# Patient Record
Sex: Female | Born: 1945 | Race: White | Hispanic: No | Marital: Married | Smoking: Never smoker
Health system: Southern US, Community
[De-identification: ages and names within clinical notes are randomized; demographics above are authoritative.]

## PROBLEM LIST (undated history)

## (undated) DIAGNOSIS — F32A Depression, unspecified: Secondary | ICD-10-CM

## (undated) DIAGNOSIS — Z87442 Personal history of urinary calculi: Secondary | ICD-10-CM

## (undated) DIAGNOSIS — I4891 Unspecified atrial fibrillation: Secondary | ICD-10-CM

## (undated) DIAGNOSIS — F329 Major depressive disorder, single episode, unspecified: Secondary | ICD-10-CM

## (undated) DIAGNOSIS — M8580 Other specified disorders of bone density and structure, unspecified site: Secondary | ICD-10-CM

## (undated) DIAGNOSIS — M169 Osteoarthritis of hip, unspecified: Secondary | ICD-10-CM

## (undated) DIAGNOSIS — N200 Calculus of kidney: Secondary | ICD-10-CM

## (undated) DIAGNOSIS — E559 Vitamin D deficiency, unspecified: Secondary | ICD-10-CM

## (undated) DIAGNOSIS — K219 Gastro-esophageal reflux disease without esophagitis: Secondary | ICD-10-CM

## (undated) DIAGNOSIS — I10 Essential (primary) hypertension: Secondary | ICD-10-CM

## (undated) DIAGNOSIS — B029 Zoster without complications: Secondary | ICD-10-CM

## (undated) DIAGNOSIS — D649 Anemia, unspecified: Secondary | ICD-10-CM

## (undated) DIAGNOSIS — K5790 Diverticulosis of intestine, part unspecified, without perforation or abscess without bleeding: Secondary | ICD-10-CM

## (undated) HISTORY — PX: KNEE SURGERY: SHX244

## (undated) HISTORY — DX: Other specified disorders of bone density and structure, unspecified site: M85.80

## (undated) HISTORY — PX: ABDOMINAL HYSTERECTOMY: SHX81

## (undated) HISTORY — DX: Zoster without complications: B02.9

## (undated) HISTORY — DX: Depression, unspecified: F32.A

## (undated) HISTORY — DX: Major depressive disorder, single episode, unspecified: F32.9

## (undated) HISTORY — DX: Vitamin D deficiency, unspecified: E55.9

## (undated) HISTORY — PX: KNEE ARTHROSCOPY WITH EXCISION BAKER'S CYST: SHX5646

## (undated) HISTORY — PX: OTHER SURGICAL HISTORY: SHX169

## (undated) HISTORY — DX: Calculus of kidney: N20.0

## (undated) HISTORY — DX: Diverticulosis of intestine, part unspecified, without perforation or abscess without bleeding: K57.90

## (undated) HISTORY — DX: Osteoarthritis of hip, unspecified: M16.9

---

## 1983-07-06 HISTORY — PX: BREAST REDUCTION SURGERY: SHX8

## 1999-12-01 ENCOUNTER — Encounter (INDEPENDENT_AMBULATORY_CARE_PROVIDER_SITE_OTHER): Payer: Self-pay | Admitting: Specialist

## 1999-12-01 ENCOUNTER — Other Ambulatory Visit: Admission: RE | Admit: 1999-12-01 | Discharge: 1999-12-01 | Payer: Self-pay | Admitting: *Deleted

## 1999-12-17 ENCOUNTER — Encounter (INDEPENDENT_AMBULATORY_CARE_PROVIDER_SITE_OTHER): Payer: Self-pay | Admitting: Specialist

## 1999-12-17 ENCOUNTER — Inpatient Hospital Stay (HOSPITAL_COMMUNITY): Admission: RE | Admit: 1999-12-17 | Discharge: 1999-12-19 | Payer: Self-pay | Admitting: *Deleted

## 2002-06-28 ENCOUNTER — Emergency Department (HOSPITAL_COMMUNITY): Admission: EM | Admit: 2002-06-28 | Discharge: 2002-06-29 | Payer: Self-pay | Admitting: Emergency Medicine

## 2003-07-06 HISTORY — PX: COLONOSCOPY: SHX174

## 2004-07-31 ENCOUNTER — Ambulatory Visit (HOSPITAL_COMMUNITY): Admission: RE | Admit: 2004-07-31 | Discharge: 2004-07-31 | Payer: Self-pay | Admitting: Gastroenterology

## 2005-10-04 ENCOUNTER — Emergency Department (HOSPITAL_COMMUNITY): Admission: EM | Admit: 2005-10-04 | Discharge: 2005-10-04 | Payer: Self-pay | Admitting: Emergency Medicine

## 2005-10-06 ENCOUNTER — Ambulatory Visit (HOSPITAL_BASED_OUTPATIENT_CLINIC_OR_DEPARTMENT_OTHER): Admission: RE | Admit: 2005-10-06 | Discharge: 2005-10-06 | Payer: Self-pay | Admitting: Urology

## 2005-10-07 ENCOUNTER — Ambulatory Visit (HOSPITAL_COMMUNITY): Admission: RE | Admit: 2005-10-07 | Discharge: 2005-10-07 | Payer: Self-pay | Admitting: Urology

## 2006-07-05 DIAGNOSIS — N200 Calculus of kidney: Secondary | ICD-10-CM

## 2006-07-05 HISTORY — PX: LITHOTRIPSY: SUR834

## 2006-07-05 HISTORY — DX: Calculus of kidney: N20.0

## 2008-10-24 ENCOUNTER — Encounter: Admission: RE | Admit: 2008-10-24 | Discharge: 2008-10-24 | Payer: Self-pay | Admitting: Family Medicine

## 2010-01-06 ENCOUNTER — Encounter: Admission: RE | Admit: 2010-01-06 | Discharge: 2010-01-06 | Payer: Self-pay | Admitting: Family Medicine

## 2010-11-20 NOTE — Op Note (Signed)
NAMEMATTI, KILLINGSWORTH            ACCOUNT NO.:  [id number]   MEDICAL RECORD NO.:  [id number]          PATIENT TYPE:  AMB   LOCATION:  DAY                          FACILITY:  [facility]   PHYSICIAN:  Bertram Millard. Dahlstedt, M.D.DATE OF BIRTH:  [birth year]   DATE OF PROCEDURE:  10/06/2005  DATE OF DISCHARGE:                                 OPERATIVE REPORT   PRE AND POSTOPERATIVE DIAGNOSES:  Left ureteral stone (upper ureter) with  significant pain, nausea, vomiting.   PRINCIPAL PROCEDURES:  Cystoscopy, left double-J stent.   SURGEON:  Bertram Millard. Dahlstedt, M.D.   ANESTHESIA:  General.   COMPLICATIONS:  None.   BRIEF HISTORY:  This 65 year old lady presented to me earlier this week with  about a month long history of intermittent left flank pain.  She has had  debilitating pain today.  She is scheduled for lithotripsy tomorrow but is  unable to keep food or fluids down.  It was recommended she undergo cysto  and stent placement to relieve her pain.  She presents emergently for this  procedure.   DESCRIPTION OF PROCEDURE:  The patient administered Demerol and Phenergan in  the office.  She was identified properly in the holding room, given  preoperative IV antibiotics and taken to the operating room where general  anesthetic was administered.  She was placed in the dorsal lithotomy  position.  Genitalia and perineum were prepped and draped.  A 22-French  panendoscope was placed into her bladder.  A sensor tip guidewire was then  advanced past the stone which was evident on fluoroscopy.  A double-J stent  (6 x 24 cm Contour stent) was then placed over the guidewire.  Good proximal  distal curls were seen after the guidewire was removed.  String was left  intact in the stent.  The bladder was drained, scope removed, the procedure  terminated.   The stent will be placed temporarily.  It will be removed and the near  future.  Was placed specifically to relieve the patient's pain, nausea  and  vomiting before her lithotripsy.      Bertram Millard. Dahlstedt, M.D.  Electronically Signed     SMD/MEDQ  D:  10/06/2005  T:  10/07/2005  Job:  [id number]

## 2010-11-20 NOTE — Discharge Summary (Signed)
[facility]  Patient:    Colleen Reynolds, Colleen Reynolds                   MRN: [id number] Adm. Date:  [id number] Disc. Date: [id number] Attending:  Donne Hazel                           Discharge Summary  HISTORY OF PRESENT ILLNESS:  For complete details of the history and physical, please see the clinic admission history and physical.  Briefly, the patient is  65 year old postmenopausal female admitted for total vaginal hysterectomy due to abnormal uterine bleeding causing severe anemia recently.  The patient was placed on Provera hormonal treatment for this with minimal success.  She wished to proceed with an operative cure.  Her admission is significant for anemia upon anemia. his has been explained to the patient.  Her admission hemoglobin is 7.9.  PAST MEDICAL AND SURGICAL HISTORY:  Please see the clinic admission history and  physical.  ADMITTING DIAGNOSES: 1. Abnormal uterine bleeding. 2. Postmenopausal. 3. History of deep venous thrombosis. 4. Anemia.  HOSPITAL COURSE:  The patient was admitted on the same day of surgery, December 17, 1999, where she underwent a total vaginal hysterectomy and bilateral salpingo-oophorectomy.  The surgery went well with 150 cc of blood loss.  The uterus was somewhat enlarged, consistent with uterine fibroids.  The ovaries were normal.  The surgery went well and no complications occurred.  Blood loss was 150 cc, as mentioned.  The patients postoperative course was uneventful.  Her hemoglobin did drop to 6.5, but she was relatively asymptomatic with this degree of anemia.  She was slowly  advanced to a regular diet and quickly resumed normal bowel and bladder function after her surgery.  The patient met all expected goals in the postoperative period and was discharged on postoperative day #2 in stable condition.  Her anemia and  postoperative recovery was discussed with her at length.  DISCHARGE  DIAGNOSED: 1. Abnormal uterine bleeding status post total vaginal hysterectomy and bilateral    salpingo-oophorectomy. 2. Anemia, stable but significant.  PLAN: 1. Nu-Iron 150, one p.o. b.i.d. 2. Prescription given for Tylox for pain. 3. Motrin p.r.n. 4. Routine postoperative instructions given. 5. The patient will follow up in one week in the office for a check up and we will    check her blood count at that time. DD:  01/05/00 TD:  01/05/00 Job: [id number]

## 2010-11-20 NOTE — Op Note (Signed)
Colleen Reynolds, HESLIN            ACCOUNT NO.:  [id number]   MEDICAL RECORD NO.:  [id number]          PATIENT TYPE:  AMB   LOCATION:  ENDO                         FACILITY:  [facility]   PHYSICIAN:  John C. Madilyn Fireman, M.D.    DATE OF BIRTH:  09/09/1945   DATE OF PROCEDURE:  07/31/2004  DATE OF DISCHARGE:                                 OPERATIVE REPORT   PROCEDURE:  Colonoscopy.   ENDOSCOPIST:  Everardo All. Madilyn Fireman, M.D.   INDICATIONS FOR PROCEDURE:  Average-risk colon cancer screening.   PROCEDURE:  The patient was placed in the left lateral decubitus position  and placed on the pulse monitor with continuous low-flow oxygen delivered by  nasal cannula. She was sedated 75 mcg IV fentanyl and 8 mg IV Versed. The  Olympus video colonoscope was inserted into the rectum and advanced to  cecum, confirmed by transillumination of McBurney's point and visualization  of ileocecal valve and appendiceal orifice. Prep was good. The cecum,  ascending, transverse, descending and sigmoid colon all appeared normal with  no masses, polyps, diverticula or other mucosal abnormalities. The rectum  likewise appeared normal. In retroflexed view, the anus revealed no obvious  internal hemorrhoids. The scope was then withdrawn and the patient returned  to the recovery room in stable condition. She tolerated the procedure well  and there were no immediate complications.   IMPRESSION:  Normal colonoscopy.   PLAN:  Next colon screening by sigmoidoscopy in 5 years      JCH/MEDQ  D:  07/31/2004  T:  07/31/2004  Job:  [id number]   cc:   Dellis Anes. Idell Pickles, M.D.  [address]  [place]  [place] [address]  Fax: [phone]

## 2010-11-20 NOTE — Op Note (Signed)
[facility]  Patient:    Colleen Reynolds, Colleen Reynolds                   MRN: [id number] Proc. Date: 12/17/99 Adm. Date:  [id number] Attending:  Donne Hazel                           Operative Report  PREOPERATIVE DIAGNOSIS: 1. Abnormal uterine bleeding. 2. Postmenopausal.  POSTOPERATIVE DIAGNOSIS: 1. Abnormal uterine bleeding. 2. Postmenopausal. 3. Uterine fibroids noted.  PROCEDURE:  Total vaginal hysterectomy with bilateral salpingo-oophorectomy.  SURGEON:  Willey Blade, M.D.  ASSISTANT:  Raynald Kemp, M.D.  ANESTHESIA:  General endotracheal.  ESTIMATED BLOOD LOSS:  150 cc.  COMPLICATIONS:  None.  FINDINGS:  At the time of hysterectomy, an enlarged uterus consistent with uterine fibroids were noted. This was about 6 weeks in size. The ovaries were visualized and noted to be normal postmenopausal, however, these were removed.  The surgery went without complications.  DESCRIPTION OF PROCEDURE:  The patient was taken to the operating room where an epidural anesthetic was administered. The patient was placed on the operating table in the dorsal lithotomy position, the perineum and vagina were prepped and draped in the usual sterile fashion with Betadine and sterile drapes. A weighted speculum was placed in the posterior fornix of the vagina and the anterior lip of the cervix was grasped with a Jacobs tenaculum. The posterior cul-de-sac was sharply entered. Next, the uterosacral ligaments were clamped bilaterally with curved Heaney clamps. The vascular pedicle was sharply created and suture ligated with a transfixing suture of #0 Vicryl. A circumferential incision was made around the anterior portion of the cervix and the bladder flap was bluntly and sharply created over the lower uterine segment. This was advanced cephalad. A curved Deaver retractor was placed behind the bladder to ensure its protecting during the procedure. Successive bites  were then carried up the body of the uterus using curved Heaney clamps. All vascular pedicles were carefully created close to the uterine body. All vascular pedicles were sharply created and suture ligated with #0 monocryl suture. This dissection was carried out including the uterine artery pedicles and dissection was carried out up the broad ligament. The uterine fundus was eventually encountered. The anterior cul-de-sac was entered sharply and once again a Deaver was placed and used to protect the bladder during the procedure.  The uterine fundus was then delivered posteriorly through the incision with a Lahey tenaculum. The uterine ovarian ligaments were clamped and the uterus dissected free. The ovaries were then removed in a systematic fashion. First the right ovary was removed, this was grasped with a Babcock clamp and with traction delivered into the incision. The infundibulopelvic ligament was then clamped with a curved Heaney clamp. The ovary dissected free. The infundibulopelvic ligament was doubly ligated first with a free tie of #0 monocryl. A transfixing suture of #0 monocryl was placed after this. The same procedure was repeated upon the right tube. All vascular pedicles were visualized and noted to be hemostatic. The posterior cuff of the vagina was run and closed with a running stitch of #0 monocryl.  Good hemostasis was once again noted. Attention was then turned to closure of the incision. The uterosacral ligaments were plicated in the midline with a transfixing suture of #0 Vicryl and this was done by grasping the left uterosacral and incorporating the posterior peritoneum overlying the rectal area and incorporating the right  uterosacral and then tying these together. This therefore obliterated the cul-de-sac. The two existing ligatures of #0 Vicryl on the uterosacral ligaments were then tied together to obliterate the posterior cul-de-sac and also for cuff support. The  vaginal cuff was then closed with multiple figure-of-eight sutures of #0 monocryl. Good hemostasis was noted. A Foley catheter was inserted with clear fluid and clear urine obtained. Blood loss was 150 cc. There were no perioperative complications. The patient did receive 1 gm of Cefotan prior to the procedure. She also received DVT prophylaxis in the form of PAS hose and heparin subcu. DD:  12/17/99 TD:  12/19/99 Job: [id number]

## 2014-12-17 ENCOUNTER — Other Ambulatory Visit: Payer: Self-pay | Admitting: Nurse Practitioner

## 2014-12-17 DIAGNOSIS — M810 Age-related osteoporosis without current pathological fracture: Secondary | ICD-10-CM

## 2014-12-17 DIAGNOSIS — M8580 Other specified disorders of bone density and structure, unspecified site: Secondary | ICD-10-CM

## 2014-12-24 ENCOUNTER — Ambulatory Visit
Admission: RE | Admit: 2014-12-24 | Discharge: 2014-12-24 | Disposition: A | Discharge status: Home / Self Care | Payer: Medicare Other | Source: Ambulatory Visit | Attending: Nurse Practitioner | Admitting: Nurse Practitioner

## 2014-12-24 DIAGNOSIS — M8580 Other specified disorders of bone density and structure, unspecified site: Secondary | ICD-10-CM

## 2016-09-15 DIAGNOSIS — R7303 Prediabetes: Secondary | ICD-10-CM | POA: Diagnosis not present

## 2016-09-15 DIAGNOSIS — F331 Major depressive disorder, recurrent, moderate: Secondary | ICD-10-CM | POA: Diagnosis not present

## 2016-09-15 DIAGNOSIS — E785 Hyperlipidemia, unspecified: Secondary | ICD-10-CM | POA: Diagnosis not present

## 2016-09-15 DIAGNOSIS — Z9189 Other specified personal risk factors, not elsewhere classified: Secondary | ICD-10-CM | POA: Diagnosis not present

## 2016-09-15 DIAGNOSIS — Z1212 Encounter for screening for malignant neoplasm of rectum: Secondary | ICD-10-CM | POA: Diagnosis not present

## 2016-09-15 DIAGNOSIS — E2839 Other primary ovarian failure: Secondary | ICD-10-CM | POA: Diagnosis not present

## 2016-09-15 DIAGNOSIS — Z8639 Personal history of other endocrine, nutritional and metabolic disease: Secondary | ICD-10-CM | POA: Diagnosis not present

## 2016-09-15 DIAGNOSIS — B0229 Other postherpetic nervous system involvement: Secondary | ICD-10-CM | POA: Diagnosis not present

## 2016-09-15 DIAGNOSIS — E559 Vitamin D deficiency, unspecified: Secondary | ICD-10-CM | POA: Diagnosis not present

## 2016-09-15 DIAGNOSIS — Z1231 Encounter for screening mammogram for malignant neoplasm of breast: Secondary | ICD-10-CM | POA: Diagnosis not present

## 2016-09-15 DIAGNOSIS — Z1211 Encounter for screening for malignant neoplasm of colon: Secondary | ICD-10-CM | POA: Diagnosis not present

## 2016-09-21 DIAGNOSIS — B0230 Zoster ocular disease, unspecified: Secondary | ICD-10-CM | POA: Diagnosis not present

## 2016-09-21 DIAGNOSIS — H2512 Age-related nuclear cataract, left eye: Secondary | ICD-10-CM | POA: Diagnosis not present

## 2016-09-21 DIAGNOSIS — H16302 Unspecified interstitial keratitis, left eye: Secondary | ICD-10-CM | POA: Diagnosis not present

## 2016-09-28 DIAGNOSIS — B0230 Zoster ocular disease, unspecified: Secondary | ICD-10-CM | POA: Diagnosis not present

## 2016-09-28 DIAGNOSIS — H16302 Unspecified interstitial keratitis, left eye: Secondary | ICD-10-CM | POA: Diagnosis not present

## 2016-10-04 DIAGNOSIS — Z1212 Encounter for screening for malignant neoplasm of rectum: Secondary | ICD-10-CM | POA: Diagnosis not present

## 2016-10-04 DIAGNOSIS — Z1211 Encounter for screening for malignant neoplasm of colon: Secondary | ICD-10-CM | POA: Diagnosis not present

## 2016-10-12 DIAGNOSIS — H16302 Unspecified interstitial keratitis, left eye: Secondary | ICD-10-CM | POA: Diagnosis not present

## 2016-10-15 ENCOUNTER — Encounter: Payer: Self-pay | Admitting: Gastroenterology

## 2016-10-20 DIAGNOSIS — Z9189 Other specified personal risk factors, not elsewhere classified: Secondary | ICD-10-CM | POA: Diagnosis not present

## 2016-10-20 DIAGNOSIS — E2839 Other primary ovarian failure: Secondary | ICD-10-CM | POA: Diagnosis not present

## 2016-10-20 DIAGNOSIS — M85852 Other specified disorders of bone density and structure, left thigh: Secondary | ICD-10-CM | POA: Diagnosis not present

## 2016-10-20 DIAGNOSIS — Z1231 Encounter for screening mammogram for malignant neoplasm of breast: Secondary | ICD-10-CM | POA: Diagnosis not present

## 2016-11-03 DIAGNOSIS — H16302 Unspecified interstitial keratitis, left eye: Secondary | ICD-10-CM | POA: Diagnosis not present

## 2016-11-05 ENCOUNTER — Ambulatory Visit (AMBULATORY_SURGERY_CENTER): Payer: Self-pay | Admitting: *Deleted

## 2016-11-05 VITALS — Ht 66.0 in | Wt 178.6 lb

## 2016-11-05 DIAGNOSIS — R195 Other fecal abnormalities: Secondary | ICD-10-CM

## 2016-11-05 MED ORDER — NA SULFATE-K SULFATE-MG SULF 17.5-3.13-1.6 GM/177ML PO SOLN: 1.0000 | Freq: Once | ORAL | 0 refills | Status: AC

## 2016-11-05 MED ORDER — NA SULFATE-K SULFATE-MG SULF 17.5-3.13-1.6 GM/177ML PO SOLN: 1.0000 | Freq: Once | ORAL | 0 refills | Status: DC

## 2016-11-05 NOTE — Progress Notes (Signed)
Denies allergies to eggs or soy products. Denies complications with sedation or anesthesia. Denies O2 use. Denies use of diet or weight loss medications.  Emmi instructions given for colonoscopy.  

## 2016-11-17 ENCOUNTER — Ambulatory Visit (AMBULATORY_SURGERY_CENTER): Payer: Medicare Other | Admitting: Gastroenterology

## 2016-11-17 ENCOUNTER — Encounter: Payer: Self-pay | Admitting: Gastroenterology

## 2016-11-17 VITALS — BP 112/72 | HR 94 | Temp 98.0°F | Resp 18 | Ht 66.0 in | Wt 178.0 lb

## 2016-11-17 DIAGNOSIS — D127 Benign neoplasm of rectosigmoid junction: Secondary | ICD-10-CM

## 2016-11-17 DIAGNOSIS — D122 Benign neoplasm of ascending colon: Secondary | ICD-10-CM

## 2016-11-17 DIAGNOSIS — Z1211 Encounter for screening for malignant neoplasm of colon: Secondary | ICD-10-CM | POA: Diagnosis not present

## 2016-11-17 DIAGNOSIS — R195 Other fecal abnormalities: Secondary | ICD-10-CM

## 2016-11-17 MED ORDER — SODIUM CHLORIDE 0.9 % IV SOLN: 500.0000 mL | INTRAVENOUS | Status: DC

## 2016-11-17 NOTE — Op Note (Signed)
[facility] Patient Name: Colleen Reynolds Procedure Date: 11/17/2016 9:12 AM MRN: [id number] Endoscopist: Mauri Pole , MD Age: 71 Referring MD:  Date of Birth: 09-24-1945 Gender: Female Account #: [id number] Procedure:                Colonoscopy Indications:              Positive Cologuard test Medicines:                Monitored Anesthesia Care Procedure:                Pre-Anesthesia Assessment:                           - Prior to the procedure, a History and Physical                            was performed, and patient medications and                            allergies were reviewed. The patient's tolerance of                            previous anesthesia was also reviewed. The risks                            and benefits of the procedure and the sedation                            options and risks were discussed with the patient.                            All questions were answered, and informed consent                            was obtained. Prior Anticoagulants: The patient has                            taken no previous anticoagulant or antiplatelet                            agents. ASA Grade Assessment: II - A patient with                            mild systemic disease. After reviewing the risks                            and benefits, the patient was deemed in                            satisfactory condition to undergo the procedure.                           After obtaining informed consent, the colonoscope  was passed under direct vision. Throughout the                            procedure, the patient's blood pressure, pulse, and                            oxygen saturations were monitored continuously. The                            Colonoscope was introduced through the anus and                            advanced to the the terminal ileum, with                            identification of the appendiceal  orifice and IC                            valve. The colonoscopy was performed without                            difficulty. The patient tolerated the procedure                            well. The quality of the bowel preparation was                            excellent. The ileocecal valve, appendiceal                            orifice, and rectum were photographed. Scope In: 9:20:29 AM Scope Out: 9:43:49 AM Scope Withdrawal Time: 0 hours 20 minutes 41 seconds  Total Procedure Duration: 0 hours 23 minutes 20 seconds  Findings:                 The perianal and digital rectal examinations were                            normal.                           Four sessile polyps were found in the ascending                            colon. The polyps were 1 to 3 mm in size. These                            polyps were removed with a cold biopsy forceps.                            Resection and retrieval were complete.                           A 18 mm polyp was found in the ascending colon. The  polyp was granular lateral spreading. The polyp was                            removed with a piecemeal technique using a cold                            snare. Resection and retrieval were complete.                           A 4 mm polyp was found in the recto-sigmoid colon.                            The polyp was sessile. The polyp was removed with a                            cold snare. Resection and retrieval were complete.                           Multiple small and large-mouthed diverticula were                            found in the sigmoid colon, descending colon,                            transverse colon and ascending colon.                           Non-bleeding internal hemorrhoids were found during                            retroflexion. The hemorrhoids were small. Complications:            No immediate complications. Estimated Blood Loss:     Estimated  blood loss was minimal. Impression:               - Four 1 to 3 mm polyps in the ascending colon,                            removed with a cold biopsy forceps. Resected and                            retrieved.                           - One 18 mm polyp in the ascending colon, removed                            piecemeal using a cold snare. Resected and                            retrieved.                           - One 4 mm polyp at the recto-sigmoid colon,  removed with a cold snare. Resected and retrieved.                           - Diverticulosis in the sigmoid colon, in the                            descending colon, in the transverse colon and in                            the ascending colon.                           - Non-bleeding internal hemorrhoids. Recommendation:           - Patient has a contact number available for                            emergencies. The signs and symptoms of potential                            delayed complications were discussed with the                            patient. Return to normal activities tomorrow.                            Written discharge instructions were provided to the                            patient.                           - Resume previous diet.                           - Continue present medications.                           - Await pathology results.                           - Repeat colonoscopy in 1 year for surveillance                            after piecemeal polypectomy. Mauri Pole, MD 11/17/2016 9:48:35 AM This report has been signed electronically.

## 2016-11-17 NOTE — Patient Instructions (Signed)
YOU HAD AN ENDOSCOPIC PROCEDURE TODAY AT [facility]:   Refer to the procedure report that was given to you for any specific questions about what was found during the examination.  If the procedure report does not answer your questions, please call your gastroenterologist to clarify.  If you requested that your care partner not be given the details of your procedure findings, then the procedure report has been included in a sealed envelope for you to review at your convenience later.  YOU SHOULD EXPECT: Some feelings of bloating in the abdomen. Passage of more gas than usual.  Walking can help get rid of the air that was put into your GI tract during the procedure and reduce the bloating. If you had a lower endoscopy (such as a colonoscopy or flexible sigmoidoscopy) you may notice spotting of blood in your stool or on the toilet paper. If you underwent a bowel prep for your procedure, you may not have a normal bowel movement for a few days.  Please Note:  You might notice some irritation and congestion in your nose or some drainage.  This is from the oxygen used during your procedure.  There is no need for concern and it should clear up in a day or so.  SYMPTOMS TO REPORT IMMEDIATELY:   Following lower endoscopy (colonoscopy or flexible sigmoidoscopy):  Excessive amounts of blood in the stool  Significant tenderness or worsening of abdominal pains  Swelling of the abdomen that is new, acute  Fever of 100F or higher   For urgent or emergent issues, a gastroenterologist can be reached at any hour by calling [phone].   DIET:  We do recommend a small meal at first, but then you may proceed to your regular diet.  Drink plenty of fluids but you should avoid alcoholic beverages for 24 hours.  ACTIVITY:  You should plan to take it easy for the rest of today and you should NOT DRIVE or use heavy machinery until tomorrow (because of the sedation medicines used during the test).     FOLLOW UP: Our staff will call the number listed on your records the next business day following your procedure to check on you and address any questions or concerns that you may have regarding the information given to you following your procedure. If we do not reach you, we will leave a message.  However, if you are feeling well and you are not experiencing any problems, there is no need to return our call.  We will assume that you have returned to your regular daily activities without incident.  If any biopsies were taken you will be contacted by phone or by letter within the next 1-3 weeks.  Please call us at [phone] if you have not heard about the biopsies in 3 weeks.    SIGNATURES/CONFIDENTIALITY: You and/or your care partner have signed paperwork which will be entered into your electronic medical record.  These signatures attest to the fact that that the information above on your After Visit Summary has been reviewed and is understood.  Full responsibility of the confidentiality of this discharge information lies with you and/or your care-partner.  Polyp, diverticulosis,  Hemorrhoid information given.  Recall colonoscopy 1 year-2019

## 2016-11-17 NOTE — Progress Notes (Signed)
Called to room to assist during endoscopic procedure.  Patient ID and intended procedure confirmed with present staff. Received instructions for my participation in the procedure from the performing physician.  

## 2016-11-17 NOTE — Progress Notes (Signed)
A and O x3. Report to RN. Tolerated MAC anesthesia well.

## 2016-11-18 ENCOUNTER — Telehealth: Payer: Self-pay | Admitting: *Deleted

## 2016-11-18 ENCOUNTER — Telehealth: Payer: Self-pay

## 2016-11-18 NOTE — Telephone Encounter (Signed)
  Follow up Call-  Call back number 11/17/2016  Post procedure Call Back phone  # [phone]  Permission to leave phone message Yes  Some recent data might be hidden     Patient questions:  Do you have a fever, pain , or abdominal swelling? No. Pain Score  0 *  Have you tolerated food without any problems? Yes.    Have you been able to return to your normal activities? Yes.    Do you have any questions about your discharge instructions: Diet   No. Medications  No. Follow up visit  No.  Do you have questions or concerns about your Care? No.  Actions: * If pain score is 4 or above: No action needed, pain <4.

## 2016-11-18 NOTE — Telephone Encounter (Signed)
  Follow up Call-  Call back number 11/17/2016  Post procedure Call Back phone  # [phone]  Permission to leave phone message Yes  Some recent data might be hidden     Left message

## 2016-11-23 ENCOUNTER — Encounter: Payer: Self-pay | Admitting: Gastroenterology

## 2016-12-22 DIAGNOSIS — B0230 Zoster ocular disease, unspecified: Secondary | ICD-10-CM | POA: Diagnosis not present

## 2016-12-22 DIAGNOSIS — H16302 Unspecified interstitial keratitis, left eye: Secondary | ICD-10-CM | POA: Diagnosis not present

## 2017-03-18 ENCOUNTER — Ambulatory Visit: Payer: Medicare Other | Admitting: Family Medicine

## 2017-03-25 ENCOUNTER — Ambulatory Visit (INDEPENDENT_AMBULATORY_CARE_PROVIDER_SITE_OTHER): Payer: Medicare Other | Admitting: Family Medicine

## 2017-03-25 ENCOUNTER — Encounter: Payer: Self-pay | Admitting: Family Medicine

## 2017-03-25 VITALS — BP 102/72 | HR 83 | Temp 98.8°F | Ht 69.8 in | Wt 176.4 lb

## 2017-03-25 DIAGNOSIS — Z23 Encounter for immunization: Secondary | ICD-10-CM | POA: Diagnosis not present

## 2017-03-25 DIAGNOSIS — H16302 Unspecified interstitial keratitis, left eye: Secondary | ICD-10-CM | POA: Diagnosis not present

## 2017-03-25 DIAGNOSIS — B0230 Zoster ocular disease, unspecified: Secondary | ICD-10-CM | POA: Diagnosis not present

## 2017-03-25 DIAGNOSIS — B0229 Other postherpetic nervous system involvement: Secondary | ICD-10-CM | POA: Diagnosis not present

## 2017-03-25 DIAGNOSIS — M85852 Other specified disorders of bone density and structure, left thigh: Secondary | ICD-10-CM

## 2017-03-25 DIAGNOSIS — M85851 Other specified disorders of bone density and structure, right thigh: Secondary | ICD-10-CM

## 2017-03-25 DIAGNOSIS — Z7689 Persons encountering health services in other specified circumstances: Secondary | ICD-10-CM

## 2017-03-25 DIAGNOSIS — F339 Major depressive disorder, recurrent, unspecified: Secondary | ICD-10-CM | POA: Diagnosis not present

## 2017-03-25 MED ORDER — FLUOXETINE HCL 40 MG PO CAPS: 40.0000 mg | ORAL_CAPSULE | Freq: Every day | ORAL | 3 refills | Status: DC

## 2017-03-25 NOTE — Patient Instructions (Addendum)
Dehydration, Adult Dehydration is a condition in which there is not enough fluid or water in the body. This happens when you lose more fluids than you take in. Important organs, such as the kidneys, brain, and heart, cannot function without a proper amount of fluids. Any loss of fluids from the body can lead to dehydration. Dehydration can range from mild to severe. This condition should be treated right away to prevent it from becoming severe. What are the causes? This condition may be caused by:  Vomiting.  Diarrhea.  Excessive sweating, such as from heat exposure or exercise.  Not drinking enough fluid, especially: ? When ill. ? While doing activity that requires a lot of energy.  Excessive urination.  Fever.  Infection.  Certain medicines, such as medicines that cause the body to lose excess fluid (diuretics).  Inability to access safe drinking water.  Reduced physical ability to get adequate water and food.  What increases the risk? This condition is more likely to develop in people:  Who have a poorly controlled long-term (chronic) illness, such as diabetes, heart disease, or kidney disease.  Who are age 65 or older.  Who are disabled.  Who live in a place with high altitude.  Who play endurance sports.  What are the signs or symptoms? Symptoms of mild dehydration may include:  Thirst.  Dry lips.  Slightly dry mouth.  Dry, warm skin.  Dizziness. Symptoms of moderate dehydration may include:  Very dry mouth.  Muscle cramps.  Dark urine. Urine may be the color of tea.  Decreased urine production.  Decreased tear production.  Heartbeat that is irregular or faster than normal (palpitations).  Headache.  Light-headedness, especially when you stand up from a sitting position.  Fainting (syncope). Symptoms of severe dehydration may include:  Changes in skin, such as: ? Cold and clammy skin. ? Blotchy (mottled) or pale skin. ? Skin that does  not quickly return to normal after being lightly pinched and released (poor skin turgor).  Changes in body fluids, such as: ? Extreme thirst. ? No tear production. ? Inability to sweat when body temperature is high, such as in hot weather. ? Very little urine production.  Changes in vital signs, such as: ? Weak pulse. ? Pulse that is more than 100 beats a minute when sitting still. ? Rapid breathing. ? Low blood pressure.  Other changes, such as: ? Sunken eyes. ? Cold hands and feet. ? Confusion. ? Lack of energy (lethargy). ? Difficulty waking up from sleep. ? Short-term weight loss. ? Unconsciousness. How is this diagnosed? This condition is diagnosed based on your symptoms and a physical exam. Blood and urine tests may be done to help confirm the diagnosis. How is this treated? Treatment for this condition depends on the severity. Mild or moderate dehydration can often be treated at home. Treatment should be started right away. Do not wait until dehydration becomes severe. Severe dehydration is an emergency and it needs to be treated in a hospital. Treatment for mild dehydration may include:  Drinking more fluids.  Replacing salts and minerals in your blood (electrolytes) that you may have lost. Treatment for moderate dehydration may include:  Drinking an oral rehydration solution (ORS). This is a drink that helps you replace fluids and electrolytes (rehydrate). It can be found at pharmacies and retail stores. Treatment for severe dehydration may include:  Receiving fluids through an IV tube.  Receiving an electrolyte solution through a feeding tube that is passed through your nose   and into your stomach (nasogastric tube, or NG tube).  Correcting any abnormalities in electrolytes.  Treating the underlying cause of dehydration. Follow these instructions at home:  If directed by your health care provider, drink an ORS: ? Make an ORS by following instructions on the  package. ? Start by drinking small amounts, about  cup (120 mL) every 5-10 minutes. ? Slowly increase how much you drink until you have taken the amount recommended by your health care provider.  Drink enough clear fluid to keep your urine clear or pale yellow. If you were told to drink an ORS, finish the ORS first, then start slowly drinking other clear fluids. Drink fluids such as: ? Water. Do not drink only water. Doing that can lead to having too little salt (sodium) in the body (hyponatremia). ? Ice chips. ? Fruit juice that you have added water to (diluted fruit juice). ? Low-calorie sports drinks.  Avoid: ? Alcohol. ? Drinks that contain a lot of sugar. These include high-calorie sports drinks, fruit juice that is not diluted, and soda. ? Caffeine. ? Foods that are greasy or contain a lot of fat or sugar.  Take over-the-counter and prescription medicines only as told by your health care provider.  Do not take sodium tablets. This can lead to having too much sodium in the body (hypernatremia).  Eat foods that contain a healthy balance of electrolytes, such as bananas, oranges, potatoes, tomatoes, and spinach.  Keep all follow-up visits as told by your health care provider. This is important. Contact a health care provider if:  You have abdominal pain that: ? Gets worse. ? Stays in one area (localizes).  You have a rash.  You have a stiff neck.  You are more irritable than usual.  You are sleepier or more difficult to wake up than usual.  You feel weak or dizzy.  You feel very thirsty.  You have urinated only a small amount of very dark urine over 6-8 hours. Get help right away if:  You have symptoms of severe dehydration.  You cannot drink fluids without vomiting.  Your symptoms get worse with treatment.  You have a fever.  You have a severe headache.  You have vomiting or diarrhea that: ? Gets worse. ? Does not go away.  You have blood or green matter  (bile) in your vomit.  You have blood in your stool. This may cause stool to look black and tarry.  You have not urinated in 6-8 hours.  You faint.  Your heart rate while sitting still is over 100 beats a minute.  You have trouble breathing. This information is not intended to replace advice given to you by your health care provider. Make sure you discuss any questions you have with your health care provider. Document Released: 06/21/2005 Document Revised: 01/16/2016 Document Reviewed: 08/15/2015 Elsevier Interactive Patient Education  2018 [employer].  Preventing Osteoporosis, Adult Osteoporosis is a condition that causes the bones to get weaker. With osteoporosis, the bones become thinner, and the normal spaces in bone tissue become larger. This can make the bones weak and cause them to break more easily. People who have osteoporosis are more likely to break their wrist, spine, or hip. Even a minor accident or injury can be enough to break weak bones. Osteoporosis can occur with aging. Your body constantly replaces old bone tissue with new tissue. As you get older, you may lose bone tissue more quickly, or it may be replaced more slowly. Osteoporosis is  more likely to develop if you have poor nutrition or do not get enough calcium or vitamin D. Other lifestyle factors can also play a role. By making some diet and lifestyle changes, you can help to keep your bones healthy and help to prevent osteoporosis. What nutrition changes can be made? Nutrition plays an important role in maintaining healthy, strong bones.  Make sure you get enough calcium every day from food or from calcium supplements. ? If you are age 12 or younger, aim to get 1,000 mg of calcium every day. ? If you are older than age 34, aim to get 1,200 mg of calcium every day.  Try to get enough vitamin D every day. ? If you are age 4 or younger, aim to get 600 international units (IU) every day. ? If you are older than age  92, aim to get 800 international units every day.  Follow a healthy diet. Eat plenty of foods that contain calcium and vitamin D. ? Calcium is in milk, cheese, yogurt, and other dairy products. Some fish and vegetables are also good sources of calcium. Many foods such as cereals and breads have had calcium added to them (are fortified). Check nutrition labels to see how much calcium is in a food or drink. ? Foods that contain vitamin D include milk, cereals, salmon, and tuna. Your body also makes vitamin D when you are out in the sun. Bare skin exposure to the sun on your face, arms, legs, or back for no more than 30 minutes a day, 2 times per week is more than enough. Beyond that, it is important to use sunblock to protect your skin from sunburn, which increases your risk for skin cancer.  What lifestyle changes can be made? Making changes in your everyday life can also play an important role in preventing osteoporosis.  Stay active and get exercise every day. Ask your health care provider what types of exercise are best for you.  Do not use any products that contain nicotine or tobacco, such as cigarettes and e-cigarettes. If you need help quitting, ask your health care provider.  Limit alcohol intake to no more than 1 drink a day for nonpregnant women and 2 drinks a day for men. One drink equals 12 oz of beer, 5 oz of wine, or 1 oz of hard liquor.  Why are these changes important? Making these nutrition and lifestyle changes can:  Help you develop and maintain healthy, strong bones.  Prevent loss of bone mass and the problems that are caused by that loss, such as broken bones and delayed healing.  Make you feel better mentally and physically.  What can happen if changes are not made? Problems that can result from osteoporosis can be very serious. These may include:  A higher risk of broken bones that are painful and do not heal well.  Physical malformations, such as a collapsed spine  or a hunched back.  Problems with movement.  Where to find support: If you need help making changes to prevent osteoporosis, talk with your health care provider. You can ask for a referral to a diet and nutrition specialist (dietitian) and a physical therapist. Where to find more information: Learn more about osteoporosis from:  NIH Osteoporosis and Related [place]: www.niams.GolfingGoddess.com.br  U.S. Office on Women's Health: SouvenirBaseball.es.html  National Osteoporosis Foundation: ProfilePeek.ch  Summary  Osteoporosis is a condition that causes weak bones that are more likely to break.  Eating a healthy diet  and making sure you get enough calcium and vitamin D can help prevent osteoporosis.  Other ways to reduce your risk of osteoporosis include getting regular exercise and avoiding alcohol and products that contain nicotine or tobacco. This information is not intended to replace advice given to you by your health care provider. Make sure you discuss any questions you have with your health care provider. Document Released: 07/06/2015 Document Revised: 03/01/2016 Document Reviewed: 03/01/2016 Elsevier Interactive Patient Education  [employer].

## 2017-03-25 NOTE — Progress Notes (Signed)
Patient presents to clinic today to establish care and f/u on chronic issues.  SUBJECTIVE: PMH: Pt is a 71 yo CF with pmh sig for osteopenia, post herpetic neuralgia, b/l hip pain, depression.  Pt was formerly seen by [facility] O[facility] on [place] by a provider who recently left.   Osteopenia: -recent bone density -on fosamax 35 mg weekly -also taking calcium, glucosamine plus vit D -typically exercises, but hasn't recently and notices a difference.  Depression: -On Prozac since 1998.  Now taking 60mg  daily  -Pt endorses mood being fine, but decreased energy. -states had a "rough couple of years", but is doing better. -Interested in decreasing dose of Prozac to 40mg .  Post herpetic neuralgia: -Dx'd w/ shingles in July 2017. -Was on L forehead and in L eye -Pt followed by Dr. Hillery Hunter, Optho.  Last appt was today.  Pt d/c'd. -Pt had a scar on L cornea causing some decreased vision and light sensitivity.  Allergies: Statin (lovastatin)-tolerate it at first. Then had decreased interest, fatigue Penicillin-rash Clindamycin-vomiting  Surgical history: 1967 right knee patellar tendon tack Tonsillectomy Tubal ligation 2001 complete hysterectomy 2008 lithotripsy Breast reduction  Social history: Patient has been married for 40 years. She has 2 kids, one grandkid. One child lives here in the area. Patient denies tobacco, alcohol, drug use.  Health Maintenance: Dental --2 years ago, [facility] --today John Scott/Christopher Manuella Ghazi Immunizations --Prevnar December 2017, influenza 2018 Colonoscopy --done in April/May 2018, 6 benign polyps removed-repeat in one year Mammogram --last spring PAP -- complete hysterectomy in 2001. Bone Density -- recent-history of osteopenia  Family medical history: Mom-deceased, arthritis Dad-deceased CVA, osteoporosis, psoriasis Sis Charlotte-AAW, 75 mononucleosis, osteoporosis Sis Anne- desc. Obese, HPV in throat Sis Romie Minus- AAW,  osteoporosis Sister Mable-AAW Daughter Meredith-migraines, anxiety Daughter Melanie-AAW MGM-dementia, arthritis MGF-MI PGM- CVA, dementia PGF-liver issues possible cancer  Past Medical History:  Diagnosis Date  . Depression   . Diverticulosis   . Kidney stones 2008  . OA (osteoarthritis) of hip   . Osteopenia   . Shingles   . Vitamin D deficiency     Past Surgical History:  Procedure Laterality Date  . ABDOMINAL HYSTERECTOMY    . BREAST REDUCTION SURGERY  1985  . COLONOSCOPY  2005  . LITHOTRIPSY  2008    Current Outpatient Prescriptions on File Prior to Visit  Medication Sig Dispense Refill  . acyclovir (ZOVIRAX) 800 MG tablet Take 1,000 mg by mouth daily.    Marland Kitchen alendronate (FOSAMAX) 35 MG tablet 1 TAB EVERY 7 DAYS. IN A.M. W/FULL GLASS OF WATER,EMPTY STOMACH, DO NOT EAT/DRINK/LAY DOWN FOR 30MIN    . CALCIUM PO Take 400 mg by mouth 2 (two) times daily.    Marland Kitchen FAMOTIDINE PO Take by mouth.    . fexofenadine (ALLEGRA) 180 MG tablet Take by mouth.    Marland Kitchen FLUoxetine (PROZAC) 20 MG capsule Take by mouth.    . prednisoLONE acetate (PREDNISOLONE ACETATE P-F) 1 % ophthalmic suspension Place 1 drop into the left eye daily.    . Red Yeast Rice 600 MG CAPS Take 1,200 mg by mouth.    . cyclobenzaprine (FLEXERIL) 5 MG tablet Take 5 mg by mouth 3 (three) times daily as needed for muscle spasms.     Current Facility-Administered Medications on File Prior to Visit  Medication Dose Route Frequency Provider Last Rate Last Dose  . 0.9 %  sodium chloride infusion  500 mL Intravenous Continuous Nandigam, Venia Minks, MD  Allergies  Allergen Reactions  . Clindamycin/Lincomycin Nausea And Vomiting  . Lovastatin Other (See Comments)    Fatigue, muscle aches  . Penicillins     Family History  Problem Relation Age of Onset  . Colon cancer Neg Hx   . Esophageal cancer Neg Hx   . Rectal cancer Neg Hx   . Stomach cancer Neg Hx     Social History   Social History  . Marital status:  Married    Spouse name: N/A  . Number of children: N/A  . Years of education: N/A   Occupational History  . Not on file.   Social History Main Topics  . Smoking status: Never Smoker  . Smokeless tobacco: Never Used  . Alcohol use Yes     Comment: socially  . Drug use: No  . Sexual activity: Not on file   Other Topics Concern  . Not on file   Social History Narrative  . No narrative on file    ROS  General: Denies fever, chills, night sweats, changes in weight, changes in appetite HEENT: Denies headaches, ear pain, changes in vision, rhinorrhea, sore throat CV: Denies CP, palpitations, SOB, orthopnea Pulm: Denies SOB, cough, wheezing GI: Denies abdominal pain, nausea, vomiting, diarrhea, constipation GU: Denies dysuria, hematuria, frequency, vaginal discharge  +h/o kidney stones. Msk: Denies muscle cramps, joint pains Neuro: Denies weakness, numbness, tingling  +pain in face s/p shingles, b/l hip pain. Skin: Denies rashes, bruising Psych: Denies depression, anxiety, hallucinations   BP 102/72 (BP Location: Right Arm, Patient Position: Sitting, Cuff Size: Normal)   Pulse 83   Temp 98.8 F (37.1 C) (Oral)   Ht 5' 9.8" (1.773 m)   Wt 176 lb 6.4 oz (80 kg)   BMI 25.46 kg/m   Physical Exam  Gen. Pleasant, well developed, well-nourished, in NAD HEENT - [place]/AT, PERRL, EOMI, no scleral icterus, no nasal drainage, pharynx without erythema or exudate. Neck: No JVD, no thyromegaly, no carotid bruits Lungs: no accessory muscle use, CTAB, no wheezes, rales or rhonchi Cardiovascular: RRR, No r/g/m, no peripheral edema Abdomen: BS present, soft, nontender,nondistended, no hepatosplenomegaly Musculoskeletal: No deformities, moves all four extremities, no cyanosis or clubbing, normal tone Neuro:  A&Ox3, CN II-XII intact, normal gait Skin:  Warm, dry, intact, no lesions Psych: normal affect, mood appropriate  No results found for this or any previous visit (from the past 2160  hour(s)).  Assessment/Plan: Depression, recurrent ([place]) -currently stable -Given PHQ9 - Plan: FLUoxetine (PROZAC) 40 MG capsule  Post herpetic neuralgia -Continue acyclovir indefinitely per ophthalmology -Followed by Dr. Jenny Reichmann Scott/Christopher Manuella Ghazi no follow-up needed per appointment today -Continue wearing sunglasses when outdoors.  Osteopenia of both hips -Continue daily intake of dairy -Continue Fosamax 35 mg weekly -Continue glucosamine-vitamin D and calcium  Need for influenza vaccination  - Plan: Flu vaccine HIGH DOSE PF  Encounter to establish care -Records release -Patient arrives copy of recent lab was scanned into chart. CBC and CMP normal.  Hemoglobin A1c 5.4% and vitamin D 32.2 results from 09/15/2016.   Follow-up prn.  F/u in 3 months for depression.

## 2017-06-02 DIAGNOSIS — H2511 Age-related nuclear cataract, right eye: Secondary | ICD-10-CM | POA: Diagnosis not present

## 2017-06-03 ENCOUNTER — Encounter: Payer: Self-pay | Admitting: Family Medicine

## 2017-06-06 ENCOUNTER — Encounter: Payer: Self-pay | Admitting: Family Medicine

## 2017-06-08 DIAGNOSIS — H43813 Vitreous degeneration, bilateral: Secondary | ICD-10-CM | POA: Diagnosis not present

## 2017-09-19 DIAGNOSIS — Z0100 Encounter for examination of eyes and vision without abnormal findings: Secondary | ICD-10-CM | POA: Diagnosis not present

## 2017-09-19 DIAGNOSIS — Z0101 Encounter for examination of eyes and vision with abnormal findings: Secondary | ICD-10-CM | POA: Diagnosis not present

## 2017-10-06 ENCOUNTER — Encounter: Payer: Self-pay | Admitting: Family Medicine

## 2017-10-06 ENCOUNTER — Ambulatory Visit (INDEPENDENT_AMBULATORY_CARE_PROVIDER_SITE_OTHER): Payer: Medicare HMO | Admitting: Family Medicine

## 2017-10-06 VITALS — BP 124/76 | HR 136 | Temp 98.4°F | Wt 182.2 lb

## 2017-10-06 DIAGNOSIS — R000 Tachycardia, unspecified: Secondary | ICD-10-CM

## 2017-10-06 DIAGNOSIS — J45909 Unspecified asthma, uncomplicated: Secondary | ICD-10-CM | POA: Diagnosis not present

## 2017-10-06 MED ORDER — FLUTICASONE PROPIONATE 50 MCG/ACT NA SUSP: 1.0000 | Freq: Every day | NASAL | 1 refills | Status: DC

## 2017-10-06 MED ORDER — PREDNISONE 20 MG PO TABS: 40.0000 mg | ORAL_TABLET | Freq: Every day | ORAL | 0 refills | Status: AC

## 2017-10-06 MED ORDER — ALBUTEROL SULFATE HFA 108 (90 BASE) MCG/ACT IN AERS: 2.0000 | INHALATION_SPRAY | Freq: Four times a day (QID) | RESPIRATORY_TRACT | 0 refills | Status: DC | PRN

## 2017-10-06 NOTE — Progress Notes (Signed)
Subjective:    Patient ID: Colleen Reynolds, female    DOB: [birth year], 72 y.o.   MRN: [id number]  Chief Complaint  Patient presents with  . Cough    HPI Patient was seen today for acute concern.  Pt states while doing yard work she was blowing debris underneath the deck when dust and dirt blew up in her face.  Since then pt endorses 6 days of cough, headache, subjective fever (now resolved). Pt now feels like she is having more congestion and difficulty sleeping 2/2 cough.   Pt has not taken anything for her symptoms.    Past Medical History:  Diagnosis Date  . Depression   . Diverticulosis   . Kidney stones 2008  . OA (osteoarthritis) of hip   . Osteopenia   . Shingles   . Vitamin D deficiency     Allergies  Allergen Reactions  . Clindamycin/Lincomycin Nausea And Vomiting  . Lovastatin Other (See Comments)    Fatigue, muscle aches  . Penicillins     ROS General: Denies fever, chills, night sweats, changes in weight, changes in appetite HEENT: Denies headaches, ear pain, changes in vision, rhinorrhea, sore throat  +nasal congestion CV: Denies CP, palpitations, SOB, orthopnea Pulm: Denies SOB, wheezing  +cough, congestion GI: Denies abdominal pain, nausea, vomiting, diarrhea, constipation GU: Denies dysuria, hematuria, frequency, vaginal discharge Msk: Denies muscle cramps, joint pains Neuro: Denies weakness, numbness, tingling Skin: Denies rashes, bruising Psych: Denies depression, anxiety, hallucinations     Objective:    Blood pressure 124/76, pulse (!) 136, temperature 98.4 F (36.9 C), temperature source Oral, weight 182 lb 3.2 oz (82.6 kg), SpO2 96 %.   Gen. Pleasant, well-nourished, in no distress, normal affect   HEENT: [place]/AT, face symmetric, no scleral icterus, PERRLA, nares patent without drainage, pharynx without erythema or exudate.  TMs full bilaterally.  No cervical lymphadenopathy. Lungs: Productive sounding cough, no accessory muscle use, scattered  wheezes, no rales Cardiovascular: tachycardia, no m/r/g, no peripheral edema Abdomen: BS present, soft, NT/ND Neuro:  A&Ox3, CN II-XII intact, normal gait    Wt Readings from Last 3 Encounters:  10/06/17 182 lb 3.2 oz (82.6 kg)  03/25/17 176 lb 6.4 oz (80 kg)  11/17/16 178 lb (80.7 kg)    No results found for: WBC, HGB, HCT, PLT, GLUCOSE, CHOL, TRIG, HDL, LDLDIRECT, LDLCALC, ALT, AST, NA, K, CL, CREATININE, BUN, CO2, TSH, PSA, INR, GLUF, HGBA1C, MICROALBUR  Assessment/Plan:  Bronchitis, allergic, unspecified asthma severity, uncomplicated -Discussed dust/debris likely cause irritation to patient's lungs. -Discussed continuing fexofenadine daily -We will also start prednisone burst, Flonase, albuterol inhaler as needed -Patient given RTC or ED precautions for worsening symptoms -Discussed likely disease course -Given handout.  Patient advised to wear a mask while doing yard work  - Plan: fluticasone (FLONASE) 50 MCG/ACT nasal spray, predniSONE (DELTASONE) 20 MG tablet, albuterol (PROVENTIL HFA;VENTOLIN HFA) 108 (90 Base) MCG/ACT inhaler  Tachycardia -likely 2/2 active coughing.  Follow-up PRN  Grier Mitts, MD

## 2017-10-06 NOTE — Patient Instructions (Addendum)
Continue taking your daily allergy medication, fexofenadine.  A prescription has been sent to your pharmacy for prednisone, albuterol inhaler, and Flonase nasal spray.  When using your albuterol inhaler remember to wait a full 2 minutes between puffs.  You can use your inhaler every 6 hours as needed.  Be aware that for some people albuterol makes him feel jittery. Acute Bronchitis, Adult Acute bronchitis is sudden (acute) swelling of the air tubes (bronchi) in the lungs. Acute bronchitis causes these tubes to fill with mucus, which can make it hard to breathe. It can also cause coughing or wheezing. In adults, acute bronchitis usually goes away within 2 weeks. A cough caused by bronchitis may last up to 3 weeks. Smoking, allergies, and asthma can make the condition worse. Repeated episodes of bronchitis may cause further lung problems, such as chronic obstructive pulmonary disease (COPD). What are the causes? This condition can be caused by germs and by substances that irritate the lungs, including:  Cold and flu viruses. This condition is most often caused by the same virus that causes a cold.  Bacteria.  Exposure to tobacco smoke, dust, fumes, and air pollution.  What increases the risk? This condition is more likely to develop in people who:  Have close contact with someone with acute bronchitis.  Are exposed to lung irritants, such as tobacco smoke, dust, fumes, and vapors.  Have a weak immune system.  Have a respiratory condition such as asthma.  What are the signs or symptoms? Symptoms of this condition include:  A cough.  Coughing up clear, yellow, or green mucus.  Wheezing.  Chest congestion.  Shortness of breath.  A fever.  Body aches.  Chills.  A sore throat.  How is this diagnosed? This condition is usually diagnosed with a physical exam. During the exam, your health care provider may order tests, such as chest X-rays, to rule out other conditions. He or she  may also:  Test a sample of your mucus for bacterial infection.  Check the level of oxygen in your blood. This is done to check for pneumonia.  Do a chest X-ray or lung function testing to rule out pneumonia and other conditions.  Perform blood tests.  Your health care provider will also ask about your symptoms and medical history. How is this treated? Most cases of acute bronchitis clear up over time without treatment. Your health care provider may recommend:  Drinking more fluids. Drinking more makes your mucus thinner, which may make it easier to breathe.  Taking a medicine for a fever or cough.  Taking an antibiotic medicine.  Using an inhaler to help improve shortness of breath and to control a cough.  Using a cool mist vaporizer or humidifier to make it easier to breathe.  Follow these instructions at home: Medicines  Take over-the-counter and prescription medicines only as told by your health care provider.  If you were prescribed an antibiotic, take it as told by your health care provider. Do not stop taking the antibiotic even if you start to feel better. General instructions  Get plenty of rest.  Drink enough fluids to keep your urine clear or pale yellow.  Avoid smoking and secondhand smoke. Exposure to cigarette smoke or irritating chemicals will make bronchitis worse. If you smoke and you need help quitting, ask your health care provider. Quitting smoking will help your lungs heal faster.  Use an inhaler, cool mist vaporizer, or humidifier as told by your health care provider.  Keep all  follow-up visits as told by your health care provider. This is important. How is this prevented? To lower your risk of getting this condition again:  Wash your hands often with soap and water. If soap and water are not available, use hand sanitizer.  Avoid contact with people who have cold symptoms.  Try not to touch your hands to your mouth, nose, or eyes.  Make sure to  get the flu shot every year.  Contact a health care provider if:  Your symptoms do not improve in 2 weeks of treatment. Get help right away if:  You cough up blood.  You have chest pain.  You have severe shortness of breath.  You become dehydrated.  You faint or keep feeling like you are going to faint.  You keep vomiting.  You have a severe headache.  Your fever or chills gets worse. This information is not intended to replace advice given to you by your health care provider. Make sure you discuss any questions you have with your health care provider. Document Released: 07/29/2004 Document Revised: 01/14/2016 Document Reviewed: 12/10/2015 Elsevier Interactive Patient Education  [employer].

## 2017-11-06 ENCOUNTER — Encounter: Payer: Self-pay | Admitting: Gastroenterology

## 2018-02-28 ENCOUNTER — Other Ambulatory Visit: Payer: Self-pay | Admitting: Family Medicine

## 2018-03-10 DIAGNOSIS — H16302 Unspecified interstitial keratitis, left eye: Secondary | ICD-10-CM | POA: Diagnosis not present

## 2018-03-11 DIAGNOSIS — Z7983 Long term (current) use of bisphosphonates: Secondary | ICD-10-CM | POA: Diagnosis not present

## 2018-03-11 DIAGNOSIS — B029 Zoster without complications: Secondary | ICD-10-CM | POA: Diagnosis not present

## 2018-03-11 DIAGNOSIS — M810 Age-related osteoporosis without current pathological fracture: Secondary | ICD-10-CM | POA: Diagnosis not present

## 2018-03-11 DIAGNOSIS — R69 Illness, unspecified: Secondary | ICD-10-CM | POA: Diagnosis not present

## 2018-03-11 DIAGNOSIS — Z880 Allergy status to penicillin: Secondary | ICD-10-CM | POA: Diagnosis not present

## 2018-03-11 DIAGNOSIS — B009 Herpesviral infection, unspecified: Secondary | ICD-10-CM | POA: Diagnosis not present

## 2018-03-16 DIAGNOSIS — B0230 Zoster ocular disease, unspecified: Secondary | ICD-10-CM | POA: Diagnosis not present

## 2018-03-22 ENCOUNTER — Encounter: Payer: Self-pay | Admitting: Family Medicine

## 2018-03-22 ENCOUNTER — Ambulatory Visit (INDEPENDENT_AMBULATORY_CARE_PROVIDER_SITE_OTHER): Payer: Medicare HMO | Admitting: Family Medicine

## 2018-03-22 VITALS — BP 110/78 | HR 96 | Temp 97.5°F | Ht 65.0 in | Wt 181.0 lb

## 2018-03-22 DIAGNOSIS — Z0000 Encounter for general adult medical examination without abnormal findings: Secondary | ICD-10-CM | POA: Diagnosis not present

## 2018-03-22 DIAGNOSIS — R69 Illness, unspecified: Secondary | ICD-10-CM | POA: Diagnosis not present

## 2018-03-22 DIAGNOSIS — F339 Major depressive disorder, recurrent, unspecified: Secondary | ICD-10-CM | POA: Diagnosis not present

## 2018-03-22 DIAGNOSIS — Z13220 Encounter for screening for lipoid disorders: Secondary | ICD-10-CM | POA: Diagnosis not present

## 2018-03-22 DIAGNOSIS — Z23 Encounter for immunization: Secondary | ICD-10-CM

## 2018-03-22 MED ORDER — FLUOXETINE HCL 40 MG PO CAPS: 40.0000 mg | ORAL_CAPSULE | Freq: Every day | ORAL | 3 refills | Status: DC

## 2018-03-22 NOTE — Patient Instructions (Signed)
Preventive Care 70 Years and Older, Female Preventive care refers to lifestyle choices and visits with your health care provider that can promote health and wellness. What does preventive care include?  A yearly physical exam. This is also called an annual well check.  Dental exams once or twice a year.  Routine eye exams. Ask your health care provider how often you should have your eyes checked.  Personal lifestyle choices, including: ? Daily care of your teeth and gums. ? Regular physical activity. ? Eating a healthy diet. ? Avoiding tobacco and drug use. ? Limiting alcohol use. ? Practicing safe sex. ? Taking low-dose aspirin every day. ? Taking vitamin and mineral supplements as recommended by your health care provider. What happens during an annual well check? The services and screenings done by your health care provider during your annual well check will depend on your age, overall health, lifestyle risk factors, and family history of disease. Counseling Your health care provider may ask you questions about your:  Alcohol use.  Tobacco use.  Drug use.  Emotional well-being.  Home and relationship well-being.  Sexual activity.  Eating habits.  History of falls.  Memory and ability to understand (cognition).  Work and work environment.  Reproductive health.  Screening You may have the following tests or measurements:  Height, weight, and BMI.  Blood pressure.  Lipid and cholesterol levels. These may be checked every 5 years, or more frequently if you are over 50 years old.  Skin check.  Lung cancer screening. You may have this screening every year starting at age 55 if you have a 30-pack-year history of smoking and currently smoke or have quit within the past 15 years.  Fecal occult blood test (FOBT) of the stool. You may have this test every year starting at age 50.  Flexible sigmoidoscopy or colonoscopy. You may have a sigmoidoscopy every 5 years or  a colonoscopy every 10 years starting at age 50.  Hepatitis C blood test.  Hepatitis B blood test.  Sexually transmitted disease (STD) testing.  Diabetes screening. This is done by checking your blood sugar (glucose) after you have not eaten for a while (fasting). You may have this done every 1-3 years.  Bone density scan. This is done to screen for osteoporosis. You may have this done starting at age 70.  Mammogram. This may be done every 1-2 years. Talk to your health care provider about how often you should have regular mammograms.  Talk with your health care provider about your test results, treatment options, and if necessary, the need for more tests. Vaccines Your health care provider may recommend certain vaccines, such as:  Influenza vaccine. This is recommended every year.  Tetanus, diphtheria, and acellular pertussis (Tdap, Td) vaccine. You may need a Td booster every 10 years.  Varicella vaccine. You may need this if you have not been vaccinated.  Zoster vaccine. You may need this after age 60.  Measles, mumps, and rubella (MMR) vaccine. You may need at least one dose of MMR if you were born in 1957 or later. You may also need a second dose.  Pneumococcal 13-valent conjugate (PCV13) vaccine. One dose is recommended after age 70.  Pneumococcal polysaccharide (PPSV23) vaccine. One dose is recommended after age 70.  Meningococcal vaccine. You may need this if you have certain conditions.  Hepatitis A vaccine. You may need this if you have certain conditions or if you travel or work in places where you may be exposed to hepatitis   A.  Hepatitis B vaccine. You may need this if you have certain conditions or if you travel or work in places where you may be exposed to hepatitis B.  Haemophilus influenzae type b (Hib) vaccine. You may need this if you have certain conditions.  Talk to your health care provider about which screenings and vaccines you need and how often you  need them. This information is not intended to replace advice given to you by your health care provider. Make sure you discuss any questions you have with your health care provider. Document Released: 07/18/2015 Document Revised: 03/10/2016 Document Reviewed: 04/22/2015 Elsevier Interactive Patient Education  2018 Elsevier Inc.  

## 2018-03-22 NOTE — Progress Notes (Signed)
Subjective:    Patient ID: Colleen Reynolds, female    DOB: 08-12-1945, 72 y.o.   MRN: [id number]  No chief complaint on file.   HPI Patient was seen today for CPE.  States doing well.  Stable on prozac 40 mg.  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  Is followed by Ophtho-Dr. Brigitte Pulse, after h/o shingles affecting L eye.  Restarted Valtrex 1000 mg BID.  Pt has been on valtrex x 2 yrs.  Concern for med causing feeling of "off balance".    Past Medical History:  Diagnosis Date  . Depression   . Diverticulosis   . Kidney stones 2008  . OA (osteoarthritis) of hip   . Osteopenia   . Shingles   . Vitamin D deficiency     Allergies  Allergen Reactions  . Clindamycin/Lincomycin Nausea And Vomiting  . Lovastatin Other (See Comments)    Fatigue, muscle aches  . Penicillins     ROS General: Denies fever, chills, night sweats, changes in weight, changes in appetite HEENT: Denies headaches, ear pain, changes in vision, rhinorrhea, sore throat  +decreased vision in L eye s/p shingles, recent uveitis L eye CV: Denies CP, palpitations, SOB, orthopnea Pulm: Denies SOB, cough, wheezing GI: Denies abdominal pain, nausea, vomiting, diarrhea, constipation GU: Denies dysuria, hematuria, frequency, vaginal discharge Msk: Denies muscle cramps, joint pains Neuro: Denies weakness, numbness, tingling Skin: Denies rashes, bruising Psych: Denies anxiety, hallucinations  +h/o depression     Objective:    Blood pressure 110/78, pulse 96, temperature (!) 97.5 F (36.4 C), temperature source Oral, height 5\' 5"  (1.651 m), weight 181 lb (82.1 kg), SpO2 98 %.   Gen. Pleasant, well-nourished, in no distress, normal affect   HEENT: [place]/AT, face symmetric, conjunctiva clear, no scleral icterus, PERRLA, EOMI, nares patent without drainage, pharynx without erythema or exudate. TMs normal b/l.  No cervical lymphadenopathy. Neck: No JVD, no thyromegaly, no carotid bruits Lungs: no  accessory muscle use, CTAB, no wheezes or rales Cardiovascular: RRR, no m/r/g, no peripheral edema Abdomen: BS present, soft, NT/ND, no hepatosplenomegaly. Musculoskeletal: No deformities, no cyanosis or clubbing, normal tone Neuro:  A&Ox3, CN II-XII intact, normal gait Skin:  Warm, no lesions/ rash   Wt Readings from Last 3 Encounters:  03/22/18 181 lb (82.1 kg)  10/06/17 182 lb 3.2 oz (82.6 kg)  03/25/17 176 lb 6.4 oz (80 kg)    No results found for: WBC, HGB, HCT, PLT, GLUCOSE, CHOL, TRIG, HDL, LDLDIRECT, LDLCALC, ALT, AST, NA, K, CL, CREATININE, BUN, CO2, TSH, PSA, INR, GLUF, HGBA1C, MICROALBUR  Assessment/Plan:  Well adult exam -Anticipatory guidance given including wearing seatbelts, smoke detectors in the home, increasing physical activity, increasing p.o. intake of water and vegetables. -mammogram up to date -will order labs  -given handout  - Plan: CBC (no diff), Basic metabolic panel, Basic metabolic panel, CBC (no diff) -next CPE in 1 yr  Depression, recurrent ([place])  -stable - Plan: FLUoxetine (PROZAC) 40 MG capsule  Screening for cholesterol level  - Plan: Lipid panel  Need for 23-polyvalent pneumococcal polysaccharide vaccine  - Plan: Pneumococcal polysaccharide vaccine 23-valent greater than or equal to 2yo subcutaneous/IM  Need for immunization against influenza  - Plan: Flu vaccine HIGH DOSE PF  Discussed keeping f/u with Ophtho in Oct.  Consider decreasing Valtrex dose to see if balance issue improves.  Pt to discuss with Ophtho.  F/u prn  Grier Mitts, MD

## 2018-03-23 DIAGNOSIS — Z0000 Encounter for general adult medical examination without abnormal findings: Secondary | ICD-10-CM | POA: Diagnosis not present

## 2018-03-23 DIAGNOSIS — Z13220 Encounter for screening for lipoid disorders: Secondary | ICD-10-CM | POA: Diagnosis not present

## 2018-03-23 LAB — CBC
HCT: 43.3 % (ref 36.0–46.0)
Hemoglobin: 14.8 g/dL (ref 12.0–15.0)
MCHC: 34.1 g/dL (ref 30.0–36.0)
MCV: 94.4 fl (ref 78.0–100.0)
Platelets: 242 10*3/uL (ref 150.0–400.0)
RBC: 4.59 Mil/uL (ref 3.87–5.11)
RDW: 13.9 % (ref 11.5–15.5)
WBC: 7.2 10*3/uL (ref 4.0–10.5)

## 2018-03-23 LAB — BASIC METABOLIC PANEL
BUN: 19 mg/dL (ref 6–23)
CO2: 25 mEq/L (ref 19–32)
CREATININE: 0.92 mg/dL (ref 0.40–1.20)
Calcium: 9.7 mg/dL (ref 8.4–10.5)
Chloride: 105 mEq/L (ref 96–112)
GFR: 64.07 mL/min (ref >60.00)
Glucose, Bld: 91 mg/dL (ref 70–99)
POTASSIUM: 4.1 meq/L (ref 3.5–5.1)
Sodium: 138 mEq/L (ref 135–145)

## 2018-03-23 LAB — LIPID PANEL
CHOL/HDL RATIO: 4
Cholesterol: 242 mg/dL — ABNORMAL HIGH (ref 0–200)
HDL: 63.8 mg/dL (ref >39.00)
LDL Cholesterol: 159 mg/dL — ABNORMAL HIGH (ref 0–99)
NonHDL: 178.53
Triglycerides: 96 mg/dL (ref 0.0–149.0)
VLDL: 19.2 mg/dL (ref 0.0–40.0)

## 2018-03-25 ENCOUNTER — Encounter: Payer: Self-pay | Admitting: Family Medicine

## 2018-04-20 DIAGNOSIS — B0230 Zoster ocular disease, unspecified: Secondary | ICD-10-CM | POA: Diagnosis not present

## 2018-05-02 ENCOUNTER — Ambulatory Visit (INDEPENDENT_AMBULATORY_CARE_PROVIDER_SITE_OTHER): Payer: Medicare HMO | Admitting: Family Medicine

## 2018-05-02 ENCOUNTER — Encounter: Payer: Self-pay | Admitting: Family Medicine

## 2018-05-02 VITALS — BP 138/80 | HR 90 | Temp 98.7°F | Resp 12 | Ht 69.8 in | Wt 185.0 lb

## 2018-05-02 DIAGNOSIS — R030 Elevated blood-pressure reading, without diagnosis of hypertension: Secondary | ICD-10-CM | POA: Diagnosis not present

## 2018-05-02 DIAGNOSIS — R1030 Lower abdominal pain, unspecified: Secondary | ICD-10-CM | POA: Diagnosis not present

## 2018-05-02 DIAGNOSIS — R350 Frequency of micturition: Secondary | ICD-10-CM | POA: Diagnosis not present

## 2018-05-02 LAB — POCT URINALYSIS DIPSTICK
BILIRUBIN UA: NEGATIVE
Blood, UA: NEGATIVE
GLUCOSE UA: NEGATIVE
KETONES UA: NEGATIVE
Leukocytes, UA: NEGATIVE
Nitrite, UA: NEGATIVE
PROTEIN UA: NEGATIVE
Spec Grav, UA: 1.02 (ref 1.010–1.025)
Urobilinogen, UA: 0.2 E.U./dL
pH, UA: 6 (ref 5.0–8.0)

## 2018-05-02 NOTE — Progress Notes (Signed)
HPI:  Chief Complaint  Patient presents with  . Urinary Tract Infection    frequency, cramping, states odor has strong smell,     [name] is a 72 y.o. female, who is here today complaining of 1-2 days of urinary symptoms.  Dysuria: "little burning" "not very much." Urinary frequency: "Slightly"more than usual.  She thinks it is because she started drinking more fluids.She was not doing so last week. Urinary urgency: Denies Incontinence: Negative Gross hematuria: Denies.  Abdominal pain: "Occasional lower abdominal cramping." Pain is mild, she is not sure about exacerbating or alleviating factors.  She has no pain at this time.  She denies changes in bowel habits, nausea, vomiting.   Abnormal vaginal bleeding or discharge: Denies  LMP: Postmenopausal. Sexual activity: Yes Hx of UTI: Used to have frequent UTIs but has not had one in "long time."  She denies fever, fatigue, or body aches. She has some mild chills this morning.   OTC medications for this problem: None.  BP elevated today. She is reporting BP home readings 120s/?. No history of hypertension. She has not noted headache, chest pain, palpitation, or edema.   Review of Systems  Constitutional: Positive for chills. Negative for activity change, appetite change, fatigue and fever.  Respiratory: Negative for shortness of breath and wheezing.   Cardiovascular: Negative for chest pain, palpitations and leg swelling.  Gastrointestinal: Negative for abdominal pain, nausea and vomiting.       No changes in bowel habits.  Genitourinary: Positive for dysuria, frequency and urgency. Negative for decreased urine volume, hematuria, vaginal bleeding and vaginal discharge.  Musculoskeletal: Negative for back pain and myalgias.  Neurological: Negative for weakness and headaches.      Current Outpatient Medications on File Prior to Visit  Medication Sig Dispense Refill  . alendronate (FOSAMAX) 35  MG tablet 1 TAB EVERY 7 DAYS. IN A.M. W/FULL GLASS OF WATER,EMPTY STOMACH, DO NOT EAT/DRINK/LAY DOWN FOR 30MIN 12 tablet 2  . CALCIUM PO Take 400 mg by mouth 2 (two) times daily.    . Cholecalciferol (VITAMIN D PO) Take by mouth.    . FAMOTIDINE PO Take by mouth.    . fexofenadine (ALLEGRA) 180 MG tablet Take by mouth.    Marland Kitchen FLUoxetine (PROZAC) 40 MG capsule Take 1 capsule (40 mg total) by mouth daily. 90 capsule 3  . prednisoLONE acetate (PREDNISOLONE ACETATE P-F) 1 % ophthalmic suspension Place 1 drop into the left eye daily.    . Red Yeast Rice 600 MG CAPS Take 1,200 mg by mouth.    . valACYclovir (VALTREX) 1000 MG tablet Take 1,000 mg by mouth 2 (two) times daily.     Current Facility-Administered Medications on File Prior to Visit  Medication Dose Route Frequency Provider Last Rate Last Dose  . 0.9 %  sodium chloride infusion  500 mL Intravenous Continuous Nandigam, Venia Minks, MD         Past Medical History:  Diagnosis Date  . Depression   . Diverticulosis   . Kidney stones 2008  . OA (osteoarthritis) of hip   . Osteopenia   . Shingles   . Vitamin D deficiency    Allergies  Allergen Reactions  . Clindamycin/Lincomycin Nausea And Vomiting  . Lovastatin Other (See Comments)    Fatigue, muscle aches  . Penicillins     Social History   Socioeconomic History  . Marital status: Married    Spouse name: Not on file  . Number of  children: Not on file  . Years of education: Not on file  . Highest education level: Not on file  Occupational History  . Not on file  Social Needs  . Financial resource strain: Not on file  . Food insecurity:    Worry: Not on file    Inability: Not on file  . Transportation needs:    Medical: Not on file    Non-medical: Not on file  Tobacco Use  . Smoking status: Never Smoker  . Smokeless tobacco: Never Used  Substance and Sexual Activity  . Alcohol use: Yes    Comment: socially  . Drug use: No  . Sexual activity: Not on file    Lifestyle  . Physical activity:    Days per week: Not on file    Minutes per session: Not on file  . Stress: Not on file  Relationships  . Social connections:    Talks on phone: Not on file    Gets together: Not on file    Attends religious service: Not on file    Active member of club or organization: Not on file    Attends meetings of clubs or organizations: Not on file    Relationship status: Not on file  Other Topics Concern  . Not on file  Social History Narrative  . Not on file    Vitals:   05/02/18 1211  BP: (!) 150/90  Pulse: 90  Resp: 12  Temp: 98.7 F (37.1 C)  SpO2: 99%   Body mass index is 30.79 kg/m.    Physical Exam  Nursing note and vitals reviewed. Constitutional: She is oriented to person, place, and time. She appears well-developed. No distress.  HENT:  Head: Normocephalic and atraumatic.  Eyes: Conjunctivae are normal.  Cardiovascular: Normal rate and regular rhythm.  No murmur heard. Respiratory: Effort normal and breath sounds normal. No respiratory distress.  GI: Soft. She exhibits no mass. There is no hepatomegaly. There is no tenderness. There is no CVA tenderness.  Musculoskeletal: She exhibits no edema or tenderness.  Lymphadenopathy:    She has no cervical adenopathy.  Neurological: She is alert and oriented to person, place, and time. She has normal strength. No cranial nerve deficit. Gait normal.  Skin: Skin is warm. No erythema.  Psychiatric: Her mood appears anxious.  Well-groomed, good eye contact.    ASSESSMENT AND PLAN:   Ms. Alianis was seen today for urinary tract infection.  Diagnoses and all orders for this visit:  Urinary frequency  We discussed possible etiologies. Urine dipstick done today here in the office otherwise negative. We discussed options: Empiric antibiotic treatment vs follow urine culture and treat accordingly, she preferred the latter one. Continue adequate hydration. Vitamin C or cranberry  pills may help to acidify urine.  Instructed about warning signs.  -     POCT urinalysis dipstick -     Urine culture  Elevated blood pressure reading No history of hypertension. Rechecked: 138/80. Recommend continuing monitoring BP at home.  Abdominal pain, lower Abdominal examination negative. We discussed possible etiologies, she states that may be she is "imagine it." Colonoscopy in 11/2016. Clearly instructed about warning signs.     Sterling Ucci G. Martinique, MD  [facility] [place] office.

## 2018-05-02 NOTE — Patient Instructions (Addendum)
  [name] I have seen you today for an acute visit.  A few things to remember from today's visit:   Urinary frequency - Plan: POCT urinalysis dipstick, Urine culture   If medications prescribed today, they will not be refill upon request, a follow up appointment with PCP will be necessary to discuss continuation of of treatment if appropriate.   Continue monitoring for new or worsening symptoms. Increase fluid intake. Cranberry pills or vitamin C tablets may help to acidify urine. We will follow urine culture.  In general please monitor for signs of worsening symptoms and seek immediate medical attention if any concerning.  I hope you get better soon!

## 2018-05-03 ENCOUNTER — Other Ambulatory Visit: Payer: Self-pay | Admitting: Family Medicine

## 2018-05-03 ENCOUNTER — Telehealth: Payer: Self-pay

## 2018-05-03 MED ORDER — SULFAMETHOXAZOLE-TRIMETHOPRIM 800-160 MG PO TABS: 1.0000 | ORAL_TABLET | Freq: Two times a day (BID) | ORAL | 0 refills | Status: AC

## 2018-05-03 NOTE — Telephone Encounter (Signed)
Patient is aware 

## 2018-05-03 NOTE — Telephone Encounter (Signed)
Prescription for Bactrim DS was sent to her pharmacy to take twice daily for 3 days. Treatment will be tailored according to urine culture results. She needs to follow with PCP if she is still having symptoms after completing treatment.  Thanks, BJ

## 2018-05-03 NOTE — Telephone Encounter (Signed)
Copied from Eugenio Saenz [id number]. Topic: Quick Communication - Rx Refill/Question >> May 03, 2018  9:39 AM Scherrie Gerlach wrote: Medication: UTI med Pt saw Dr Martinique yesterday for possible UTI.  Pt states yesterday she was asymptomatic, just a little burning and mild symptoms. But today she is symptomatic, burning, urgency, discomfort, and tenderness.  Pt states she DOES want the Rx called into her pharmacy after all. [facility] [id number] [place] [address], [address] [phone] (Phone) [phone] (Fax)

## 2018-05-04 LAB — URINE CULTURE
MICRO NUMBER:: 91300230
SPECIMEN QUALITY:: ADEQUATE

## 2018-05-08 ENCOUNTER — Encounter: Payer: Self-pay | Admitting: Family Medicine

## 2018-05-18 ENCOUNTER — Ambulatory Visit: Payer: Self-pay

## 2018-05-18 NOTE — Telephone Encounter (Signed)
Dr. Volanda Napoleon - FYI. Thanks!

## 2018-05-18 NOTE — Telephone Encounter (Signed)
  Returned call to patient who has been treated for a UTI  with onset of symptoms Oct. 29.  She states that the urine culture showed E coli. She has finished her antibiotic and continues to have urgency.  She also states that she has a odd feeling in the outer tissue." It almost feels swollen". She denies flank pain fever blood in her urine.  She states she does have a cramping pain that will shoot through her bladder at times.  She rates her pain as mild at 3. Appointment scheduled per protocol. Care advice read to patient. Pt verbalized understanding of all instructions. Reason for Disposition . Urinating more frequently than usual (i.e., frequency)  Answer Assessment - Initial Assessment Questions 1. SYMPTOM: "What's the main symptom you're concerned about?" (e.g., frequency, incontinence)     Urgency odd sensation pressure or swelling in external tissues cramping ocassional 2. ONSET: "When did the UTI  start?"     Oct 29th 3. PAIN: "Is there any pain?" If so, ask: "How bad is it?" (Scale: 1-10; mild, moderate, severe)     3 4. CAUSE: "What do you think is causing the symptoms?"     E coli in urine 5. OTHER SYMPTOMS: "Do you have any other symptoms?" (e.g., fever, flank pain, blood in urine, pain with urination)     Funny feeling and feeling urgency 6. PREGNANCY: "Is there any chance you are pregnant?" "When was your last menstrual period?"     N/A  Protocols used: URINARY [facility]

## 2018-05-19 ENCOUNTER — Ambulatory Visit (INDEPENDENT_AMBULATORY_CARE_PROVIDER_SITE_OTHER): Payer: Medicare HMO | Admitting: Family Medicine

## 2018-05-19 ENCOUNTER — Encounter: Payer: Self-pay | Admitting: Family Medicine

## 2018-05-19 VITALS — BP 102/76 | Temp 97.9°F | Wt 184.0 lb

## 2018-05-19 DIAGNOSIS — F419 Anxiety disorder, unspecified: Secondary | ICD-10-CM

## 2018-05-19 DIAGNOSIS — R3989 Other symptoms and signs involving the genitourinary system: Secondary | ICD-10-CM

## 2018-05-19 DIAGNOSIS — R69 Illness, unspecified: Secondary | ICD-10-CM | POA: Diagnosis not present

## 2018-05-19 LAB — POC URINALSYSI DIPSTICK (AUTOMATED)
Bilirubin, UA: NEGATIVE
Blood, UA: NEGATIVE
GLUCOSE UA: NEGATIVE
KETONES UA: NEGATIVE
Leukocytes, UA: NEGATIVE
Nitrite, UA: NEGATIVE
Protein, UA: NEGATIVE
SPEC GRAV UA: 1.02 (ref 1.010–1.025)
Urobilinogen, UA: 0.2 E.U./dL
pH, UA: 6 (ref 5.0–8.0)

## 2018-05-19 NOTE — Patient Instructions (Addendum)
Living With Anxiety After being diagnosed with an anxiety disorder, you may be relieved to know why you have felt or behaved a certain way. It is natural to also feel overwhelmed about the treatment ahead and what it will mean for your life. With care and support, you can manage this condition and recover from it. How to cope with anxiety Dealing with stress Stress is your body's reaction to life changes and events, both good and bad. Stress can last just a few hours or it can be ongoing. Stress can play a major role in anxiety, so it is important to learn both how to cope with stress and how to think about it differently. Talk with your health care provider or a counselor to learn more about stress reduction. He or she may suggest some stress reduction techniques, such as:  Music therapy. This can include creating or listening to music that you enjoy and that inspires you.  Mindfulness-based meditation. This involves being aware of your normal breaths, rather than trying to control your breathing. It can be done while sitting or walking.  Centering prayer. This is a kind of meditation that involves focusing on a word, phrase, or sacred image that is meaningful to you and that brings you peace.  Deep breathing. To do this, expand your stomach and inhale slowly through your nose. Hold your breath for 3-5 seconds. Then exhale slowly, allowing your stomach muscles to relax.  Self-talk. This is a skill where you identify thought patterns that lead to anxiety reactions and correct those thoughts.  Muscle relaxation. This involves tensing muscles then relaxing them.  Choose a stress reduction technique that fits your lifestyle and personality. Stress reduction techniques take time and practice. Set aside 5-15 minutes a day to do them. Therapists can offer training in these techniques. The training may be covered by some insurance plans. Other things you can do to manage stress include:  Keeping a  stress diary. This can help you learn what triggers your stress and ways to control your response.  Thinking about how you respond to certain situations. You may not be able to control everything, but you can control your reaction.  Making time for activities that help you relax, and not feeling guilty about spending your time in this way.  Therapy combined with coping and stress-reduction skills provides the best chance for successful treatment. Medicines Medicines can help ease symptoms. Medicines for anxiety include:  Anti-anxiety drugs.  Antidepressants.  Beta-blockers.  Medicines may be used as the main treatment for anxiety disorder, along with therapy, or if other treatments are not working. Medicines should be prescribed by a health care provider. Relationships Relationships can play a big part in helping you recover. Try to spend more time connecting with trusted friends and family members. Consider going to couples counseling, taking family education classes, or going to family therapy. Therapy can help you and others better understand the condition. How to recognize changes in your condition Everyone has a different response to treatment for anxiety. Recovery from anxiety happens when symptoms decrease and stop interfering with your daily activities at home or work. This may mean that you will start to:  Have better concentration and focus.  Sleep better.  Be less irritable.  Have more energy.  Have improved memory.  It is important to recognize when your condition is getting worse. Contact your health care provider if your symptoms interfere with home or work and you do not feel like your condition  is improving. Where to find help and support: You can get help and support from these sources:  Self-help groups.  Online and [employer].  A trusted spiritual leader.  Couples counseling.  Family education classes.  Family therapy.  Follow these  instructions at home:  Eat a healthy diet that includes plenty of vegetables, fruits, whole grains, low-fat dairy products, and lean protein. Do not eat a lot of foods that are high in solid fats, added sugars, or salt.  Exercise. Most adults should do the following: ? Exercise for at least 150 minutes each week. The exercise should increase your heart rate and make you sweat (moderate-intensity exercise). ? Strengthening exercises at least twice a week.  Cut down on caffeine, tobacco, alcohol, and other potentially harmful substances.  Get the right amount and quality of sleep. Most adults need 7-9 hours of sleep each night.  Make choices that simplify your life.  Take over-the-counter and prescription medicines only as told by your health care provider.  Avoid caffeine, alcohol, and certain over-the-counter cold medicines. These may make you feel worse. Ask your pharmacist which medicines to avoid.  Keep all follow-up visits as told by your health care provider. This is important. Questions to ask your health care provider  Would I benefit from therapy?  How often should I follow up with a health care provider?  How long do I need to take medicine?  Are there any long-term side effects of my medicine?  Are there any alternatives to taking medicine? Contact a health care provider if:  You have a hard time staying focused or finishing daily tasks.  You spend many hours a day feeling worried about everyday life.  You become exhausted by worry.  You start to have headaches, feel tense, or have nausea.  You urinate more than normal.  You have diarrhea. Get help right away if:  You have a racing heart and shortness of breath.  You have thoughts of hurting yourself or others. If you ever feel like you may hurt yourself or others, or have thoughts about taking your own life, get help right away. You can go to your nearest emergency department or call:  Your local emergency  services (911 in the [place]  A suicide crisis helpline, such as the [facility] at (601)357-2262. This is open 24-hours a day.  Summary  Taking steps to deal with stress can help calm you.  Medicines cannot cure anxiety disorders, but they can help ease symptoms.  Family, friends, and partners can play a big part in helping you recover from an anxiety disorder. This information is not intended to replace advice given to you by your health care provider. Make sure you discuss any questions you have with your health care provider. Document Released: 06/15/2016 Document Revised: 06/15/2016 Document Reviewed: 06/15/2016 Elsevier Interactive Patient Education  2018 [place] and Stress Management Stress is a normal reaction to life events. It is what you feel when life demands more than you are used to or more than you can handle. Some stress can be useful. For example, the stress reaction can help you catch the last bus of the day, study for a test, or meet a deadline at work. But stress that occurs too often or for too long can cause problems. It can affect your emotional health and interfere with relationships and normal daily activities. Too much stress can weaken your immune system and increase your risk for physical illness. If  you already have a medical problem, stress can make it worse. What are the causes? All sorts of life events may cause stress. An event that causes stress for one person may not be stressful for another person. Major life events commonly cause stress. These may be positive or negative. Examples include losing your job, moving into a new home, getting married, having a baby, or losing a loved one. Less obvious life events may also cause stress, especially if they occur day after day or in combination. Examples include working long hours, driving in traffic, caring for children, being in debt, or being in a difficult relationship. What  are the signs or symptoms? Stress may cause emotional symptoms including, the following:  Anxiety. This is feeling worried, afraid, on edge, overwhelmed, or out of control.  Anger. This is feeling irritated or impatient.  Depression. This is feeling sad, down, helpless, or guilty.  Difficulty focusing, remembering, or making decisions.  Stress may cause physical symptoms, including the following:  Aches and pains. These may affect your head, neck, back, stomach, or other areas of your body.  Tight muscles or clenched jaw.  Low energy or trouble sleeping.  Stress may cause unhealthy behaviors, including the following:  Eating to feel better (overeating) or skipping meals.  Sleeping too little, too much, or both.  Working too much or putting off tasks (procrastination).  Smoking, drinking alcohol, or using drugs to feel better.  How is this diagnosed? Stress is diagnosed through an assessment by your health care provider. Your health care provider will ask questions about your symptoms and any stressful life events.Your health care provider will also ask about your medical history and may order blood tests or other tests. Certain medical conditions and medicine can cause physical symptoms similar to stress. Mental illness can cause emotional symptoms and unhealthy behaviors similar to stress. Your health care provider may refer you to a mental health professional for further evaluation. How is this treated? Stress management is the recommended treatment for stress.The goals of stress management are reducing stressful life events and coping with stress in healthy ways. Techniques for reducing stressful life events include the following:  Stress identification. Self-monitor for stress and identify what causes stress for you. These skills may help you to avoid some stressful events.  Time management. Set your priorities, keep a calendar of events, and learn to say "no." These tools  can help you avoid making too many commitments.  Techniques for coping with stress include the following:  Rethinking the problem. Try to think realistically about stressful events rather than ignoring them or overreacting. Try to find the positives in a stressful situation rather than focusing on the negatives.  Exercise. Physical exercise can release both physical and emotional tension. The key is to find a form of exercise you enjoy and do it regularly.  Relaxation techniques. These relax the body and mind. Examples include yoga, meditation, tai chi, biofeedback, deep breathing, progressive muscle relaxation, listening to music, being out in nature, journaling, and other hobbies. Again, the key is to find one or more that you enjoy and can do regularly.  Healthy lifestyle. Eat a balanced diet, get plenty of sleep, and do not smoke. Avoid using alcohol or drugs to relax.  Strong support network. Spend time with family, friends, or other people you enjoy being around.Express your feelings and talk things over with someone you trust.  Counseling or talktherapy with a mental health professional may be helpful if you are having  difficulty managing stress on your own. Medicine is typically not recommended for the treatment of stress.Talk to your health care provider if you think you need medicine for symptoms of stress. Follow these instructions at home:  Keep all follow-up visits as directed by your health care provider.  Take all medicines as directed by your health care provider. Contact a health care provider if:  Your symptoms get worse or you start having new symptoms.  You feel overwhelmed by your problems and can no longer manage them on your own. Get help right away if:  You feel like hurting yourself or someone else. This information is not intended to replace advice given to you by your health care provider. Make sure you discuss any questions you have with your health care  provider. Document Released: 12/15/2000 Document Revised: 11/27/2015 Document Reviewed: 02/13/2013 Elsevier Interactive Patient Education  2017 [employer].

## 2018-05-19 NOTE — Addendum Note (Signed)
Addended by: Wyvonne Lenz on: 05/19/2018 09:54 AM   Modules accepted: Orders

## 2018-05-19 NOTE — Progress Notes (Signed)
Acute Office Visit  Subjective:    Patient ID: Colleen Reynolds, female    DOB: [birth year], 72 y.o.   MRN: [id number]  No chief complaint on file.   HPI Patient is in today for acute concern.  Patient endorses feeling "more aware of bladder".  Feeling is not described as a pressure pain.  Patient was treated for UTI 2 weeks ago, but states maybe her anxiety is causing her to believe that the infection has not resolved.  Pt notes feeling in a "fog", having a dull HA x a month.  Pt is becoming more withdrawn, not having any energy or wanting to get out of bed.  Notes increased anxiety as her daughter will be giving birth soon.  Pt has a 3yo grandson, but prior to his delivery another grandchild was a still born.  Pt states she has never gotten over that.  Pt has not talked to anyone about this.  Past Medical History:  Diagnosis Date  . Depression   . Diverticulosis   . Kidney stones 2008  . OA (osteoarthritis) of hip   . Osteopenia   . Shingles   . Vitamin D deficiency     Past Surgical History:  Procedure Laterality Date  . ABDOMINAL HYSTERECTOMY    . BREAST REDUCTION SURGERY  1985  . COLONOSCOPY  2005  . LITHOTRIPSY  2008    Family History  Problem Relation Age of Onset  . Colon cancer Neg Hx   . Esophageal cancer Neg Hx   . Rectal cancer Neg Hx   . Stomach cancer Neg Hx     Social History   Socioeconomic History  . Marital status: Married    Spouse name: Not on file  . Number of children: Not on file  . Years of education: Not on file  . Highest education level: Not on file  Occupational History  . Not on file  Social Needs  . Financial resource strain: Not on file  . Food insecurity:    Worry: Not on file    Inability: Not on file  . Transportation needs:    Medical: Not on file    Non-medical: Not on file  Tobacco Use  . Smoking status: Never Smoker  . Smokeless tobacco: Never Used  Substance and Sexual Activity  . Alcohol use: Yes    Comment:  socially  . Drug use: No  . Sexual activity: Not on file  Lifestyle  . Physical activity:    Days per week: Not on file    Minutes per session: Not on file  . Stress: Not on file  Relationships  . Social connections:    Talks on phone: Not on file    Gets together: Not on file    Attends religious service: Not on file    Active member of club or organization: Not on file    Attends meetings of clubs or organizations: Not on file    Relationship status: Not on file  . Intimate partner violence:    Fear of current or ex partner: Not on file    Emotionally abused: Not on file    Physically abused: Not on file    Forced sexual activity: Not on file  Other Topics Concern  . Not on file  Social History Narrative  . Not on file    Outpatient Medications Prior to Visit  Medication Sig Dispense Refill  . alendronate (FOSAMAX) 35 MG tablet 1 TAB EVERY 7 DAYS. IN A.M.  W/FULL GLASS OF WATER,EMPTY STOMACH, DO NOT EAT/DRINK/LAY DOWN FOR 30MIN 12 tablet 2  . CALCIUM PO Take 400 mg by mouth 2 (two) times daily.    . Cholecalciferol (VITAMIN D PO) Take by mouth.    . FAMOTIDINE PO Take by mouth.    . fexofenadine (ALLEGRA) 180 MG tablet Take by mouth.    Marland Kitchen FLUoxetine (PROZAC) 40 MG capsule Take 1 capsule (40 mg total) by mouth daily. 90 capsule 3  . prednisoLONE acetate (PREDNISOLONE ACETATE P-F) 1 % ophthalmic suspension Place 1 drop into the left eye daily.    . Red Yeast Rice 600 MG CAPS Take 1,200 mg by mouth.    . valACYclovir (VALTREX) 1000 MG tablet Take 1,000 mg by mouth 2 (two) times daily.     Facility-Administered Medications Prior to Visit  Medication Dose Route Frequency Provider Last Rate Last Dose  . 0.9 %  sodium chloride infusion  500 mL Intravenous Continuous Nandigam, Venia Minks, MD        Allergies  Allergen Reactions  . Clindamycin/Lincomycin Nausea And Vomiting  . Lovastatin Other (See Comments)    Fatigue, muscle aches  . Penicillins     ROS General: Denies  fever, chills, night sweats, changes in weight, changes in appetite HEENT: Denies headaches, ear pain, changes in vision, rhinorrhea, sore throat CV: Denies CP, palpitations, SOB, orthopnea Pulm: Denies SOB, cough, wheezing GI: Denies abdominal pain, nausea, vomiting, diarrhea, constipation GU: Denies dysuria, hematuria, frequency, vaginal discharge  +bladder pressure? Msk: Denies muscle cramps, joint pains Neuro: Denies weakness, numbness, tingling Skin: Denies rashes, bruising Psych: Denies depression, hallucinations  +anxiety     Objective:    Physical Exam Gen. Pleasant, well developed, well-nourished, in NAD Lungs: no use of accessory muscles Cardiovascular: RRR, no peripheral edema Neuro:  A&Ox3, CN II-XII intact, normal gait Skin:  Warm, dry, intact, no lesions Psych: anxious/worried  BP 102/76   Temp 97.9 F (36.6 C) (Oral)   Wt 184 lb (83.5 kg)   BMI 26.55 kg/m  Wt Readings from Last 3 Encounters:  05/19/18 184 lb (83.5 kg)  05/02/18 185 lb (83.9 kg)  03/22/18 181 lb (82.1 kg)    Health Maintenance Due  Topic Date Due  . COLONOSCOPY  11/17/2017    There are no preventive care reminders to display for this patient.   No results found for: TSH Lab Results  Component Value Date   WBC 7.2 03/23/2018   HGB 14.8 03/23/2018   HCT 43.3 03/23/2018   MCV 94.4 03/23/2018   PLT 242.0 03/23/2018   Lab Results  Component Value Date   NA 138 03/23/2018   K 4.1 03/23/2018   CO2 25 03/23/2018   GLUCOSE 91 03/23/2018   BUN 19 03/23/2018   CREATININE 0.92 03/23/2018   CALCIUM 9.7 03/23/2018   GFR 64.07 03/23/2018   Lab Results  Component Value Date   CHOL 242 (H) 03/23/2018   Lab Results  Component Value Date   HDL 63.80 03/23/2018   Lab Results  Component Value Date   LDLCALC 159 (H) 03/23/2018   Lab Results  Component Value Date   TRIG 96.0 03/23/2018   Lab Results  Component Value Date   CHOLHDL 4 03/23/2018   No results found for: HGBA1C      Assessment & Plan:  Anxiety -Gad 7 score 7 -PHQ 9 score 10 -Continue Prozac 40 mg daily -Discussed counseling.  Pt given handout on area providers and advised to schedule her own appointment.  Sensation of pressure in bladder area -UA normal gravity 1.020 pH 6.0 -Patient encouraged to increase p.o. intake of fluids -Patient reassured -Patient given RTC precautions  Follow-up PRN Billie Ruddy, MD

## 2018-05-25 ENCOUNTER — Encounter: Payer: Self-pay | Admitting: Family Medicine

## 2018-07-05 HISTORY — PX: FRACTURE SURGERY: SHX138

## 2018-09-06 ENCOUNTER — Encounter: Payer: Self-pay | Admitting: Family Medicine

## 2018-09-06 ENCOUNTER — Ambulatory Visit (INDEPENDENT_AMBULATORY_CARE_PROVIDER_SITE_OTHER): Payer: Medicare HMO | Admitting: Family Medicine

## 2018-09-06 VITALS — BP 108/78 | HR 80 | Temp 98.0°F | Wt 181.0 lb

## 2018-09-06 DIAGNOSIS — R3915 Urgency of urination: Secondary | ICD-10-CM

## 2018-09-06 DIAGNOSIS — N3000 Acute cystitis without hematuria: Secondary | ICD-10-CM | POA: Diagnosis not present

## 2018-09-06 LAB — POC URINALSYSI DIPSTICK (AUTOMATED)
GLUCOSE UA: NEGATIVE
Ketones, UA: NEGATIVE
Nitrite, UA: POSITIVE
Protein, UA: NEGATIVE
RBC UA: NEGATIVE
SPEC GRAV UA: 1.015 (ref 1.010–1.025)
Urobilinogen, UA: 2 E.U./dL — AB
pH, UA: 6.5 (ref 5.0–8.0)

## 2018-09-06 MED ORDER — SULFAMETHOXAZOLE-TRIMETHOPRIM 800-160 MG PO TABS: 1.0000 | ORAL_TABLET | Freq: Two times a day (BID) | ORAL | 0 refills | Status: AC

## 2018-09-06 NOTE — Patient Instructions (Signed)

## 2018-09-06 NOTE — Progress Notes (Signed)
Subjective:    Patient ID: Colleen Reynolds, female    DOB: 1946-04-03, 73 y.o.   MRN: [id number]  No chief complaint on file.   HPI Patient was seen today for acute concern.  Pt endorses concern for UTI.  Pt with increased bladder pressure and soreness x4 days.  Patient started taking Azo for her symptoms.  Patient notes being dehydrated recently as she was working [employer] and has forgot to drink water.  Patient denies back pain, fever, chills, nausea, vomiting.  Patient does endorse muscle soreness 2/2 coughing.  Patient recovering from recent viral illness.  Past Medical History:  Diagnosis Date  . Depression   . Diverticulosis   . Kidney stones 2008  . OA (osteoarthritis) of hip   . Osteopenia   . Shingles   . Vitamin D deficiency     Allergies  Allergen Reactions  . Clindamycin/Lincomycin Nausea And Vomiting  . Lovastatin Other (See Comments)    Fatigue, muscle aches  . Penicillins     ROS General: Denies fever, chills, night sweats, changes in weight, changes in appetite HEENT: Denies headaches, ear pain, changes in vision, rhinorrhea, sore throat CV: Denies CP, palpitations, SOB, orthopnea Pulm: Denies SOB, cough, wheezing GI: Denies abdominal pain, nausea, vomiting, diarrhea, constipation GU: Denies dysuria, hematuria, frequency, vaginal discharge  + urinary pressure Msk: Denies muscle cramps, joint pains Neuro: Denies weakness, numbness, tingling Skin: Denies rashes, bruising Psych: Denies depression, anxiety, hallucinations    Objective:    Blood pressure 108/78, pulse 80, temperature 98 F (36.7 C), temperature source Oral, weight 181 lb (82.1 kg), SpO2 96 %.  Gen. Pleasant, well-nourished, in no distress, normal affect  HEENT: [place]/AT, face symmetric, conjunctiva clear, no scleral icterus, PERRLA, EOMI, nares patent without drainage Lungs: no accessory muscle use, CTAB, no wheezes or rales Cardiovascular: RRR, no m/r/g, no peripheral edema Abdomen: BS  present, soft, NT/ND, no suprapubic TTP.  No CVA tenderness Neuro:  A&Ox3, CN II-XII intact, normal gait  Wt Readings from Last 3 Encounters:  05/19/18 184 lb (83.5 kg)  05/02/18 185 lb (83.9 kg)  03/22/18 181 lb (82.1 kg)    Lab Results  Component Value Date   WBC 7.2 03/23/2018   HGB 14.8 03/23/2018   HCT 43.3 03/23/2018   PLT 242.0 03/23/2018   GLUCOSE 91 03/23/2018   CHOL 242 (H) 03/23/2018   TRIG 96.0 03/23/2018   HDL 63.80 03/23/2018   LDLCALC 159 (H) 03/23/2018   NA 138 03/23/2018   K 4.1 03/23/2018   CL 105 03/23/2018   CREATININE 0.92 03/23/2018   BUN 19 03/23/2018   CO2 25 03/23/2018    Assessment/Plan:  Acute cystitis without hematuria  -UA with 2+ bilirubin, SG 1.015, 2+ urobilinogen, positive nitrites, 3+ leuks -We will send for UCx -Given handout - Plan: sulfamethoxazole-trimethoprim (BACTRIM DS,SEPTRA DS) 800-160 MG tablet, Urine Culture  Urinary urgency  - Plan: POCT Urinalysis Dipstick (Automated)    F/u prn  Grier Mitts, MD

## 2018-09-08 LAB — URINE CULTURE
MICRO NUMBER: 276181
SPECIMEN QUALITY:: ADEQUATE

## 2018-10-20 ENCOUNTER — Ambulatory Visit: Payer: Self-pay | Admitting: Family Medicine

## 2018-10-20 DIAGNOSIS — N644 Mastodynia: Secondary | ICD-10-CM | POA: Diagnosis not present

## 2018-10-20 DIAGNOSIS — S21052A Open bite of left breast, initial encounter: Secondary | ICD-10-CM | POA: Diagnosis not present

## 2018-10-20 DIAGNOSIS — W57XXXA Bitten or stung by nonvenomous insect and other nonvenomous arthropods, initial encounter: Secondary | ICD-10-CM | POA: Diagnosis not present

## 2018-10-20 DIAGNOSIS — S20162A Insect bite (nonvenomous) of breast, left breast, initial encounter: Secondary | ICD-10-CM | POA: Diagnosis not present

## 2018-10-20 DIAGNOSIS — W540XXA Bitten by dog, initial encounter: Secondary | ICD-10-CM | POA: Diagnosis not present

## 2018-10-20 DIAGNOSIS — Z23 Encounter for immunization: Secondary | ICD-10-CM | POA: Diagnosis not present

## 2018-10-20 NOTE — Telephone Encounter (Signed)
Noted. FYI 

## 2018-10-20 NOTE — Telephone Encounter (Signed)
Pt. Was bit by a neighbors dog 15 minutes ago on her left breast just below the nipple, partially on the areola. 2 puncture wounds. "Looks deep" according to husband. Bruising noted. Will clean with soap and water. Spoke with Dominica and pt. Will go to UC for evaluation. Dog is up to date on shots per pt.  Reason for Disposition . Looks infected (red area, red streak, OR pus)  Answer Assessment - Initial Assessment Questions 1. ANIMAL: "What type of animal caused the bite?" "Is the injury from a bite or a claw?" If the animal is a dog or a cat, ask: "Was it a pet or a stray?" "Was it acting ill or behaving strangely?"     Dog bite 2. LOCATION: "Where is the bite located?"      Left breast at areola 3. SIZE: "How big is the bite?" "What does it look like?"      1 inch 4. ONSET: "When did the bite happen?" (Minutes or hours ago)      5 minutes ago 5. CIRCUMSTANCES: "Tell me how this happened."      Leaning on a neighbors fence 6. TETANUS: "When was the last tetanus booster?"     Unsure 7. PREGNANCY: "Is there any chance you are pregnant?" "When was your last menstrual period?"     No  Protocols used: ANIMAL BITE-A-AH

## 2018-10-27 ENCOUNTER — Telehealth: Payer: Self-pay | Admitting: *Deleted

## 2018-10-27 NOTE — Telephone Encounter (Signed)
-----   Message from Billie Ruddy, MD sent at 10/26/2018  5:07 PM EDT ----- Will you call and check on Mrs. Burgio?  She had a dog bite last wk.  Thanks,   Lockie Pares

## 2018-10-27 NOTE — Telephone Encounter (Signed)
Clinic RN attempted to contact patient. No answer. LVM for patient to return call. CRM created.

## 2018-11-03 DIAGNOSIS — S21052D Open bite of left breast, subsequent encounter: Secondary | ICD-10-CM | POA: Diagnosis not present

## 2018-11-03 DIAGNOSIS — W540XXD Bitten by dog, subsequent encounter: Secondary | ICD-10-CM | POA: Diagnosis not present

## 2018-11-03 DIAGNOSIS — N644 Mastodynia: Secondary | ICD-10-CM | POA: Diagnosis not present

## 2018-11-04 ENCOUNTER — Encounter: Payer: Self-pay | Admitting: Family Medicine

## 2018-12-06 ENCOUNTER — Other Ambulatory Visit: Payer: Self-pay | Admitting: Family Medicine

## 2019-01-03 DIAGNOSIS — H5213 Myopia, bilateral: Secondary | ICD-10-CM | POA: Diagnosis not present

## 2019-01-03 DIAGNOSIS — H18712 Corneal ectasia, left eye: Secondary | ICD-10-CM | POA: Diagnosis not present

## 2019-01-04 DIAGNOSIS — Z0100 Encounter for examination of eyes and vision without abnormal findings: Secondary | ICD-10-CM | POA: Diagnosis not present

## 2019-02-19 DIAGNOSIS — R69 Illness, unspecified: Secondary | ICD-10-CM | POA: Diagnosis not present

## 2019-03-14 DIAGNOSIS — H43813 Vitreous degeneration, bilateral: Secondary | ICD-10-CM | POA: Diagnosis not present

## 2019-03-15 DIAGNOSIS — R69 Illness, unspecified: Secondary | ICD-10-CM | POA: Diagnosis not present

## 2019-03-20 DIAGNOSIS — R69 Illness, unspecified: Secondary | ICD-10-CM | POA: Diagnosis not present

## 2019-03-21 DIAGNOSIS — R69 Illness, unspecified: Secondary | ICD-10-CM | POA: Diagnosis not present

## 2019-03-23 ENCOUNTER — Encounter: Payer: Self-pay | Admitting: Family Medicine

## 2019-03-25 ENCOUNTER — Other Ambulatory Visit: Payer: Self-pay | Admitting: Family Medicine

## 2019-03-25 DIAGNOSIS — F339 Major depressive disorder, recurrent, unspecified: Secondary | ICD-10-CM

## 2019-03-26 NOTE — Telephone Encounter (Signed)
Pt LOV was 09/06/2018 and last refill was on 03/22/2018 for 90 with 3 refills, please advise

## 2019-03-28 ENCOUNTER — Encounter: Payer: Self-pay | Admitting: Family Medicine

## 2019-06-09 ENCOUNTER — Emergency Department (HOSPITAL_COMMUNITY): Payer: Medicare HMO

## 2019-06-09 ENCOUNTER — Other Ambulatory Visit: Payer: Self-pay

## 2019-06-09 ENCOUNTER — Emergency Department (HOSPITAL_COMMUNITY)
Admission: EM | Admit: 2019-06-09 | Discharge: 2019-06-09 | Disposition: A | Discharge status: Home / Self Care | Payer: Medicare HMO | Attending: Emergency Medicine | Admitting: Emergency Medicine

## 2019-06-09 DIAGNOSIS — Y9389 Activity, other specified: Secondary | ICD-10-CM | POA: Insufficient documentation

## 2019-06-09 DIAGNOSIS — Y92007 Garden or yard of unspecified non-institutional (private) residence as the place of occurrence of the external cause: Secondary | ICD-10-CM | POA: Diagnosis not present

## 2019-06-09 DIAGNOSIS — Y999 Unspecified external cause status: Secondary | ICD-10-CM | POA: Diagnosis not present

## 2019-06-09 DIAGNOSIS — W19XXXA Unspecified fall, initial encounter: Secondary | ICD-10-CM

## 2019-06-09 DIAGNOSIS — S53105A Unspecified dislocation of left ulnohumeral joint, initial encounter: Secondary | ICD-10-CM | POA: Diagnosis not present

## 2019-06-09 DIAGNOSIS — I959 Hypotension, unspecified: Secondary | ICD-10-CM | POA: Diagnosis not present

## 2019-06-09 DIAGNOSIS — W010XXA Fall on same level from slipping, tripping and stumbling without subsequent striking against object, initial encounter: Secondary | ICD-10-CM | POA: Insufficient documentation

## 2019-06-09 DIAGNOSIS — S53125A Posterior dislocation of left ulnohumeral joint, initial encounter: Secondary | ICD-10-CM | POA: Insufficient documentation

## 2019-06-09 DIAGNOSIS — S59902A Unspecified injury of left elbow, initial encounter: Secondary | ICD-10-CM | POA: Diagnosis present

## 2019-06-09 DIAGNOSIS — R001 Bradycardia, unspecified: Secondary | ICD-10-CM | POA: Diagnosis not present

## 2019-06-09 DIAGNOSIS — Z79899 Other long term (current) drug therapy: Secondary | ICD-10-CM | POA: Insufficient documentation

## 2019-06-09 DIAGNOSIS — R000 Tachycardia, unspecified: Secondary | ICD-10-CM | POA: Diagnosis not present

## 2019-06-09 MED ORDER — HYDROMORPHONE HCL 1 MG/ML IJ SOLN
1.0000 mg | Freq: Once | INTRAMUSCULAR | Status: AC
  Administered 2019-06-09: 1 mg via INTRAVENOUS
  Filled 2019-06-09: qty 1

## 2019-06-09 MED ORDER — PROPOFOL 10 MG/ML IV BOLUS: 0.5000 mg/kg | Freq: Once | INTRAVENOUS | Status: DC

## 2019-06-09 MED ORDER — FENTANYL CITRATE (PF) 100 MCG/2ML IJ SOLN
INTRAMUSCULAR | Status: AC | PRN
  Administered 2019-06-09: 100 ug via INTRAVENOUS

## 2019-06-09 MED ORDER — MORPHINE SULFATE (PF) 4 MG/ML IV SOLN
4.0000 mg | Freq: Once | INTRAVENOUS | Status: AC
  Administered 2019-06-09: 4 mg via INTRAVENOUS
  Filled 2019-06-09: qty 1

## 2019-06-09 MED ORDER — ONDANSETRON 4 MG PO TBDP
4.0000 mg | ORAL_TABLET | Freq: Once | ORAL | Status: AC
  Administered 2019-06-09: 4 mg via ORAL
  Filled 2019-06-09: qty 1

## 2019-06-09 MED ORDER — HYDROCODONE-ACETAMINOPHEN 5-325 MG PO TABS: 1.0000 | ORAL_TABLET | ORAL | 0 refills | Status: DC | PRN

## 2019-06-09 MED ORDER — FENTANYL CITRATE (PF) 100 MCG/2ML IJ SOLN
INTRAMUSCULAR | Status: AC
  Filled 2019-06-09: qty 2

## 2019-06-09 MED ORDER — PROPOFOL 10 MG/ML IV BOLUS
INTRAVENOUS | Status: AC | PRN
  Administered 2019-06-09: 40 ug via INTRAVENOUS

## 2019-06-09 MED ORDER — PROPOFOL 10 MG/ML IV BOLUS
1.0000 mg/kg | Freq: Once | INTRAVENOUS | Status: AC
  Administered 2019-06-09: 40 mg via INTRAVENOUS
  Filled 2019-06-09: qty 20

## 2019-06-09 NOTE — ED Provider Notes (Signed)
[facility] DEPT Provider Note   CSN: [id number] Arrival date & time: 06/09/19  1103     History   Chief Complaint Chief Complaint  Patient presents with  . Fall  . Elbow Pain    left    HPI Colleen Reynolds is a 73 y.o. female.     HPI   73yo female presents with concern for mechanical fall with left elbow pain.  Reports she was returning from feeding her neighbor's cats when she slipped on leaves in the yard falling on her outstretched left arm. Had immediate pain and deformity to left elbow. Received fentanyl en route with some improvement. Pain severe, worse with movements. No wrist pain. Denies head trauma, headache, neck pain, numbness, weakness.  No recent illness.  Past Medical History:  Diagnosis Date  . Depression   . Diverticulosis   . Kidney stones 2008  . OA (osteoarthritis) of hip   . Osteopenia   . Shingles   . Vitamin D deficiency     There are no active problems to display for this patient.   Past Surgical History:  Procedure Laterality Date  . ABDOMINAL HYSTERECTOMY    . BREAST REDUCTION SURGERY  1985  . COLONOSCOPY  2005  . LITHOTRIPSY  2008     OB History   No obstetric history on file.      Home Medications    Prior to Admission medications   Medication Sig Start Date End Date Taking? Authorizing Provider  alendronate (FOSAMAX) 35 MG tablet 1 TAB EVERY 7 DAYS. IN A.M. W/FULL GLASS OF WATER,EMPTY STOMACH, DO NOT EAT/DRINK/LAY DOWN FOR 30MIN Patient taking differently: Take 35 mg by mouth every 7 (seven) days.  12/06/18  Yes Billie Ruddy, MD  CALCIUM PO Take 400 mg by mouth 2 (two) times daily.   Yes [provider]  Cholecalciferol (VITAMIN D PO) Take 1 capsule by mouth daily.    Yes [provider]  famotidine (PEPCID) 20 MG tablet Take 20 mg by mouth daily.    Yes [provider]  fexofenadine (ALLEGRA) 180 MG tablet Take 180 mg by mouth as needed for allergies or  rhinitis.    Yes [provider]  FLUoxetine (PROZAC) 40 MG capsule TAKE 1 CAPSULE BY MOUTH EVERY DAY Patient taking differently: Take 40 mg by mouth daily.  03/28/19  Yes Billie Ruddy, MD  prednisoLONE acetate (PREDNISOLONE ACETATE P-F) 1 % ophthalmic suspension Place 1 drop into the left eye daily.   Yes [provider]  Red Yeast Rice 600 MG CAPS Take 1,200 mg by mouth daily.    Yes [provider]  valACYclovir (VALTREX) 1000 MG tablet Take 1,000 mg by mouth daily.    Yes [provider]  HYDROcodone-acetaminophen (NORCO/VICODIN) 5-325 MG tablet Take 1 tablet by mouth every 4 (four) hours as needed. 06/09/19   Gareth Morgan, MD    Family History Family History  Problem Relation Age of Onset  . Colon cancer Neg Hx   . Esophageal cancer Neg Hx   . Rectal cancer Neg Hx   . Stomach cancer Neg Hx     Social History Social History   Tobacco Use  . Smoking status: Never Smoker  . Smokeless tobacco: Never Used  Substance Use Topics  . Alcohol use: Yes    Comment: socially  . Drug use: No     Allergies   Clindamycin/lincomycin, Lovastatin, and Penicillins   Review of Systems Review of Systems  Constitutional: Negative for fever.  Eyes: Negative for visual disturbance.  Respiratory: Negative for cough and shortness of breath.   Cardiovascular: Negative for chest pain.  Gastrointestinal: Negative for abdominal pain, nausea and vomiting.  Genitourinary: Negative for difficulty urinating.  Musculoskeletal: Positive for arthralgias. Negative for back pain and neck pain.  Skin: Negative for rash.  Neurological: Negative for syncope and headaches.     Physical Exam Updated Vital Signs BP 112/69   Pulse 78   Temp 98.6 F (37 C)   Resp 12   Ht 5\' 6"  (1.676 m)   Wt 81.6 kg   SpO2 100%   BMI 29.05 kg/m   Physical Exam Vitals signs and nursing note reviewed.  Constitutional:      General: She is not in acute distress.     Appearance: She is well-developed. She is not diaphoretic.  HENT:     Head: Normocephalic and atraumatic.  Eyes:     Conjunctiva/sclera: Conjunctivae normal.  Neck:     Musculoskeletal: Normal range of motion.  Cardiovascular:     Rate and Rhythm: Normal rate and regular rhythm.     Heart sounds: Normal heart sounds. No murmur. No friction rub. No gallop.   Pulmonary:     Effort: Pulmonary effort is normal. No respiratory distress.     Breath sounds: Normal breath sounds. No wheezing or rales.  Abdominal:     General: There is no distension.     Palpations: Abdomen is soft.     Tenderness: There is no abdominal tenderness. There is no guarding.  Musculoskeletal:     Left shoulder: She exhibits normal range of motion and no tenderness.     Left elbow: She exhibits decreased range of motion, swelling and deformity. Tenderness found.     Left wrist: She exhibits normal range of motion, no tenderness and no bony tenderness.     Cervical back: She exhibits no bony tenderness.  Skin:    General: Skin is warm and dry.     Findings: No erythema or rash.  Neurological:     Mental Status: She is alert and oriented to person, place, and time.      ED Treatments / Results  Labs (all labs ordered are listed, but only abnormal results are displayed) Labs Reviewed - No data to display  EKG None  Radiology Dg Elbow 2 Views Left  Result Date: 06/09/2019 CLINICAL DATA:  Post elbow reduction EXAM: LEFT ELBOW - 2 VIEW COMPARISON:  Earlier same day FINDINGS: Interval reduction of previously noted radial-humeral and ulnar-humeral dislocations. No definite fracture or elbow joint effusion given overlying casting material. Regional soft tissues appear normal. IMPRESSION: Interval reduction of previously noted complete elbow dislocation without definite fracture or elbow joint effusion. If concern persists for an occult fracture, further evaluation could be performed with elbow CT as indicated.  Electronically Signed   By: Sandi Mariscal M.D.   On: 06/09/2019 14:11   Dg Elbow 2 Views Left  Result Date: 06/09/2019 CLINICAL DATA:  Fall onto an outstretched hand EXAM: LEFT ELBOW - 2 VIEW COMPARISON:  None. FINDINGS: There is an elbow joint dislocation with a elbow joint effusion. A 7 mm ossific density overlying the lateral epicondyle and just proximal to the radial head may represent an osseous fragment, however no definite donor site is identified. There is surrounding soft tissue swelling. IMPRESSION: 1. Elbow joint dislocation with elbow joint effusion. 2. A 7 mm ossific density overlying the lateral epicondyle and proximal to  the radial head may represent an osseous fragment, however no definite donor site is identified. Electronically Signed   By: Zerita Boers M.D.   On: 06/09/2019 12:28    Procedures .Sedation  Date/Time: 06/10/2019 10:24 PM Performed by: Gareth Morgan, MD Authorized by: Gareth Morgan, MD   Consent:    Consent obtained:  Verbal   Consent given by:  Patient   Risks discussed:  Allergic reaction, dysrhythmia, inadequate sedation, nausea, prolonged hypoxia resulting in organ damage, prolonged sedation necessitating reversal, respiratory compromise necessitating ventilatory assistance and intubation and vomiting   Alternatives discussed:  Analgesia without sedation, anxiolysis and regional anesthesia Universal protocol:    Procedure explained and questions answered to patient or proxy's satisfaction: yes     Relevant documents present and verified: yes     Test results available and properly labeled: yes     Imaging studies available: yes     Required blood products, implants, devices, and special equipment available: yes     Site/side marked: yes     Immediately prior to procedure a time out was called: yes     Patient identity confirmation method:  Verbally with patient Indications:    Procedure necessitating sedation performed by:  Physician performing  sedation Pre-sedation assessment:    Time since last food or drink:  4   ASA classification: class 1 - normal, healthy patient     Neck mobility: normal     Mouth opening:  3 or more finger widths   Thyromental distance:  4 finger widths   Mallampati score:  I - soft palate, uvula, fauces, pillars visible   Pre-sedation assessments completed and reviewed: airway patency, cardiovascular function, hydration status, mental status, nausea/vomiting, pain level, respiratory function and temperature   Immediate pre-procedure details:    Reassessment: Patient reassessed immediately prior to procedure     Reviewed: vital signs, relevant labs/tests and NPO status     Verified: bag valve mask available, emergency equipment available, intubation equipment available, IV patency confirmed, oxygen available and suction available   Procedure details (see MAR for exact dosages):    Preoxygenation:  Nasal cannula   Sedation:  Propofol   Intended level of sedation: deep and moderate (conscious sedation)   Intra-procedure monitoring:  Blood pressure monitoring, cardiac monitor, continuous pulse oximetry, frequent LOC assessments, frequent vital sign checks and continuous capnometry   Intra-procedure events: none     Total Provider sedation time (minutes):  15 Post-procedure details:    Attendance: Constant attendance by certified staff until patient recovered     Recovery: Patient returned to pre-procedure baseline     Post-sedation assessments completed and reviewed: airway patency, cardiovascular function, hydration status, mental status, nausea/vomiting, pain level, respiratory function and temperature     Patient is stable for discharge or admission: yes     Patient tolerance:  Tolerated well, no immediate complications Comments:     Given 40mg  propofol Reduction of dislocation  Date/Time: 06/10/2019 10:26 PM Performed by: Gareth Morgan, MD Authorized by: Gareth Morgan, MD  Consent: Verbal  consent obtained. Written consent obtained. Risks and benefits: risks, benefits and alternatives were discussed Consent given by: patient Patient understanding: patient states understanding of the procedure being performed Patient consent: the patient's understanding of the procedure matches consent given Procedure consent: procedure consent matches procedure scheduled Relevant documents: relevant documents present and verified Test results: test results available and properly labeled Site marked: the operative site was marked Imaging studies: imaging studies available Required items: required blood products,  implants, devices, and special equipment available Patient identity confirmed: verbally with patient and arm band Time out: Immediately prior to procedure a "time out" was called to verify the correct patient, procedure, equipment, support staff and site/side marked as required. Preparation: Patient was prepped and draped in the usual sterile fashion.  Sedation: Patient sedated: yes Sedatives: propofol  Patient tolerance: patient tolerated the procedure well with no immediate complications    (including critical care time)  Medications Ordered in ED Medications  morphine 4 MG/ML injection 4 mg (4 mg Intravenous Given 06/09/19 1156)  HYDROmorphone (DILAUDID) injection 1 mg (1 mg Intravenous Given 06/09/19 1215)  propofol (DIPRIVAN) 10 mg/mL bolus/IV push 81.6 mg (40 mg Intravenous Given 06/09/19 1343)  propofol (DIPRIVAN) 10 mg/mL bolus/IV push (40 mcg Intravenous Given 06/09/19 1343)  fentaNYL (SUBLIMAZE) injection ( Intravenous Canceled Entry 06/09/19 1410)  ondansetron (ZOFRAN-ODT) disintegrating tablet 4 mg (4 mg Oral Given 06/09/19 1455)     Initial Impression / Assessment and Plan / ED Course  I have reviewed the triage vital signs and the nursing notes.  Pertinent labs & imaging results that were available during my care of the patient were reviewed by me and considered in  my medical decision making (see chart for details).        73yo female presents with concern for mechanical fall with left elbow pain.  XR shows left elbow dislocation, possible chip fracture.  NV intact. No sign of other injuries by history or exam.   Elbow reduced under propofol sedation and placed in posterior splint. NV intact following procedure. Pt returned to baseline and stable for discharge with follow up with Dr. Lorin [facility] of [facility]oped[facility] in one week.  Given rx for pain medications after discussion of risk and review in [place] drug database.   Final Clinical Impressions(s) / ED Diagnoses   Final diagnoses:  Closed posterior dislocation of left elbow, initial encounter    ED Discharge Orders         Ordered    HYDROcodone-acetaminophen (NORCO/VICODIN) 5-325 MG tablet  Every 4 hours PRN     06/09/19 1434           Gareth Morgan, MD 06/10/19 2227

## 2019-06-09 NOTE — ED Notes (Signed)
Gave pt ice chips to ensure nausea/vomiting does not continue.

## 2019-06-09 NOTE — ED Notes (Signed)
Family at bedside. 

## 2019-06-09 NOTE — ED Notes (Signed)
Pt with one occurrence of emesis while trying to transfer to wheelchair.  MD Schlossman notified.

## 2019-06-09 NOTE — ED Notes (Signed)
Colleen Reynolds (daughter) [phone] outside waiting to come back or get update about mother.

## 2019-06-09 NOTE — ED Notes (Signed)
Portable X-Ray at bedside.

## 2019-06-09 NOTE — ED Notes (Signed)
Patient transported to X-ray 

## 2019-06-09 NOTE — ED Notes (Signed)
Patient had no vomiting after ice chips

## 2019-06-09 NOTE — ED Triage Notes (Signed)
PT BIBA from home.   Per EMS- Pt had mechanical fall outside on wet leaves.  Denies LOC, denies taking blood thinners. Pt denies head trauma. Pt c/o left elbow pain. Pt AOx4.    18g R AC- EMS gave 250 mcg fentanyl en route.

## 2019-06-15 ENCOUNTER — Other Ambulatory Visit: Payer: Self-pay

## 2019-06-15 ENCOUNTER — Encounter: Payer: Self-pay | Admitting: Orthopaedic Surgery

## 2019-06-15 ENCOUNTER — Ambulatory Visit (INDEPENDENT_AMBULATORY_CARE_PROVIDER_SITE_OTHER): Payer: Medicare HMO | Admitting: Orthopaedic Surgery

## 2019-06-15 ENCOUNTER — Ambulatory Visit: Payer: Self-pay

## 2019-06-15 VITALS — Ht 66.0 in | Wt 180.0 lb

## 2019-06-15 DIAGNOSIS — S53125D Posterior dislocation of left ulnohumeral joint, subsequent encounter: Secondary | ICD-10-CM | POA: Diagnosis not present

## 2019-06-15 DIAGNOSIS — M25522 Pain in left elbow: Secondary | ICD-10-CM

## 2019-06-15 NOTE — Progress Notes (Signed)
Office Visit Note   Patient: Colleen Reynolds           Date of Birth: 1946/05/22           MRN: [id number] Visit Date: 06/15/2019              Requested by: Billie Ruddy, MD [place],  [place] [address] PCP: Billie Ruddy, MD   Assessment & Plan: Visit Diagnoses:  1. Pain in left elbow   2. Posterior dislocation of elbow, closed, left, subsequent encounter     Plan: Starting Monday she can work on active assistive elbow flexion extension we went over exercises.  She has full supination pronation.  X-ray and prereduction images were reviewed with her today.  She demonstrated understanding and exercise to work on flexion-extension.  She can apply some ice after exercises.  I will check her just for Christmas to make sure she is making progress with her motion and does not require formal physical therapy.   Up Instructions: Return in about 10 days (around 06/25/2019).   Orders:  Orders Placed This Encounter  Procedures  . XR Elbow 2 Views Left   No orders of the defined types were placed in this encounter.     Procedures: No procedures performed   Clinical Data: No additional findings.   Subjective: Chief Complaint  Patient presents with  . Left Elbow - Dislocation, Follow-up    Fall 06/09/2019    HPI 73 year old female slipped on some leaves outside when she is going over to feed her neighbors cat and suffered a acute left elbow dislocation.  She was reduced in the emergency room by the ER MD.  She is in a posterior splint she is using Aleve for pain no past history of injury to the elbow.  Postreduction x-ray showed good reduction of the elbow.  X-ray obtained today shows the elbow remains reduced.  Review of Systems review of systems noncontributory as pertains HPI.   Objective: Vital Signs: Ht 5\' 6"  (1.676 m)   Wt 180 lb (81.6 kg)   BMI 29.05 kg/m   Physical Exam Constitutional:      Appearance: She is well-developed.  HENT:       Head: Normocephalic.     Right Ear: External ear normal.     Left Ear: External ear normal.  Eyes:     Pupils: Pupils are equal, round, and reactive to light.  Neck:     Thyroid: No thyromegaly.     Trachea: No tracheal deviation.  Cardiovascular:     Rate and Rhythm: Normal rate.  Pulmonary:     Effort: Pulmonary effort is normal.  Abdominal:     Palpations: Abdomen is soft.  Skin:    General: Skin is warm and dry.  Neurological:     Mental Status: She is alert and oriented to person, place, and time.  Psychiatric:        Behavior: Behavior normal.     Ortho Exam finger flexion extension is good there is some ecchymosis adjacent to the elbow.  Ulnar median sensation hand is intact.  Specialty Comments:  No specialty comments available.  Imaging: XR Elbow 2 Views Left  Result Date: 06/15/2019 Single lateral x-ray left elbow shows concentric glenohumeral reduction. Impression: Reduced glenohumeral joint.    PMFS History: Patient Active Problem List   Diagnosis Date Noted  . Posterior dislocation of elbow, closed, left, subsequent encounter 06/15/2019   Past Medical History:  Diagnosis  Date  . Depression   . Diverticulosis   . Kidney stones 2008  . OA (osteoarthritis) of hip   . Osteopenia   . Shingles   . Vitamin D deficiency     Family History  Problem Relation Age of Onset  . Colon cancer Neg Hx   . Esophageal cancer Neg Hx   . Rectal cancer Neg Hx   . Stomach cancer Neg Hx     Past Surgical History:  Procedure Laterality Date  . ABDOMINAL HYSTERECTOMY    . BREAST REDUCTION SURGERY  1985  . COLONOSCOPY  2005  . LITHOTRIPSY  2008   Social History   Occupational History  . Not on file  Tobacco Use  . Smoking status: Never Smoker  . Smokeless tobacco: Never Used  Substance and Sexual Activity  . Alcohol use: Yes    Comment: socially  . Drug use: No  . Sexual activity: Not on file

## 2019-06-26 ENCOUNTER — Ambulatory Visit: Payer: Medicare HMO | Admitting: Orthopaedic Surgery

## 2019-09-03 ENCOUNTER — Other Ambulatory Visit: Payer: Self-pay | Admitting: Family Medicine

## 2019-10-19 DIAGNOSIS — Z7983 Long term (current) use of bisphosphonates: Secondary | ICD-10-CM | POA: Diagnosis not present

## 2019-10-19 DIAGNOSIS — J309 Allergic rhinitis, unspecified: Secondary | ICD-10-CM | POA: Diagnosis not present

## 2019-10-19 DIAGNOSIS — Z880 Allergy status to penicillin: Secondary | ICD-10-CM | POA: Diagnosis not present

## 2019-10-19 DIAGNOSIS — R69 Illness, unspecified: Secondary | ICD-10-CM | POA: Diagnosis not present

## 2019-10-19 DIAGNOSIS — M8580 Other specified disorders of bone density and structure, unspecified site: Secondary | ICD-10-CM | POA: Diagnosis not present

## 2019-10-19 DIAGNOSIS — Z7952 Long term (current) use of systemic steroids: Secondary | ICD-10-CM | POA: Diagnosis not present

## 2019-10-19 DIAGNOSIS — B0232 Zoster iridocyclitis: Secondary | ICD-10-CM | POA: Diagnosis not present

## 2019-10-19 DIAGNOSIS — K219 Gastro-esophageal reflux disease without esophagitis: Secondary | ICD-10-CM | POA: Diagnosis not present

## 2019-10-19 DIAGNOSIS — E663 Overweight: Secondary | ICD-10-CM | POA: Diagnosis not present

## 2019-11-14 DIAGNOSIS — M1711 Unilateral primary osteoarthritis, right knee: Secondary | ICD-10-CM | POA: Diagnosis not present

## 2019-11-14 DIAGNOSIS — M1611 Unilateral primary osteoarthritis, right hip: Secondary | ICD-10-CM | POA: Diagnosis not present

## 2019-11-16 ENCOUNTER — Encounter: Payer: Self-pay | Admitting: Family Medicine

## 2019-11-19 DIAGNOSIS — R69 Illness, unspecified: Secondary | ICD-10-CM | POA: Diagnosis not present

## 2019-11-21 ENCOUNTER — Encounter: Payer: Self-pay | Admitting: Family Medicine

## 2019-11-29 ENCOUNTER — Encounter: Payer: Self-pay | Admitting: Family Medicine

## 2019-11-29 NOTE — Telephone Encounter (Signed)
Pt has been scheduled for visit.  

## 2019-11-30 ENCOUNTER — Other Ambulatory Visit: Payer: Self-pay

## 2019-11-30 ENCOUNTER — Ambulatory Visit (INDEPENDENT_AMBULATORY_CARE_PROVIDER_SITE_OTHER): Payer: Medicare HMO | Admitting: Family Medicine

## 2019-11-30 ENCOUNTER — Encounter: Payer: Self-pay | Admitting: Family Medicine

## 2019-11-30 VITALS — BP 110/80 | HR 88 | Temp 97.8°F | Wt 186.0 lb

## 2019-11-30 DIAGNOSIS — R2689 Other abnormalities of gait and mobility: Secondary | ICD-10-CM | POA: Diagnosis not present

## 2019-11-30 DIAGNOSIS — R3915 Urgency of urination: Secondary | ICD-10-CM

## 2019-11-30 DIAGNOSIS — M810 Age-related osteoporosis without current pathological fracture: Secondary | ICD-10-CM | POA: Insufficient documentation

## 2019-11-30 LAB — POC URINALSYSI DIPSTICK (AUTOMATED)
Bilirubin, UA: NEGATIVE
Blood, UA: NEGATIVE
Glucose, UA: NEGATIVE
Ketones, UA: NEGATIVE
Leukocytes, UA: NEGATIVE
Nitrite, UA: NEGATIVE
Protein, UA: NEGATIVE
Spec Grav, UA: 1.01 (ref 1.010–1.025)
Urobilinogen, UA: 0.2 E.U./dL
pH, UA: 6.5 (ref 5.0–8.0)

## 2019-11-30 NOTE — Progress Notes (Signed)
Subjective:    Patient ID: Colleen Reynolds, female    DOB: 1946-02-22, 74 y.o.   MRN: [id number]  No chief complaint on file.   HPI Patient was seen today for acute concern.  Pt endorses intermittent urinary urgency, pressure, and a "pins and needles sensation" x4 days. Has increased po intake of water and fluids.  May drink a 1/2 can of soda.  Pt had a dog bite on left breast and dislocated her left last office visit.  Pt notes a small keloid developed on part of the area after a suture came out.  Pt also had a ruptured Baker's cyst in left popliteal area.  Pt states Ortho recommended she take a break from fosamax.  Pt states she has been on the med for several yrs.  Otherwise doing well.  Pt mentionsissues with balance at the end of visit.  States worse with increased weight.  Started using a cane when walking long distances.  Past Medical History:  Diagnosis Date  . Depression   . Diverticulosis   . Kidney stones 2008  . OA (osteoarthritis) of hip   . Osteopenia   . Shingles   . Vitamin D deficiency     Allergies  Allergen Reactions  . Clindamycin/Lincomycin Nausea And Vomiting  . Lovastatin Other (See Comments)    Fatigue, muscle aches  . Penicillins     ROS General: Denies fever, chills, night sweats, changes in weight, changes in appetite + HEENT: Denies headaches, ear pain, changes in vision, rhinorrhea, sore throat +balance issues CV: Denies CP, palpitations, SOB, orthopnea Pulm: Denies SOB, cough, wheezing GI: Denies abdominal pain, nausea, vomiting, diarrhea, constipation GU: Denies dysuria, hematuria, frequency, vaginal discharge  + urinary frequency, suprapubic pressure, pins-and-needles sensation in bladder. Msk: Denies muscle cramps, joint pains Neuro: Denies weakness, numbness, tingling Skin: Denies rashes, bruising Psych: Denies depression, anxiety, hallucinations    Objective:    Blood pressure 110/80, pulse 88, temperature 97.8 F (36.6 C), temperature  source Temporal, weight 186 lb (84.4 kg), SpO2 97 %.  Gen. Pleasant, well-nourished, in no distress, normal affect   HEENT: [place]/AT, face symmetric, no scleral icterus, PERRLA, EOMI, nares patent without drainage Lungs: no accessory muscle use, CTAB, no wheezes or rales Cardiovascular: RRR, no m/r/g, no peripheral edema Abdomen: BS present, soft, NT/ND, no hepatosplenomegaly. Musculoskeletal: No deformities, no cyanosis or clubbing, normal tone Neuro:  A&Ox3, CN II-XII intact, normal gait Skin:  Warm, no lesions/ rash.  Weal healed incision right knee.   Wt Readings from Last 3 Encounters:  06/15/19 180 lb (81.6 kg)  06/09/19 180 lb (81.6 kg)  09/06/18 181 lb (82.1 kg)    Lab Results  Component Value Date   WBC 7.2 03/23/2018   HGB 14.8 03/23/2018   HCT 43.3 03/23/2018   PLT 242.0 03/23/2018   GLUCOSE 91 03/23/2018   CHOL 242 (H) 03/23/2018   TRIG 96.0 03/23/2018   HDL 63.80 03/23/2018   LDLCALC 159 (H) 03/23/2018   NA 138 03/23/2018   K 4.1 03/23/2018   CL 105 03/23/2018   CREATININE 0.92 03/23/2018   BUN 19 03/23/2018   CO2 25 03/23/2018    Assessment/Plan:  Urinary urgency  -UA negative -Continue increasing p.o. intake of fluids. -given precautions - Plan: POCT Urinalysis Dipstick (Automated)  Balance problem -offered PT. Pt declines at this time -will continue to monitor  Osteoporosis without current pathological fracture, unspecified osteoporosis type -will d/c fosamax -Patient encouraged to take over-the-counter vitamin D and calcium daily -Weightbearing  exercises encouraged -Given handouts -Bone density scan due later this year.  F/u prn  Grier Mitts, MD

## 2019-11-30 NOTE — Patient Instructions (Signed)
Urinary Frequency, Adult Urinary frequency means urinating more often than usual. You may urinate every 1-2 hours even though you drink a normal amount of fluid and do not have a bladder infection or condition. Although you urinate more often than normal, the total amount of urine produced in a day is normal. With urinary frequency, you may have an urgent need to urinate often. The stress and anxiety of needing to find a bathroom quickly can make this urge worse. This condition may go away on its own or you may need treatment at home. Home treatment may include bladder training, exercises, taking medicines, or making changes to your diet. Follow these instructions at home: Bladder health   Keep a bladder diary if told by your health care provider. Keep track of: ? What you eat and drink. ? How often you urinate. ? How much you urinate.  Follow a bladder training program if told by your health care provider. This may include: ? Learning to delay going to the bathroom. ? Double urinating (voiding). This helps if you are not completely emptying your bladder. ? Scheduled voiding.  Do Kegel exercises as told by your health care provider. Kegel exercises strengthen the muscles that help control urination, which may help the condition. Eating and drinking  If told by your health care provider, make diet changes, such as: ? Avoiding caffeine. ? Drinking fewer fluids, especially alcohol. ? Not drinking in the evening. ? Avoiding foods or drinks that may irritate the bladder. These include coffee, tea, soda, artificial sweeteners, citrus, tomato-based foods, and chocolate. ? Eating foods that help prevent or ease constipation. Constipation can make this condition worse. Your health care provider may recommend that you:  Drink enough fluid to keep your urine pale yellow.  Take over-the-counter or prescription medicines.  Eat foods that are high in fiber, such as beans, whole grains, and fresh  fruits and vegetables.  Limit foods that are high in fat and processed sugars, such as fried or sweet foods. General instructions  Take over-the-counter and prescription medicines only as told by your health care provider.  Keep all follow-up visits as told by your health care provider. This is important. Contact a health care provider if:  You start urinating more often.  You feel pain or irritation when you urinate.  You notice blood in your urine.  Your urine looks cloudy.  You develop a fever.  You begin vomiting. Get help right away if:  You are unable to urinate. Summary  Urinary frequency means urinating more often than usual. With urinary frequency, you may urinate every 1-2 hours even though you drink a normal amount of fluid and do not have a bladder infection or other bladder condition.  Your health care provider may recommend that you keep a bladder diary, follow a bladder training program, or make dietary changes.  If told by your health care provider, do Kegel exercises to strengthen the muscles that help control urination.  Take over-the-counter and prescription medicines only as told by your health care provider.  Contact a health care provider if your symptoms do not improve or get worse. This information is not intended to replace advice given to you by your health care provider. Make sure you discuss any questions you have with your health care provider. Document Revised: 12/29/2017 Document Reviewed: 12/29/2017 Elsevier Patient Education  [place].  Preventing Osteoporosis, Adult Osteoporosis is a condition that causes the bones to lose density. This means that the bones become  thinner, and the normal spaces in bone tissue become larger. Low bone density can make the bones weak and cause them to break more easily. Osteoporosis cannot always be prevented, but you can take steps to lower your risk of developing this condition. How can this  condition affect me? If you develop osteoporosis, you will be more likely to break bones in your wrist, spine, or hip. Even a minor accident or injury can be enough to break weak bones. The bones will also be slower to heal. Osteoporosis can cause other problems as well, such as a stooped posture or trouble with movement. Osteoporosis can occur with aging. As you get older, you may lose bone tissue more quickly, or it may be replaced more slowly. Osteoporosis is more likely to develop if you have poor nutrition or do not get enough calcium or vitamin D. Other lifestyle factors can also play a role. By eating a well-balanced diet and making lifestyle changes, you can help keep your bones strong and healthy, lowering your chances of developing osteoporosis. What can increase my risk? The following factors may make you more likely to develop osteoporosis:  Having a family history of the condition.  Having poor nutrition or not getting enough calcium or vitamin D.  Using certain medicines, such as steroid medicines or antiseizure medicines.  Being any of the following: ? 49 years of age or older. ? Female. ? A woman who has gone through menopause (is postmenopausal). ? White (Caucasian) or of Asian descent.  Smoking or having a history of smoking.  Not being physically active (being sedentary).  Having a small body frame. What actions can I take to prevent this?  Get enough calcium   Make sure you get enough calcium every day. Calcium is the most important mineral for bone health. Most people can get enough calcium from their diet, but supplements may be recommended for people who are at risk for osteoporosis. Follow these guidelines: ? If you are age 67 or younger, aim to get 1,000 mg of calcium every day. ? If you are older than age 79, aim to get 1,200 mg of calcium every day.  Good sources of calcium include: ? Dairy products, such as low-fat or nonfat milk, cheese, and  yogurt. ? Dark green leafy vegetables, such as bok choy and broccoli. ? Foods that have had calcium added to them (calcium-fortified foods), such as orange juice, cereal, bread, soy beverages, and tofu products. ? Nuts, such as almonds.  Check nutrition labels to see how much calcium is in a food or drink. Get enough vitamin D  Try to get enough vitamin D every day. Vitamin D is the most essential vitamin for bone health. It helps the body absorb calcium. Follow these guidelines for how much vitamin D to get from food: ? If you are age 68 or younger, aim to get at least 600 international units (IU) every day. Your health care provider may suggest more. ? If you are older than age 20, aim to get at least 800 international units every day. Your health care provider may suggest more.  Good sources of vitamin D in your diet include: ? Egg yolks. ? Oily fish, such as salmon, sardines, and tuna. ? Milk and cereal fortified with vitamin D.  Your body also makes vitamin D when you are out in the sun. Exposing the bare skin on your face, arms, legs, or back to the sun for no more than 30 minutes a day,  2 times a week is more than enough. Beyond that, make sure you use sunblock to protect your skin from sunburn, which increases your risk for skin cancer. Exercise  Stay active and get exercise every day.  Ask your health care provider what types of exercise are best for you. Weight-bearing and strength-building activities are important for building and maintaining healthy bones. Some examples of these types of activities include: ? Walking and hiking. ? Jogging and running. ? Dancing. ? Gym exercises. ? Lifting weights. ? Tennis and racquetball. ? Climbing stairs. ? Aerobics. Make other lifestyle changes  Do not use any products that contain nicotine or tobacco, such as cigarettes, e-cigarettes, and chewing tobacco. If you need help quitting, ask your health care provider.  Lose weight if you  are overweight.  If you drink alcohol: ? Limit how much you use to:  0-1 drink a day for nonpregnant women.  0-2 drinks a day for men. ? Be aware of how much alcohol is in your drink. In the [place] one drink equals one 12 oz bottle of beer (355 mL), one 5 oz glass of wine (148 mL), or one 1 oz glass of hard liquor (44 mL). Where to find support If you need help making changes to prevent osteoporosis, talk with your health care provider. You can ask for a referral to a diet and nutrition specialist (dietitian) and a physical therapist. Where to find more information Learn more about osteoporosis from:  NIH Osteoporosis and Related [place]: www.bones.[employer]Ex[employer].es  [place] Office on [employer]: [place].[place]  [place]: [employer].ee Summary  Osteoporosis is a condition that causes weak bones that are more likely to break.  Eat a healthy diet, making sure you get enough calcium and vitamin D, and stay active by getting regular exercise to help prevent osteoporosis.  Other ways to reduce your risk of osteoporosis include maintaining a healthy weight and avoiding alcohol and products that contain nicotine or tobacco. This information is not intended to replace advice given to you by your health care provider. Make sure you discuss any questions you have with your health care provider. Document Revised: 01/19/2019 Document Reviewed: 01/19/2019 Elsevier Patient Education  [place].  Osteoporosis  Osteoporosis is thinning and loss of density in your bones. Osteoporosis makes bones more brittle and fragile and more likely to break (fracture). Over time, osteoporosis can cause your bones to become so weak that they fracture after a minor fall. Bones in the hip, wrist, and spine are most likely to fracture due to osteoporosis. What are the causes? The exact cause of this condition is not known. What increases the risk? You may  be at greater risk for osteoporosis if you:  Have a family history of the condition.  Have poor nutrition.  Use steroid medicines, such as prednisone.  Are female.  Are age 43 or older.  Smoke or have a history of smoking.  Are not physically active (are sedentary).  Are white (Caucasian) or of Asian descent.  Have a small body frame.  Take certain medicines, such as antiseizure medicines. What are the signs or symptoms? A fracture might be the first sign of osteoporosis, especially if the fracture results from a fall or injury that usually would not cause a bone to break. Other signs and symptoms include:  Pain in the neck or low back.  Stooped posture.  Loss of height. How is this diagnosed? This condition may be diagnosed based on:  Your medical history.  A physical exam.  A bone mineral density test, also called a DXA or DEXA test (dual-energy X-ray absorptiometry test). This test uses X-rays to measure the amount of minerals in your bones. How is this treated? The goal of treatment is to strengthen your bones and lower your risk for a fracture. Treatment may involve:  Making lifestyle changes, such as: ? Including foods with more calcium and vitamin D in your diet. ? Doing weight-bearing and muscle-strengthening exercises. ? Stopping tobacco use. ? Limiting alcohol intake.  Taking medicine to slow the process of bone loss or to increase bone density.  Taking daily supplements of calcium and vitamin D.  Taking hormone replacement medicines, such as estrogen for women and testosterone for men.  Monitoring your levels of calcium and vitamin D. Follow these instructions at home:  Activity  Exercise as told by your health care provider. Ask your health care provider what exercises and activities are safe for you. You should do: ? Exercises that make you work against gravity (weight-bearing exercises), such as tai chi, yoga, or walking. ? Exercises to  strengthen muscles, such as lifting weights. Lifestyle  Limit alcohol intake to no more than 1 drink a day for nonpregnant women and 2 drinks a day for men. One drink equals 12 oz of beer, 5 oz of wine, or 1 oz of hard liquor.  Do not use any products that contain nicotine or tobacco, such as cigarettes and e-cigarettes. If you need help quitting, ask your health care provider. Preventing falls  Use devices to help you move around (mobility aids) as needed, such as canes, walkers, scooters, or crutches.  Keep rooms well-lit and clutter-free.  Remove tripping hazards from walkways, including cords and throw rugs.  Install grab bars in bathrooms and safety rails on stairs.  Use rubber mats in the bathroom and other areas that are often wet or slippery.  Wear closed-toe shoes that fit well and support your feet. Wear shoes that have rubber soles or low heels.  Review your medicines with your health care provider. Some medicines can cause dizziness or changes in blood pressure, which can increase your risk of falling. General instructions  Include calcium and vitamin D in your diet. Calcium is important for bone health, and vitamin D helps your body to absorb calcium. Good sources of calcium and vitamin D include: ? Certain fatty fish, such as salmon and tuna. ? Products that have calcium and vitamin D added to them (fortified products), such as fortified cereals. ? Egg yolks. ? Cheese. ? Liver.  Take over-the-counter and prescription medicines only as told by your health care provider.  Keep all follow-up visits as told by your health care provider. This is important. Contact a health care provider if:  You have never been screened for osteoporosis and you are: ? A woman who is age 64 or older. ? A man who is age 3 or older. Get help right away if:  You fall or injure yourself. Summary  Osteoporosis is thinning and loss of density in your bones. This makes bones more brittle  and fragile and more likely to break (fracture),even with minor falls.  The goal of treatment is to strengthen your bones and reduce your risk for a fracture.  Include calcium and vitamin D in your diet. Calcium is important for bone health, and vitamin D helps your body to absorb calcium.  Talk with your health care provider about screening for osteoporosis if you  are a woman who is age 48 or older, or a man who is age 6 or older. This information is not intended to replace advice given to you by your health care provider. Make sure you discuss any questions you have with your health care provider. Document Revised: 06/03/2017 Document Reviewed: 04/15/2017 Elsevier Patient Education  2020 [employer].

## 2019-12-12 DIAGNOSIS — R69 Illness, unspecified: Secondary | ICD-10-CM | POA: Diagnosis not present

## 2019-12-24 ENCOUNTER — Encounter: Payer: Self-pay | Admitting: Family Medicine

## 2020-01-08 ENCOUNTER — Encounter: Payer: Self-pay | Admitting: Family Medicine

## 2020-01-11 NOTE — Telephone Encounter (Signed)
Patient called because she still hasn't received a response.  Please advise

## 2020-01-18 ENCOUNTER — Other Ambulatory Visit: Payer: Self-pay | Admitting: Family Medicine

## 2020-01-18 DIAGNOSIS — B009 Herpesviral infection, unspecified: Secondary | ICD-10-CM

## 2020-01-18 MED ORDER — VALACYCLOVIR HCL 1 G PO TABS: 1000.0000 mg | ORAL_TABLET | Freq: Every day | ORAL | 3 refills | Status: DC

## 2020-01-22 DIAGNOSIS — H524 Presbyopia: Secondary | ICD-10-CM | POA: Diagnosis not present

## 2020-01-22 DIAGNOSIS — Z460 Encounter for fitting and adjustment of spectacles and contact lenses: Secondary | ICD-10-CM | POA: Diagnosis not present

## 2020-01-24 NOTE — Telephone Encounter (Signed)
Attempted to call pt several times but pt voicemail is not set up to leave a message will try later

## 2020-02-21 DIAGNOSIS — B0230 Zoster ocular disease, unspecified: Secondary | ICD-10-CM | POA: Diagnosis not present

## 2020-03-10 ENCOUNTER — Other Ambulatory Visit: Payer: Self-pay | Admitting: Family Medicine

## 2020-03-10 DIAGNOSIS — F339 Major depressive disorder, recurrent, unspecified: Secondary | ICD-10-CM

## 2020-03-11 NOTE — Telephone Encounter (Signed)
Okay for refill?  

## 2020-04-21 ENCOUNTER — Telehealth: Payer: Self-pay | Admitting: Family Medicine

## 2020-04-21 ENCOUNTER — Encounter: Payer: Self-pay | Admitting: Family Medicine

## 2020-04-21 DIAGNOSIS — R69 Illness, unspecified: Secondary | ICD-10-CM | POA: Diagnosis not present

## 2020-04-21 NOTE — Telephone Encounter (Signed)
Attempted to schedule AWV. Unable to LVM.  Will try at later time.  

## 2020-08-13 ENCOUNTER — Ambulatory Visit (INDEPENDENT_AMBULATORY_CARE_PROVIDER_SITE_OTHER): Payer: Medicare HMO

## 2020-08-13 ENCOUNTER — Other Ambulatory Visit: Payer: Self-pay

## 2020-08-13 VITALS — BP 111/77 | HR 90 | Temp 96.8°F

## 2020-08-13 DIAGNOSIS — Z0000 Encounter for general adult medical examination without abnormal findings: Secondary | ICD-10-CM | POA: Diagnosis not present

## 2020-08-13 NOTE — Progress Notes (Signed)
Virtual Visit via Telephone Note  I connected with  Colleen Reynolds on 08/13/20 at 10:15 AM EST by telephone and verified that I am speaking with the correct person using two identifiers.  Location: Patient: Home Provider: Office Persons participating in the virtual visit: patient/Nurse Health Advisor   I discussed the limitations, risks, security and privacy concerns of performing an evaluation and management service by telephone and the availability of in person appointments. The patient expressed understanding and agreed to proceed.  Interactive audio and video telecommunications were attempted between this nurse and patient, however failed, due to patient having technical difficulties OR patient did not have access to video capability.  We continued and completed visit with audio only.  Some vital signs may be absent or patient reported.   Willette Brace, LPN    Subjective:   Colleen Reynolds is a 75 y.o. female who presents for Medicare Annual (Subsequent) preventive examination.  Review of Systems     Cardiac Risk Factors include: advanced age (>42men, >44 women);obesity (BMI >30kg/m2)     Objective:    Today's Vitals   08/13/20 1044  BP: 111/77  Pulse: 90  Temp: (!) 96.8 F (36 C)  SpO2: 98%   There is no height or weight on file to calculate BMI.  Advanced Directives 08/13/2020 06/09/2019  Does Patient Have a Medical Advance Directive? Yes Yes  Type of Advance Directive Healthcare Power of [place] in Chart? No - copy requested -    Current Medications (verified) Outpatient Encounter Medications as of 08/13/2020  Medication Sig  . CALCIUM PO Take 400 mg by mouth 2 (two) times daily.  . Cholecalciferol (VITAMIN D PO) Take 1 capsule by mouth daily.   . famotidine (PEPCID) 20 MG tablet Take 20 mg by mouth daily.   . fexofenadine (ALLEGRA) 180 MG tablet Take 180 mg by mouth as needed for  allergies or rhinitis.   Marland Kitchen FLUoxetine (PROZAC) 40 MG capsule TAKE 1 CAPSULE BY MOUTH EVERY DAY  . prednisoLONE acetate (PRED FORTE) 1 % ophthalmic suspension Place 1 drop into the left eye daily.  . Red Yeast Rice 600 MG CAPS Take 1,200 mg by mouth daily.   . valACYclovir (VALTREX) 1000 MG tablet Take 1 tablet (1,000 mg total) by mouth daily.  . [DISCONTINUED] alendronate (FOSAMAX) 35 MG tablet TAKE 1 TAB BY MOUTH EVERY 7 DAYS. IN A.M. W/FULL GLASS OF WATER,EMPTY STOMACH, DO NOT EAT/DRINK/LAY DOWN FOR 30 MIN (Patient not taking: No sig reported)   Facility-Administered Encounter Medications as of 08/13/2020  Medication  . 0.9 %  sodium chloride infusion    Allergies (verified) Clindamycin/lincomycin, Lovastatin, and Penicillins   History: Past Medical History:  Diagnosis Date  . Depression   . Diverticulosis   . Kidney stones 2008  . OA (osteoarthritis) of hip   . Osteopenia   . Shingles   . Vitamin D deficiency    Past Surgical History:  Procedure Laterality Date  . ABDOMINAL HYSTERECTOMY    . BREAST REDUCTION SURGERY  1985  . COLONOSCOPY  2005  . FRACTURE SURGERY Left 2020   dislocated left elbow   . LITHOTRIPSY  2008   Family History  Problem Relation Age of Onset  . Colon cancer Neg Hx   . Esophageal cancer Neg Hx   . Rectal cancer Neg Hx   . Stomach cancer Neg Hx    Social History   Socioeconomic History  .  Marital status: Married    Spouse name: Not on file  . Number of children: Not on file  . Years of education: Not on file  . Highest education level: Not on file  Occupational History  . Not on file  Tobacco Use  . Smoking status: Never Smoker  . Smokeless tobacco: Never Used  Vaping Use  . Vaping Use: Never used  Substance and Sexual Activity  . Alcohol use: Yes    Comment: socially  . Drug use: No  . Sexual activity: Not on file  Other Topics Concern  . Not on file  Social History Narrative  . Not on file   Social Determinants of Health    Financial Resource Strain: Low Risk   . Difficulty of Paying Living Expenses: Not hard at all  Food Insecurity: No Food Insecurity  . Worried About Charity fundraiser in the Last Year: Never true  . Ran Out of Food in the Last Year: Never true  Transportation Needs: No Transportation Needs  . Lack of Transportation (Medical): No  . Lack of Transportation (Non-Medical): No  Physical Activity: Inactive  . Days of Exercise per Week: 0 days  . Minutes of Exercise per Session: 0 min  Stress: No Stress Concern Present  . Feeling of Stress : Not at all  Social Connections: Moderately Isolated  . Frequency of Communication with Friends and Family: More than three times a week  . Frequency of Social Gatherings with Friends and Family: Once a week  . Attends Religious Services: Never  . Active Member of Clubs or Organizations: No  . Attends Archivist Meetings: Never  . Marital Status: Married    Tobacco Counseling Counseling given: Not Answered   Clinical Intake:  Pre-visit preparation completed: Yes  Pain : No/denies pain     BMI - recorded: 30.02 Nutritional Status: BMI > 30  Obese Nutritional Risks: None Diabetes: No  How often do you need to have someone help you when you read instructions, pamphlets, or other written materials from your doctor or pharmacy?: 1 - Never  Diabetic?No  Interpreter Needed?: No  Information entered by :: Charlott Rakes, LPN   Activities of Daily Living In your present state of health, do you have any difficulty performing the following activities: 08/13/2020  Hearing? N  Vision? N  Difficulty concentrating or making decisions? N  Walking or climbing stairs? N  Dressing or bathing? N  Doing errands, shopping? N  Preparing Food and eating ? N  Using the Toilet? N  Managing your Medications? N  Managing your Finances? N  Housekeeping or managing your Housekeeping? N  Some recent data might be hidden    Patient Care  Team: Billie Ruddy, MD as PCP - General (Family Medicine)  Indicate any recent Medical Services you may have received from other than [facility] providers in the past year (date may be approximate).     Assessment:   This is a routine wellness examination for [place].  Hearing/Vision screen  Hearing Screening   125Hz  250Hz  500Hz  1000Hz  2000Hz  3000Hz  4000Hz  6000Hz  8000Hz   Right ear:           Left ear:           Comments: Pt denies any hearing   Vision Screening Comments: Pt follows up with Macarthur Critchley for annual eye exams  Dietary issues and exercise activities discussed: Current Exercise Habits: The patient does not participate in regular exercise at present  Goals    .  Patient Stated     Lose weight       Depression Screen PHQ 2/9 Scores 08/13/2020 05/19/2018 03/25/2017 03/25/2017  PHQ - 2 Score 0 2 1 0  PHQ- 9 Score - 10 3 -    Fall Risk Fall Risk  08/13/2020 03/25/2017  Falls in the past year? 1 No  Number falls in past yr: 1 -  Injury with Fall? 0 -  Risk for fall due to : History of fall(s);Impaired vision;Impaired balance/gait -  Follow up Falls prevention discussed -    FALL RISK PREVENTION PERTAINING TO THE HOME:  Any stairs in or around the home? Yes  If so, are there any without handrails? No  Home free of loose throw rugs in walkways, pet beds, electrical cords, etc? Yes  Adequate lighting in your home to reduce risk of falls? Yes   ASSISTIVE DEVICES UTILIZED TO PREVENT FALLS:  Life alert? No  Use of a cane, walker or w/c? Yes  Grab bars in the bathroom? Yes  Shower chair or bench in shower? Yes  Elevated toilet seat or a handicapped toilet? No   TIMED UP AND GO:  Was the test performed? No     Cognitive Function:     6CIT Screen 08/13/2020  What Year? 0 points  What month? 0 points  Count back from 20 0 points  Months in reverse 0 points  Repeat phrase 0 points    Immunizations Immunization History  Administered Date(s) Administered  .  Fluad Quad(high Dose 65+) 03/16/2019  . Influenza Split 06/12/2011  . Influenza, High Dose Seasonal PF 03/17/2016, 03/25/2017, 03/22/2018, 03/21/2019, 03/21/2019, 04/21/2020  . Influenza, Seasonal, Injecte, Preservative Fre 04/19/2015  . PFIZER(Purple Top)SARS-COV-2 Vaccination 09/15/2019, 10/09/2019, 04/21/2020  . Pneumococcal Conjugate-13 06/18/2016  . Pneumococcal Polysaccharide-23 11/09/2012, 03/22/2018  . Td 03/16/2001  . Tdap 08/26/2011, 10/20/2018    TDAP status: Up to date  Flu Vaccine status: Up to date  Done 04/21/20 Pneumococcal vaccine status: Up to date  Covid-19 vaccine status: Completed vaccines  Qualifies for Shingles Vaccine? Yes   Zostavax completed No   Shingrix Completed?: No.    Education has been provided regarding the importance of this vaccine. Patient has been advised to call insurance company to determine out of pocket expense if they have not yet received this vaccine. Advised may also receive vaccine at local pharmacy or Health Dept. Verbalized acceptance and understanding.  Screening Tests Health Maintenance  Topic Date Due  . TETANUS/TDAP  10/19/2028  . INFLUENZA VACCINE  Completed  . DEXA SCAN  Completed  . COVID-19 Vaccine  Completed  . Hepatitis C Screening  Completed  . PNA vac Low Risk Adult  Completed  . MAMMOGRAM  Discontinued  . COLONOSCOPY (Pts 45-69yrs Insurance coverage will need to be confirmed)  Discontinued    Health Maintenance  There are no preventive care reminders to display for this patient.  Colorectal cancer screening: Type of screening: Colonoscopy. Completed 11/17/16. Repeat every 1 years  Mammogram status: Completed 08/05/16. Repeat every year  Bone Density status: Completed 12/24/14. Results reflect: Bone density results: OSTEOPOROSIS. Repeat every 2 years.   Additional Screening:  Hepatitis C Screening:  Completed 07/06/15  Vision Screening: Recommended annual ophthalmology exams for early detection of glaucoma and  other disorders of the eye. Is the patient up to date with their annual eye exam?  Yes  Who is the provider or what is the name of the office in which the patient attends annual eye exams? Dr  Macarthur Critchley   Dental Screening: Recommended annual dental exams for proper oral hygiene  Community Resource Referral / Chronic Care Management: CRR required this visit?  No   CCM required this visit?  No      Plan:     I have personally reviewed and noted the following in the patient's chart:   . Medical and social history . Use of alcohol, tobacco or illicit drugs  . Current medications and supplements . Functional ability and status . Nutritional status . Physical activity . Advanced directives . List of other physicians . Hospitalizations, surgeries, and ER visits in previous 12 months . Vitals . Screenings to include cognitive, depression, and falls . Referrals and appointments  In addition, I have reviewed and discussed with patient certain preventive protocols, quality metrics, and best practice recommendations. A written personalized care plan for preventive services as well as general preventive health recommendations were provided to patient.     Willette Brace, LPN   12/09/7032   Nurse Notes: None

## 2020-08-13 NOTE — Patient Instructions (Addendum)
Colleen Reynolds , Thank you for taking time to come for your Medicare Wellness Visit. I appreciate your ongoing commitment to your health goals. Please review the following plan we discussed and let me know if I can assist you in the future.   Screening recommendations/referrals: Colonoscopy: No longer required  Mammogram: No longer required  Bone Density: Done 12/24/14 Recommended yearly ophthalmology/optometry visit for glaucoma screening and checkup Recommended yearly dental visit for hygiene and checkup  Vaccinations: Influenza vaccine: Done 04/21/20 Up to date Pneumococcal vaccine: Up to date Tdap vaccine: Up to date Shingles vaccine: Shingrix discussed. Please contact your pharmacy for coverage information.    Covid-19:Completed 3/13, 4/6, & 04/21/20  Advanced directives: Please bring a copy of your health care power of attorney and living will to the office at your convenience.  Conditions/risks identified: Lose weight   Next appointment: Follow up in one year for your annual wellness visit    Preventive Care 65 Years and Older, Female Preventive care refers to lifestyle choices and visits with your health care provider that can promote health and wellness. What does preventive care include?  A yearly physical exam. This is also called an annual well check.  Dental exams once or twice a year.  Routine eye exams. Ask your health care provider how often you should have your eyes checked.  Personal lifestyle choices, including:  Daily care of your teeth and gums.  Regular physical activity.  Eating a healthy diet.  Avoiding tobacco and drug use.  Limiting alcohol use.  Practicing safe sex.  Taking low-dose aspirin every day.  Taking vitamin and mineral supplements as recommended by your health care provider. What happens during an annual well check? The services and screenings done by your health care provider during your annual well check will depend on your age,  overall health, lifestyle risk factors, and family history of disease. Counseling  Your health care provider may ask you questions about your:  Alcohol use.  Tobacco use.  Drug use.  Emotional well-being.  Home and relationship well-being.  Sexual activity.  Eating habits.  History of falls.  Memory and ability to understand (cognition).  Work and work Statistician.  Reproductive health. Screening  You may have the following tests or measurements:  Height, weight, and BMI.  Blood pressure.  Lipid and cholesterol levels. These may be checked every 5 years, or more frequently if you are over 52 years old.  Skin check.  Lung cancer screening. You may have this screening every year starting at age 67 if you have a 30-pack-year history of smoking and currently smoke or have quit within the past 15 years.  Fecal occult blood test (FOBT) of the stool. You may have this test every year starting at age 40.  Flexible sigmoidoscopy or colonoscopy. You may have a sigmoidoscopy every 5 years or a colonoscopy every 10 years starting at age 46.  Hepatitis C blood test.  Hepatitis B blood test.  Sexually transmitted disease (STD) testing.  Diabetes screening. This is done by checking your blood sugar (glucose) after you have not eaten for a while (fasting). You may have this done every 1-3 years.  Bone density scan. This is done to screen for osteoporosis. You may have this done starting at age 47.  Mammogram. This may be done every 1-2 years (73) Talk to your health care provider about how often you should have regular mammograms. Talk with your health care provider about your test results, treatment options, and if necessary,  the need for more tests. Vaccines  Your health care provider may recommend certain vaccines, such as:  Influenza vaccine. This is recommended every year.  Tetanus, diphtheria, and acellular pertussis (Tdap, Td) vaccine. You may need a Td booster every 10  years.  Zoster vaccine. You may need this after age 66.  Pneumococcal 13-valent conjugate (PCV13) vaccine. One dose is recommended after age 8.  Pneumococcal polysaccharide (PPSV23) vaccine. One dose is recommended after age 75. Talk to your health care provider about which screenings and vaccines you need and how often you need them. This information is not intended to replace advice given to you by your health care provider. Make sure you discuss any questions you have with your health care provider. Document Released: 07/18/2015 Document Revised: 03/10/2016 Document Reviewed: 04/22/2015 Elsevier Interactive Patient Education  2017 [place] Prevention in the Home Falls can cause injuries. They can happen to people of all ages. There are many things you can do to make your home safe and to help prevent falls. What can I do on the outside of my home?  Regularly fix the edges of walkways and driveways and fix any cracks.  Remove anything that might make you trip as you walk through a door, such as a raised step or threshold.  Trim any bushes or trees on the path to your home.  Use bright outdoor lighting.  Clear any walking paths of anything that might make someone trip, such as rocks or tools.  Regularly check to see if handrails are loose or broken. Make sure that both sides of any steps have handrails.  Any raised decks and porches should have guardrails on the edges.  Have any leaves, snow, or ice cleared regularly.  Use sand or salt on walking paths during winter.  Clean up any spills in your garage right away. This includes oil or grease spills. What can I do in the bathroom?  Use night lights.  Install grab bars by the toilet and in the tub and shower. Do not use towel bars as grab bars.  Use non-skid mats or decals in the tub or shower.  If you need to sit down in the shower, use a plastic, non-slip stool.  Keep the floor dry. Clean up any water that  spills on the floor as soon as it happens.  Remove soap buildup in the tub or shower regularly.  Attach bath mats securely with double-sided non-slip rug tape.  Do not have throw rugs and other things on the floor that can make you trip. What can I do in the bedroom?  Use night lights.  Make sure that you have a light by your bed that is easy to reach.  Do not use any sheets or blankets that are too big for your bed. They should not hang down onto the floor.  Have a firm chair that has side arms. You can use this for support while you get dressed.  Do not have throw rugs and other things on the floor that can make you trip. What can I do in the kitchen?  Clean up any spills right away.  Avoid walking on wet floors.  Keep items that you use a lot in easy-to-reach places.  If you need to reach something above you, use a strong step stool that has a grab bar.  Keep electrical cords out of the way.  Do not use floor polish or wax that makes floors slippery. If you must use  wax, use non-skid floor wax.  Do not have throw rugs and other things on the floor that can make you trip. What can I do with my stairs?  Do not leave any items on the stairs.  Make sure that there are handrails on both sides of the stairs and use them. Fix handrails that are broken or loose. Make sure that handrails are as long as the stairways.  Check any carpeting to make sure that it is firmly attached to the stairs. Fix any carpet that is loose or worn.  Avoid having throw rugs at the top or bottom of the stairs. If you do have throw rugs, attach them to the floor with carpet tape.  Make sure that you have a light switch at the top of the stairs and the bottom of the stairs. If you do not have them, ask someone to add them for you. What else can I do to help prevent falls?  Wear shoes that:  Do not have high heels.  Have rubber bottoms.  Are comfortable and fit you well.  Are closed at the  toe. Do not wear sandals.  If you use a stepladder:  Make sure that it is fully opened. Do not climb a closed stepladder.  Make sure that both sides of the stepladder are locked into place.  Ask someone to hold it for you, if possible.  Clearly mark and make sure that you can see:  Any grab bars or handrails.  First and last steps.  Where the edge of each step is.  Use tools that help you move around (mobility aids) if they are needed. These include:  Canes.  Walkers.  Scooters.  Crutches.  Turn on the lights when you go into a dark area. Replace any light bulbs as soon as they burn out.  Set up your furniture so you have a clear path. Avoid moving your furniture around.  If any of your floors are uneven, fix them.  If there are any pets around you, be aware of where they are.  Review your medicines with your doctor. Some medicines can make you feel dizzy. This can increase your chance of falling. Ask your doctor what other things that you can do to help prevent falls. This information is not intended to replace advice given to you by your health care provider. Make sure you discuss any questions you have with your health care provider. Document Released: 04/17/2009 Document Revised: 11/27/2015 Document Reviewed: 07/26/2014 Elsevier Interactive Patient Education  2017 [employer].

## 2020-09-23 ENCOUNTER — Telehealth: Payer: Self-pay | Admitting: Family Medicine

## 2020-09-23 NOTE — Telephone Encounter (Signed)
Patient is calling and wanted to see if the provider would be ok reducing her Valtrex from 1000 mg to 500 mg and also patient wanted to switch the Valrex to [employer], please advise. Per patient it is ok to respond through mychart. CB is [phone]

## 2020-09-24 NOTE — Telephone Encounter (Signed)
Okay for the change? Or needs an appointment?

## 2020-09-26 NOTE — Telephone Encounter (Signed)
That is fine 

## 2020-09-28 ENCOUNTER — Encounter: Payer: Self-pay | Admitting: Family Medicine

## 2020-09-29 ENCOUNTER — Other Ambulatory Visit: Payer: Self-pay

## 2020-09-29 DIAGNOSIS — B009 Herpesviral infection, unspecified: Secondary | ICD-10-CM

## 2020-09-29 MED ORDER — VALACYCLOVIR HCL 1 G PO TABS: 500.0000 mg | ORAL_TABLET | Freq: Every day | ORAL | 3 refills | Status: DC

## 2020-09-29 NOTE — Telephone Encounter (Signed)
Rx changed per Dr. Volanda Napoleon.

## 2020-09-29 NOTE — Progress Notes (Signed)
Rx changed per Dr. Volanda Napoleon.

## 2020-11-30 DIAGNOSIS — A499 Bacterial infection, unspecified: Secondary | ICD-10-CM | POA: Diagnosis not present

## 2020-11-30 DIAGNOSIS — Z6828 Body mass index (BMI) 28.0-28.9, adult: Secondary | ICD-10-CM | POA: Diagnosis not present

## 2020-11-30 DIAGNOSIS — N390 Urinary tract infection, site not specified: Secondary | ICD-10-CM | POA: Diagnosis not present

## 2020-11-30 DIAGNOSIS — R300 Dysuria: Secondary | ICD-10-CM | POA: Diagnosis not present

## 2020-12-03 ENCOUNTER — Other Ambulatory Visit: Payer: Self-pay | Admitting: Family Medicine

## 2020-12-03 DIAGNOSIS — B009 Herpesviral infection, unspecified: Secondary | ICD-10-CM

## 2020-12-07 ENCOUNTER — Encounter: Payer: Self-pay | Admitting: Family Medicine

## 2020-12-07 DIAGNOSIS — B009 Herpesviral infection, unspecified: Secondary | ICD-10-CM

## 2020-12-10 MED ORDER — VALACYCLOVIR HCL 1 G PO TABS: 1000.0000 mg | ORAL_TABLET | Freq: Every day | ORAL | 3 refills | Status: DC

## 2021-02-19 ENCOUNTER — Encounter: Payer: Self-pay | Admitting: Family Medicine

## 2021-02-19 DIAGNOSIS — R239 Unspecified skin changes: Secondary | ICD-10-CM

## 2021-02-19 DIAGNOSIS — L299 Pruritus, unspecified: Secondary | ICD-10-CM

## 2021-02-24 DIAGNOSIS — Z0100 Encounter for examination of eyes and vision without abnormal findings: Secondary | ICD-10-CM | POA: Diagnosis not present

## 2021-02-24 DIAGNOSIS — H5203 Hypermetropia, bilateral: Secondary | ICD-10-CM | POA: Diagnosis not present

## 2021-04-02 ENCOUNTER — Other Ambulatory Visit: Payer: Self-pay | Admitting: Family Medicine

## 2021-04-02 DIAGNOSIS — F339 Major depressive disorder, recurrent, unspecified: Secondary | ICD-10-CM

## 2021-04-09 DIAGNOSIS — L298 Other pruritus: Secondary | ICD-10-CM | POA: Diagnosis not present

## 2021-04-20 ENCOUNTER — Encounter: Payer: Self-pay | Admitting: Family Medicine

## 2021-04-21 ENCOUNTER — Other Ambulatory Visit: Payer: Self-pay

## 2021-04-22 ENCOUNTER — Ambulatory Visit (INDEPENDENT_AMBULATORY_CARE_PROVIDER_SITE_OTHER): Payer: Medicare HMO | Admitting: Family Medicine

## 2021-04-22 ENCOUNTER — Encounter: Payer: Self-pay | Admitting: Family Medicine

## 2021-04-22 DIAGNOSIS — I4891 Unspecified atrial fibrillation: Secondary | ICD-10-CM

## 2021-04-22 LAB — COMPREHENSIVE METABOLIC PANEL
ALT: 15 U/L (ref 0–35)
AST: 17 U/L (ref 0–37)
Albumin: 4.2 g/dL (ref 3.5–5.2)
Alkaline Phosphatase: 85 U/L (ref 39–117)
BUN: 15 mg/dL (ref 6–23)
CO2: 24 mEq/L (ref 19–32)
Calcium: 10 mg/dL (ref 8.4–10.5)
Chloride: 105 mEq/L (ref 96–112)
Creatinine, Ser: 0.99 mg/dL (ref 0.40–1.20)
GFR: 56.57 mL/min — ABNORMAL LOW (ref >60.00)
Glucose, Bld: 102 mg/dL — ABNORMAL HIGH (ref 70–99)
Potassium: 4.2 mEq/L (ref 3.5–5.1)
Sodium: 138 mEq/L (ref 135–145)
Total Bilirubin: 1 mg/dL (ref 0.2–1.2)
Total Protein: 7.3 g/dL (ref 6.0–8.3)

## 2021-04-22 LAB — CBC WITH DIFFERENTIAL/PLATELET
Basophils Absolute: 0.1 10*3/uL (ref 0.0–0.1)
Basophils Relative: 1 % (ref 0.0–3.0)
Eosinophils Absolute: 0.2 10*3/uL (ref 0.0–0.7)
Eosinophils Relative: 4.1 % (ref 0.0–5.0)
HCT: 45.2 % (ref 36.0–46.0)
Hemoglobin: 15.2 g/dL — ABNORMAL HIGH (ref 12.0–15.0)
Lymphocytes Relative: 37.9 % (ref 12.0–46.0)
Lymphs Abs: 2.2 10*3/uL (ref 0.7–4.0)
MCHC: 33.6 g/dL (ref 30.0–36.0)
MCV: 95 fl (ref 78.0–100.0)
Monocytes Absolute: 0.5 10*3/uL (ref 0.1–1.0)
Monocytes Relative: 9 % (ref 3.0–12.0)
Neutro Abs: 2.8 10*3/uL (ref 1.4–7.7)
Neutrophils Relative %: 48 % (ref 43.0–77.0)
Platelets: 274 10*3/uL (ref 150.0–400.0)
RBC: 4.75 Mil/uL (ref 3.87–5.11)
RDW: 13.5 % (ref 11.5–15.5)
WBC: 5.7 10*3/uL (ref 4.0–10.5)

## 2021-04-22 LAB — TSH: TSH: 1.77 u[IU]/mL (ref 0.35–5.50)

## 2021-04-22 LAB — HEMOGLOBIN A1C: Hgb A1c MFr Bld: 5.4 % (ref 4.6–6.5)

## 2021-04-22 LAB — T4, FREE: Free T4: 0.81 ng/dL (ref 0.60–1.60)

## 2021-04-22 MED ORDER — CARVEDILOL 3.125 MG PO TABS: 3.1250 mg | ORAL_TABLET | Freq: Two times a day (BID) | ORAL | 2 refills | Status: DC

## 2021-04-22 MED ORDER — APIXABAN (ELIQUIS) VTE STARTER PACK (10MG AND 5MG): ORAL_TABLET | ORAL | 0 refills | Status: DC

## 2021-04-22 NOTE — Progress Notes (Addendum)
Subjective:    Patient ID: Colleen Reynolds, female    DOB: March 12, 1946, 75 y.o.   MRN: [id number]  Chief Complaint  Patient presents with   Atrial Fibrillation    HPI Patient was seen today for ongoing concerns about heart rhythm.  Pt notes her apple watch has notified her she was in afib off and on since July 30th.  Pt states the afib was noted to last a minute or so.  Had an episode in August and 6x in Sept.  Pt denies feeling palpitations, lightheadedness, or fatigue.  Denies increased caffeine intake.    Past Medical History:  Diagnosis Date   Depression    Diverticulosis    Kidney stones 2008   OA (osteoarthritis) of hip    Osteopenia    Shingles    Vitamin D deficiency     Allergies  Allergen Reactions   Clindamycin/Lincomycin Nausea And Vomiting   Lovastatin Other (See Comments)    Fatigue, muscle aches   Penicillins     ROS General: Denies fever, chills, night sweats, changes in weight, changes in appetite HEENT: Denies headaches, ear pain, changes in vision, rhinorrhea, sore throat CV: Denies CP, palpitations, SOB, orthopnea Pulm: Denies SOB, cough, wheezing GI: Denies abdominal pain, nausea, vomiting, diarrhea, constipation GU: Denies dysuria, hematuria, frequency, vaginal discharge Msk: Denies muscle cramps, joint pains Neuro: Denies weakness, numbness, tingling Skin: Denies rashes, bruising Psych: Denies depression, anxiety, hallucinations     Objective:    Blood pressure (P) 120/78, pulse (!) (P) 119, temperature (P) 98.3 F (36.8 C), temperature source (P) Oral, weight (P) 177 lb 6.4 oz (80.5 kg), SpO2 (P) 99 %.  Gen. Pleasant, well-nourished, in no distress, normal affect   HEENT: [place]/AT, face symmetric, conjunctiva clear, no scleral icterus, PERRLA, EOMI, nares patent without drainage, pharynx without erythema or exudate. Neck: No JVD, no thyromegaly, no carotid bruits Lungs: no accessory muscle use, CTAB, no wheezes or rales Cardiovascular:  Irregularly irregular, no r/g/m, no peripheral edema Musculoskeletal: No deformities, no cyanosis or clubbing, normal tone Neuro:  A&Ox3, CN II-XII intact, normal gait Skin:  Warm, no lesions/ rash   Wt Readings from Last 3 Encounters:  04/22/21 (P) 177 lb 6.4 oz (80.5 kg)  11/30/19 186 lb (84.4 kg)  06/15/19 180 lb (81.6 kg)    Lab Results  Component Value Date   WBC 7.2 03/23/2018   HGB 14.8 03/23/2018   HCT 43.3 03/23/2018   PLT 242.0 03/23/2018   GLUCOSE 91 03/23/2018   CHOL 242 (H) 03/23/2018   TRIG 96.0 03/23/2018   HDL 63.80 03/23/2018   LDLCALC 159 (H) 03/23/2018   NA 138 03/23/2018   K 4.1 03/23/2018   CL 105 03/23/2018   CREATININE 0.92 03/23/2018   BUN 19 03/23/2018   CO2 25 03/23/2018    Assessment/Plan:  New onset a-fib ([place])  -intermittent  -EKG with afib, a PVC this visit.  No prior studies for comparison. -CHA2DS2-VASc score 2 with 2.2% per yr CVA risk -HASBLED score 1 - Plan: CMP, TSH, CBC with Differential/Platelet, carvedilol (COREG) 3.125 MG tablet, APIXABAN (ELIQUIS) VTE STARTER PACK (10MG  AND 5MG ), T4, Free, Hemoglobin A1c, ECHOCARDIOGRAM COMPLETE, Ambulatory referral to Cardiology  F/u in 1 month, sooner if needed.  Grier Mitts, MD

## 2021-05-04 DIAGNOSIS — I4891 Unspecified atrial fibrillation: Secondary | ICD-10-CM | POA: Insufficient documentation

## 2021-05-14 ENCOUNTER — Ambulatory Visit (INDEPENDENT_AMBULATORY_CARE_PROVIDER_SITE_OTHER): Payer: Medicare HMO | Admitting: Family Medicine

## 2021-05-14 VITALS — BP 130/86 | HR 92 | Temp 98.0°F | Wt 181.6 lb

## 2021-05-14 DIAGNOSIS — L299 Pruritus, unspecified: Secondary | ICD-10-CM

## 2021-05-14 DIAGNOSIS — M25559 Pain in unspecified hip: Secondary | ICD-10-CM | POA: Diagnosis not present

## 2021-05-14 DIAGNOSIS — I4891 Unspecified atrial fibrillation: Secondary | ICD-10-CM

## 2021-05-14 DIAGNOSIS — E782 Mixed hyperlipidemia: Secondary | ICD-10-CM | POA: Diagnosis not present

## 2021-05-14 DIAGNOSIS — Z0000 Encounter for general adult medical examination without abnormal findings: Secondary | ICD-10-CM | POA: Diagnosis not present

## 2021-05-14 DIAGNOSIS — Z23 Encounter for immunization: Secondary | ICD-10-CM | POA: Diagnosis not present

## 2021-05-14 DIAGNOSIS — R232 Flushing: Secondary | ICD-10-CM | POA: Diagnosis not present

## 2021-05-14 DIAGNOSIS — R42 Dizziness and giddiness: Secondary | ICD-10-CM | POA: Diagnosis not present

## 2021-05-14 DIAGNOSIS — M810 Age-related osteoporosis without current pathological fracture: Secondary | ICD-10-CM | POA: Diagnosis not present

## 2021-05-14 MED ORDER — APIXABAN 5 MG PO TABS: 5.0000 mg | ORAL_TABLET | Freq: Two times a day (BID) | ORAL | 11 refills | Status: DC

## 2021-05-31 ENCOUNTER — Encounter: Payer: Self-pay | Admitting: Family Medicine

## 2021-05-31 NOTE — Progress Notes (Signed)
Subjective:     Colleen Reynolds is a 75 y.o. female and is here for a comprehensive physical exam. Pt had AWV this yr.  The patient reports several concerns.  Pt having mild vertigo symptoms with change in position x 3 months.  Using a walking stick to aid with ambulation.  Having hip pain.  Feels like hip gets stuck.  Seen by Ortho. Inquires about handicap placard.  Tried gabapentin, amitriptyline and lidocaine cream.  Advised by Derm she has brachial radial pruritus.  Also notes rash with crusty flat area. Bp at home typically 90/60, 102/70 since starting Coreg 3.125 mg BID for afib.  Pt notes 4 episodes of afib per watch since starting beta blocker.  Also on eliquis.  Denies easy bruising or any new issues since starting the medications. Pt request refill on valtrex.  Having hot flashes.  States stress is up but depression is down.  Taking Cranberry flow 7-nitric oxide.  Social History   Socioeconomic History   Marital status: Married    Spouse name: Not on file   Number of children: Not on file   Years of education: Not on file   Highest education level: Not on file  Occupational History   Not on file  Tobacco Use   Smoking status: Never   Smokeless tobacco: Never  Vaping Use   Vaping Use: Never used  Substance and Sexual Activity   Alcohol use: Yes    Comment: socially   Drug use: No   Sexual activity: Not on file  Other Topics Concern   Not on file  Social History Narrative   Not on file   Social Determinants of Health   Financial Resource Strain: Low Risk    Difficulty of Paying Living Expenses: Not hard at all  Food Insecurity: No Food Insecurity   Worried About Charity fundraiser in the Last Year: Never true   [place] in the Last Year: Never true  Transportation Needs: No Transportation Needs   Lack of Transportation (Medical): No   Lack of Transportation (Non-Medical): No  Physical Activity: Inactive   Days of Exercise per Week: 0 days   Minutes of  Exercise per Session: 0 min  Stress: No Stress Concern Present   Feeling of Stress : Not at all  Social Connections: Moderately Isolated   Frequency of Communication with Friends and Family: More than three times a week   Frequency of Social Gatherings with Friends and Family: Once a week   Attends Religious Services: Never   Marine scientist or Organizations: No   Attends Music therapist: Never   Marital Status: Married  Human resources officer Violence: Not At Risk   Fear of Current or Ex-Partner: No   Emotionally Abused: No   Physically Abused: No   Sexually Abused: No   Health Maintenance  Topic Date Due   Zoster Vaccines- Shingrix (1 of 2) Never done   COVID-19 Vaccine (5 - Booster for Pfizer series) 12/21/2020   TETANUS/TDAP  10/19/2028   Pneumonia Vaccine 71+ Years old  Completed   INFLUENZA VACCINE  Completed   DEXA SCAN  Completed   Hepatitis C Screening  Completed   HPV VACCINES  Aged Out   MAMMOGRAM  Discontinued   COLONOSCOPY (Pts 45-60yrs Insurance coverage will need to be confirmed)  Discontinued    The following portions of the patient's history were reviewed and updated as appropriate: allergies, current medications, past family history, past medical  history, past social history, past surgical history, and problem list.  Review of Systems Pertinent items noted in HPI and remainder of comprehensive ROS otherwise negative.   Objective:    BP 130/86 (BP Location: Right Arm, Patient Position: Sitting, Cuff Size: Normal)   Pulse 92   Temp 98 F (36.7 C) (Oral)   Wt 181 lb 9.6 oz (82.4 kg)   SpO2 98%   BMI 29.31 kg/m  General appearance: alert, cooperative, and no distress Head: Normocephalic, without obvious abnormality, atraumatic Eyes: conjunctivae/corneas clear. PERRL, EOM's intact. Fundi benign. Ears: normal TM's and external ear canals both ears Nose: Nares normal. Septum midline. Mucosa normal. No drainage or sinus tenderness. Throat:  lips, mucosa, and tongue normal; teeth and gums normal Neck: no adenopathy, no carotid bruit, no JVD, supple, symmetrical, trachea midline, and thyroid not enlarged, symmetric, no tenderness/mass/nodules Lungs: clear to auscultation bilaterally Heart: regular rate and rhythm, S1, S2 normal, no murmur, click, rub or gallop Abdomen: soft, non-tender; bowel sounds normal; no masses,  no organomegaly Extremities: extremities normal, atraumatic, no cyanosis or edema Pulses: 2+ and symmetric Skin: Skin color, texture, turgor normal. No rashes or lesions Lymph nodes: Cervical, supraclavicular, and axillary nodes normal. Neurologic: Alert and oriented X 3, normal strength and tone. Normal symmetric reflexes. Normal coordination and gait    Assessment:    Healthy female exam.      Plan:    Anticipatory guidance given including wearing seatbelts, smoke detectors in the home, increasing physical activity, increasing p.o. intake of water and vegetables. -labs -colonoscopy done 11/17/16 -Advised to schedule mammogram.  Last done 08/05/16. -Discussed immunizations. -Given handout -Next CPE in 1 yr See After Visit Summary for Counseling Recommendations   Atrial fibrillation, unspecified type ([place])  -stable.  Asymptomatic -noted on EKG during last OFV 04/22/21 -continue eliquis and coreg 3.125 mg. BID.  -monitor for hypotension as bp was low normal prior to starting coreg. - Plan: apixaban (ELIQUIS) 5 MG TABS tablet  Brachioradial pruritus -continue f/u with Dermatology  Vertigo  - Plan: PT vestibular rehab  Age-related osteoporosis without current pathological fracture  -wt bearing exercise -maximize calcium and vitamin d intake from diet. - Plan: Vitamin D, 25-hydroxy  Need for influenza vaccination  - Plan: Flu Vaccine QUAD High Dose(Fluad)  Mixed hyperlipidemia  -lovastatin allergy - Plan: Lipid panel  Hot flashes -consider OTC black coash  Hip pain -possibly 2/2 OA   -continue f/u with Ortho -in the future would advised pt to have handicap form completed by Ortho as they are following her for this.  F/u prn  Grier Mitts, MD

## 2021-06-15 ENCOUNTER — Other Ambulatory Visit: Payer: Self-pay | Admitting: Family Medicine

## 2021-06-15 DIAGNOSIS — I4891 Unspecified atrial fibrillation: Secondary | ICD-10-CM

## 2021-06-30 ENCOUNTER — Other Ambulatory Visit: Payer: Self-pay | Admitting: Family Medicine

## 2021-06-30 DIAGNOSIS — F339 Major depressive disorder, recurrent, unspecified: Secondary | ICD-10-CM

## 2021-07-09 ENCOUNTER — Emergency Department (HOSPITAL_BASED_OUTPATIENT_CLINIC_OR_DEPARTMENT_OTHER): Payer: Medicare HMO

## 2021-07-09 ENCOUNTER — Encounter (HOSPITAL_BASED_OUTPATIENT_CLINIC_OR_DEPARTMENT_OTHER): Payer: Self-pay | Admitting: Emergency Medicine

## 2021-07-09 ENCOUNTER — Other Ambulatory Visit: Payer: Self-pay

## 2021-07-09 ENCOUNTER — Inpatient Hospital Stay (HOSPITAL_BASED_OUTPATIENT_CLINIC_OR_DEPARTMENT_OTHER)
Admission: EM | Admit: 2021-07-09 | Discharge: 2021-07-13 | DRG: 854 | Disposition: A | Discharge status: Home / Self Care | Payer: Medicare HMO | Attending: Internal Medicine | Admitting: Internal Medicine

## 2021-07-09 DIAGNOSIS — Z881 Allergy status to other antibiotic agents status: Secondary | ICD-10-CM | POA: Diagnosis not present

## 2021-07-09 DIAGNOSIS — N12 Tubulo-interstitial nephritis, not specified as acute or chronic: Secondary | ICD-10-CM

## 2021-07-09 DIAGNOSIS — E860 Dehydration: Secondary | ICD-10-CM | POA: Diagnosis present

## 2021-07-09 DIAGNOSIS — Z87442 Personal history of urinary calculi: Secondary | ICD-10-CM

## 2021-07-09 DIAGNOSIS — M79604 Pain in right leg: Secondary | ICD-10-CM | POA: Diagnosis not present

## 2021-07-09 DIAGNOSIS — I482 Chronic atrial fibrillation, unspecified: Secondary | ICD-10-CM | POA: Diagnosis present

## 2021-07-09 DIAGNOSIS — I4891 Unspecified atrial fibrillation: Secondary | ICD-10-CM

## 2021-07-09 DIAGNOSIS — Z90710 Acquired absence of both cervix and uterus: Secondary | ICD-10-CM

## 2021-07-09 DIAGNOSIS — Z79899 Other long term (current) drug therapy: Secondary | ICD-10-CM

## 2021-07-09 DIAGNOSIS — N136 Pyonephrosis: Secondary | ICD-10-CM | POA: Diagnosis present

## 2021-07-09 DIAGNOSIS — Z888 Allergy status to other drugs, medicaments and biological substances status: Secondary | ICD-10-CM

## 2021-07-09 DIAGNOSIS — Z20822 Contact with and (suspected) exposure to covid-19: Secondary | ICD-10-CM | POA: Diagnosis present

## 2021-07-09 DIAGNOSIS — N200 Calculus of kidney: Secondary | ICD-10-CM | POA: Diagnosis not present

## 2021-07-09 DIAGNOSIS — K449 Diaphragmatic hernia without obstruction or gangrene: Secondary | ICD-10-CM | POA: Diagnosis not present

## 2021-07-09 DIAGNOSIS — Z880 Allergy status to penicillin: Secondary | ICD-10-CM | POA: Diagnosis not present

## 2021-07-09 DIAGNOSIS — A4151 Sepsis due to Escherichia coli [E. coli]: Principal | ICD-10-CM | POA: Diagnosis present

## 2021-07-09 DIAGNOSIS — F32A Depression, unspecified: Secondary | ICD-10-CM | POA: Diagnosis present

## 2021-07-09 DIAGNOSIS — R109 Unspecified abdominal pain: Secondary | ICD-10-CM | POA: Diagnosis not present

## 2021-07-09 DIAGNOSIS — R7881 Bacteremia: Secondary | ICD-10-CM | POA: Diagnosis not present

## 2021-07-09 DIAGNOSIS — K5730 Diverticulosis of large intestine without perforation or abscess without bleeding: Secondary | ICD-10-CM | POA: Diagnosis not present

## 2021-07-09 DIAGNOSIS — R69 Illness, unspecified: Secondary | ICD-10-CM | POA: Diagnosis not present

## 2021-07-09 DIAGNOSIS — I959 Hypotension, unspecified: Secondary | ICD-10-CM | POA: Diagnosis not present

## 2021-07-09 DIAGNOSIS — A419 Sepsis, unspecified organism: Secondary | ICD-10-CM | POA: Diagnosis not present

## 2021-07-09 DIAGNOSIS — R000 Tachycardia, unspecified: Secondary | ICD-10-CM | POA: Diagnosis not present

## 2021-07-09 DIAGNOSIS — Z7901 Long term (current) use of anticoagulants: Secondary | ICD-10-CM

## 2021-07-09 DIAGNOSIS — N10 Acute tubulo-interstitial nephritis: Secondary | ICD-10-CM

## 2021-07-09 DIAGNOSIS — M8580 Other specified disorders of bone density and structure, unspecified site: Secondary | ICD-10-CM | POA: Diagnosis present

## 2021-07-09 LAB — COMPREHENSIVE METABOLIC PANEL
ALT: 19 U/L (ref 0–44)
AST: 22 U/L (ref 15–41)
Albumin: 3.9 g/dL (ref 3.5–5.0)
Alkaline Phosphatase: 86 U/L (ref 38–126)
Anion gap: 15 (ref 5–15)
BUN: 18 mg/dL (ref 8–23)
CO2: 16 mmol/L — ABNORMAL LOW (ref 22–32)
Calcium: 9.3 mg/dL (ref 8.9–10.3)
Chloride: 102 mmol/L (ref 98–111)
Creatinine, Ser: 1.17 mg/dL — ABNORMAL HIGH (ref 0.44–1.00)
GFR, Estimated: 49 mL/min — ABNORMAL LOW (ref >60)
Glucose, Bld: 181 mg/dL — ABNORMAL HIGH (ref 70–99)
Potassium: 3.6 mmol/L (ref 3.5–5.1)
Sodium: 133 mmol/L — ABNORMAL LOW (ref 135–145)
Total Bilirubin: 2.5 mg/dL — ABNORMAL HIGH (ref 0.3–1.2)
Total Protein: 7.2 g/dL (ref 6.5–8.1)

## 2021-07-09 LAB — URINALYSIS, ROUTINE W REFLEX MICROSCOPIC
Bilirubin Urine: NEGATIVE
Cellular Cast, UA: 11
Glucose, UA: 100 mg/dL — AB
Ketones, ur: 15 mg/dL — AB
Nitrite: NEGATIVE
Protein, ur: 30 mg/dL — AB
Specific Gravity, Urine: 1.02 (ref 1.005–1.030)
WBC, UA: >50 WBC/hpf — ABNORMAL HIGH (ref 0–5)
pH: 5.5 (ref 5.0–8.0)

## 2021-07-09 LAB — LIPASE, BLOOD: Lipase: <10 U/L — ABNORMAL LOW (ref 11–51)

## 2021-07-09 LAB — RESP PANEL BY RT-PCR (FLU A&B, COVID) ARPGX2
Influenza A by PCR: NEGATIVE
Influenza B by PCR: NEGATIVE
SARS Coronavirus 2 by RT PCR: NEGATIVE

## 2021-07-09 LAB — CBC
HCT: 41.4 % (ref 36.0–46.0)
Hemoglobin: 14.2 g/dL (ref 12.0–15.0)
MCH: 31.6 pg (ref 26.0–34.0)
MCHC: 34.3 g/dL (ref 30.0–36.0)
MCV: 92 fL (ref 80.0–100.0)
Platelets: 149 10*3/uL — ABNORMAL LOW (ref 150–400)
RBC: 4.5 MIL/uL (ref 3.87–5.11)
RDW: 13.5 % (ref 11.5–15.5)
WBC: 4.7 10*3/uL (ref 4.0–10.5)
nRBC: 0 % (ref 0.0–0.2)

## 2021-07-09 LAB — LACTIC ACID, PLASMA
Lactic Acid, Venous: 3.4 mmol/L (ref 0.5–1.9)
Lactic Acid, Venous: 2.7 mmol/L (ref 0.5–1.9)

## 2021-07-09 LAB — MRSA NEXT GEN BY PCR, NASAL: MRSA by PCR Next Gen: NOT DETECTED

## 2021-07-09 MED ORDER — LACTATED RINGERS IV BOLUS (SEPSIS)
1000.0000 mL | Freq: Once | INTRAVENOUS | Status: AC
  Administered 2021-07-09: 1000 mL via INTRAVENOUS

## 2021-07-09 MED ORDER — ACETAMINOPHEN 650 MG RE SUPP: 650.0000 mg | Freq: Four times a day (QID) | RECTAL | Status: DC | PRN

## 2021-07-09 MED ORDER — LACTATED RINGERS IV SOLN: INTRAVENOUS | Status: DC

## 2021-07-09 MED ORDER — SODIUM CHLORIDE 0.9 % IV SOLN
2.0000 g | Freq: Two times a day (BID) | INTRAVENOUS | Status: DC
  Administered 2021-07-09: 2 g via INTRAVENOUS
  Filled 2021-07-09: qty 2

## 2021-07-09 MED ORDER — METOPROLOL TARTRATE 5 MG/5ML IV SOLN
2.5000 mg | Freq: Four times a day (QID) | INTRAVENOUS | Status: DC
  Administered 2021-07-09 – 2021-07-10 (×2): 2.5 mg via INTRAVENOUS
  Filled 2021-07-09 (×2): qty 5

## 2021-07-09 MED ORDER — FAMOTIDINE 20 MG PO TABS
20.0000 mg | ORAL_TABLET | Freq: Every day | ORAL | Status: DC
  Administered 2021-07-10 – 2021-07-13 (×4): 20 mg via ORAL
  Filled 2021-07-09 (×4): qty 1

## 2021-07-09 MED ORDER — SODIUM CHLORIDE 0.9 % IV BOLUS (SEPSIS)
1000.0000 mL | Freq: Once | INTRAVENOUS | Status: AC
  Administered 2021-07-09: 1000 mL via INTRAVENOUS

## 2021-07-09 MED ORDER — SODIUM CHLORIDE 0.9 % IV BOLUS (SEPSIS): 1000.0000 mL | Freq: Once | INTRAVENOUS | Status: DC

## 2021-07-09 MED ORDER — LACTATED RINGERS IV BOLUS (SEPSIS)
500.0000 mL | Freq: Once | INTRAVENOUS | Status: AC
  Administered 2021-07-09: 500 mL via INTRAVENOUS

## 2021-07-09 MED ORDER — ACETAMINOPHEN 325 MG PO TABS
650.0000 mg | ORAL_TABLET | Freq: Four times a day (QID) | ORAL | Status: DC | PRN
  Administered 2021-07-10 – 2021-07-13 (×6): 650 mg via ORAL
  Filled 2021-07-09 (×6): qty 2

## 2021-07-09 MED ORDER — SODIUM CHLORIDE 0.9 % IV SOLN
2.0000 g | Freq: Once | INTRAVENOUS | Status: AC
  Administered 2021-07-09: 2 g via INTRAVENOUS
  Filled 2021-07-09: qty 2

## 2021-07-09 MED ORDER — FENTANYL CITRATE PF 50 MCG/ML IJ SOSY
50.0000 ug | PREFILLED_SYRINGE | INTRAMUSCULAR | Status: DC | PRN
  Administered 2021-07-09: 50 ug via INTRAVENOUS
  Filled 2021-07-09: qty 1

## 2021-07-09 MED ORDER — ONDANSETRON HCL 4 MG/2ML IJ SOLN
4.0000 mg | Freq: Four times a day (QID) | INTRAMUSCULAR | Status: DC | PRN
  Administered 2021-07-10: 4 mg via INTRAVENOUS
  Filled 2021-07-09: qty 2

## 2021-07-09 MED ORDER — SODIUM CHLORIDE 0.9 % IV SOLN: 1000.0000 mL | INTRAVENOUS | Status: DC

## 2021-07-09 MED ORDER — CHLORHEXIDINE GLUCONATE CLOTH 2 % EX PADS
6.0000 | MEDICATED_PAD | Freq: Every day | CUTANEOUS | Status: DC
  Administered 2021-07-10 – 2021-07-11 (×2): 6 via TOPICAL

## 2021-07-09 MED ORDER — ONDANSETRON HCL 4 MG PO TABS: 4.0000 mg | ORAL_TABLET | Freq: Four times a day (QID) | ORAL | Status: DC | PRN

## 2021-07-09 MED ORDER — FLUOXETINE HCL 20 MG PO CAPS
40.0000 mg | ORAL_CAPSULE | Freq: Every day | ORAL | Status: DC
  Administered 2021-07-10 – 2021-07-13 (×4): 40 mg via ORAL
  Filled 2021-07-09 (×4): qty 2

## 2021-07-09 MED ORDER — FENTANYL CITRATE (PF) 100 MCG/2ML IJ SOLN
12.5000 ug | Freq: Once | INTRAMUSCULAR | Status: AC
  Administered 2021-07-09: 12.5 ug via INTRAVENOUS
  Filled 2021-07-09: qty 2

## 2021-07-09 MED ORDER — MORPHINE SULFATE (PF) 2 MG/ML IV SOLN
2.0000 mg | INTRAVENOUS | Status: DC | PRN
  Administered 2021-07-09 – 2021-07-11 (×2): 2 mg via INTRAVENOUS
  Filled 2021-07-09 (×3): qty 1

## 2021-07-09 MED ORDER — LACTATED RINGERS IV BOLUS: 1000.0000 mL | Freq: Once | INTRAVENOUS | Status: DC

## 2021-07-09 MED ORDER — LACTATED RINGERS IV BOLUS: 2000.0000 mL | Freq: Once | INTRAVENOUS | Status: DC

## 2021-07-09 NOTE — Sepsis Progress Note (Signed)
Sepsis tracking complete. Six hour Sepsis bundle complete.

## 2021-07-09 NOTE — H&P (Signed)
History and Physical    Colleen Reynolds PYP:[id number] DOB: [birth year] DOA: 07/09/2021  PCP: Billie Ruddy, MD  Patient coming from: home  I have personally briefly reviewed patient's old medical records in [place]  Chief Complaint: right flank pain, vomiting  HPI: Colleen Reynolds is a 76 y.o. female with medical history significant of chronic atrial fibrillation on anticoagulation, previous history of kidney stones, who presents to the hospital with right flank pain.  Reports that symptoms started just over 24 hours ago.  Discomfort started in right flank as well as right abdomen.  She progressively became nauseous and had intractable vomiting.  She was unable to keep anything down.  She felt increasingly weak and lightheaded.  She also experienced chills.  She denied any chest pain or shortness of breath.  She has not had any diarrhea.  ED Course: She was evaluated the emergency room where she wasnoted to be hypotensive and tachycardic.  EKG showed rapid atrial fibrillation.  Lactic acid noted to be elevated.  Urinalysis indicated possible infection.  CT abdomen did show concern for perinephric stranding as well as renal calculus.  She was started on IV fluids per sepsis protocol.  She was started on IV antibiotics.  Case was discussed with urology.  She was admitted for further management.  Review of Systems: As per HPI otherwise 10 point review of systems negative.    Past Medical History:  Diagnosis Date   Depression    Diverticulosis    Kidney stones 2008   OA (osteoarthritis) of hip    Osteopenia    Shingles    Vitamin D deficiency     Past Surgical History:  Procedure Laterality Date   ABDOMINAL HYSTERECTOMY     BREAST REDUCTION SURGERY  1985   COLONOSCOPY  2005   FRACTURE SURGERY Left 2020   dislocated left elbow    LITHOTRIPSY  2008    Social History:  reports that she has never smoked. She has never used smokeless tobacco. She reports current  alcohol use. She reports that she does not use drugs.  Allergies  Allergen Reactions   Clindamycin/Lincomycin Nausea And Vomiting   Lovastatin Other (See Comments)    Fatigue, muscle aches   Penicillins     Family History  Problem Relation Age of Onset   Colon cancer Neg Hx    Esophageal cancer Neg Hx    Rectal cancer Neg Hx    Stomach cancer Neg Hx     Prior to Admission medications   Medication Sig Start Date End Date Taking? Authorizing Provider  apixaban (ELIQUIS) 5 MG TABS tablet Take 1 tablet (5 mg total) by mouth 2 (two) times daily. 05/14/21  Yes Billie Ruddy, MD  carvedilol (COREG) 3.125 MG tablet TAKE 1 TABLET BY MOUTH 2 TIMES DAILY WITH A MEAL. 06/15/21  Yes Billie Ruddy, MD  famotidine (PEPCID) 20 MG tablet Take 20 mg by mouth daily.    Yes [provider]  FLUoxetine (PROZAC) 40 MG capsule TAKE 1 CAPSULE BY MOUTH EVERY DAY 06/30/21  Yes Billie Ruddy, MD  valACYclovir (VALTREX) 1000 MG tablet Take 1 tablet (1,000 mg total) by mouth daily. 12/10/20  Yes Billie Ruddy, MD    Physical Exam: Vitals:   07/09/21 1445 07/09/21 1500 07/09/21 1636 07/09/21 1700  BP: (!) 88/62 91/66 134/63   Pulse: 100  (!) 122 (!) 122  Resp: (!) 27 20 14  (!) 23  Temp:   98.9 F (  37.2 C)   TempSrc:   Oral   SpO2: 96%  97% 100%  Weight:      Height:        Constitutional: NAD, calm, comfortable Eyes: PERRL, lids and conjunctivae normal ENMT: Mucous membranes are moist. Posterior pharynx clear of any exudate or lesions.Normal dentition.  Neck: normal, supple, no masses, no thyromegaly Respiratory: clear to auscultation bilaterally, no wheezing, no crackles. Normal respiratory effort. No accessory muscle use.  Cardiovascular: irregular rate and rhythm, no murmurs / rubs / gallops. No extremity edema. 2+ pedal pulses. No carotid bruits.  Abdomen: no tenderness, no masses palpated. No hepatosplenomegaly. Bowel sounds positive.  Musculoskeletal: no clubbing /  cyanosis. No joint deformity upper and lower extremities. Good ROM, no contractures. Normal muscle tone.  Skin: no rashes, lesions, ulcers. No induration Neurologic: CN 2-12 grossly intact. Sensation intact, DTR normal. Strength 5/5 in all 4.  Psychiatric: Normal judgment and insight. Alert and oriented x 3. Normal mood.    Labs on Admission: I have personally reviewed following labs and imaging studies  CBC: Recent Labs  Lab 07/09/21 1000  WBC 4.7  HGB 14.2  HCT 41.4  MCV 92.0  PLT 073*   Basic Metabolic Panel: Recent Labs  Lab 07/09/21 1000  NA 133*  K 3.6  CL 102  CO2 16*  GLUCOSE 181*  BUN 18  CREATININE 1.17*  CALCIUM 9.3   GFR: Estimated Creatinine Clearance: 44.9 mL/min (A) (by C-G formula based on SCr of 1.17 mg/dL (H)). Liver Function Tests: Recent Labs  Lab 07/09/21 1000  AST 22  ALT 19  ALKPHOS 86  BILITOT 2.5*  PROT 7.2  ALBUMIN 3.9   Recent Labs  Lab 07/09/21 1000  LIPASE <10*   No results for input(s): AMMONIA in the last 168 hours. Coagulation Profile: No results for input(s): INR, PROTIME in the last 168 hours. Cardiac Enzymes: No results for input(s): CKTOTAL, CKMB, CKMBINDEX, TROPONINI in the last 168 hours. BNP (last 3 results) No results for input(s): PROBNP in the last 8760 hours. HbA1C: No results for input(s): HGBA1C in the last 72 hours. CBG: No results for input(s): GLUCAP in the last 168 hours. Lipid Profile: No results for input(s): CHOL, HDL, LDLCALC, TRIG, CHOLHDL, LDLDIRECT in the last 72 hours. Thyroid Function Tests: No results for input(s): TSH, T4TOTAL, FREET4, T3FREE, THYROIDAB in the last 72 hours. Anemia Panel: No results for input(s): VITAMINB12, FOLATE, FERRITIN, TIBC, IRON, RETICCTPCT in the last 72 hours. Urine analysis:    Component Value Date/Time   COLORURINE YELLOW 07/09/2021 1340   APPEARANCEUR HAZY (A) 07/09/2021 1340   LABSPEC 1.020 07/09/2021 1340   PHURINE 5.5 07/09/2021 1340   GLUCOSEU 100 (A)  07/09/2021 1340   HGBUR LARGE (A) 07/09/2021 1340   BILIRUBINUR NEGATIVE 07/09/2021 1340   BILIRUBINUR neg 11/30/2019 1338   KETONESUR 15 (A) 07/09/2021 1340   PROTEINUR 30 (A) 07/09/2021 1340   UROBILINOGEN 0.2 11/30/2019 1338   NITRITE NEGATIVE 07/09/2021 1340   LEUKOCYTESUR LARGE (A) 07/09/2021 1340    Radiological Exams on Admission: CT Renal Stone Study  Result Date: 07/09/2021 CLINICAL DATA:  Right flank pain EXAM: CT ABDOMEN AND PELVIS WITHOUT CONTRAST TECHNIQUE: Multidetector CT imaging of the abdomen and pelvis was performed following the standard protocol without IV contrast. COMPARISON:  None. FINDINGS: Lower chest: No acute abnormality. Hepatobiliary: No focal liver abnormality is seen. No gallstones, gallbladder wall thickening, or biliary dilatation. Pancreas: Atrophic with no suspicious mass or ductal dilatation visualized. Spleen: Normal in  size without focal abnormality. Adrenals/Urinary Tract: Adrenal glands appear normal. 2 mm nonobstructing calculus in the upper pole the left kidney. 5 mm calculus in the lower pole right kidney which is near the renal pelvis with asymmetric distention of the inferior pole calices. No ureteral calculi or frank hydronephrosis otherwise. There is perinephric fat stranding around the inferior pole the right kidney extending inferiorly in the retroperitoneum. Urinary bladder appears within normal limits. Stomach/Bowel: Intraluminal fluid in the visualized distal esophagus. Small hiatal hernia. No bowel obstruction, free air or pneumatosis. Colonic diverticulosis. No bowel wall edema. Appendix is normal. Vascular/Lymphatic: No significant vascular findings are present. No enlarged abdominal or pelvic lymph nodes. Reproductive: Status post hysterectomy. No adnexal masses. Other: No ascites. Musculoskeletal: No suspicious bony lesions. IMPRESSION: 1. 5 mm calculus in the lower pole right kidney which appears to obstruct the lower pole calices. Associated  perinephric and retroperitoneal fat stranding around the lower right kidney and inferiorly. 2. Small left renal calculus. 3. Colonic diverticulosis. 4. Small hiatal hernia. Fluid in the visualized distal esophagus, correlate for reflux. Electronically Signed   By: Ofilia Neas M.D.   On: 07/09/2021 10:23    EKG: Independently reviewed. A fib RVR  Assessment/Plan Principal Problem:   Pyelonephritis Active Problems:   Sepsis ([place])   Renal calculus, right   Atrial fibrillation, chronic ([facility])     Sepsis secondary to pyelonephritis -Noted to be hypotensive, tachycardic and tachypneic on arrival. -Started on cefepime -Follow-up blood and urine cultures -Lactic acid elevated, but is now trending down with IV fluids -Hypotensive on admission, overall blood pressures appear to be improving with IV fluids  Right renal calculus -Seen by urology, it was not felt to be a candidate for stent placement. -Subsequently seen by interventional radiology and it was felt that it may also be very difficult to decompress the lower pole calyces with a nephrostomy due to mild distention of the calyces and the obstructing stone -Plans are to keep the patient n.p.o. for now, continue to hold Eliquis and continue medical management with IV fluids and antibiotics for now  Atrial fibrillation with rapid ventricular response -Likely precipitated by sepsis -Chronically on Coreg, held on admission due to hypotension -Since blood pressure started to trend up, can consider low-dose IV Lopressor -Resume home dose of Coreg once blood pressures have stabilized -Apixaban currently on hold due to need for possible procedure.  Last dose appears to be on 1/4 -CHADS2VASc of 2.   DVT prophylaxis: scd Code Status: full code  Family Communication: discussed with husband at the bedside. Left VM for daughter  Disposition Plan: discharge home once improved  Consults called: urology, Interventional radiology  Admission  status: inpatient, stepdown   Kathie Dike MD [facility]   If 7PM-7AM, please contact night-coverage [email or web address]   07/09/2021, 7:31 PM

## 2021-07-09 NOTE — Sepsis Progress Note (Signed)
Notified bedside nurse of need to draw repeat lactic acid after completion of fluid boluses.

## 2021-07-09 NOTE — ED Provider Notes (Addendum)
[facility] EMERGENCY DEPT Provider Note   CSN: [id number] Arrival date & time: 07/09/21  4034     History  Chief Complaint  Patient presents with   Abdominal Pain   Flank Pain    Colleen Reynolds is a 76 y.o. female.  HPI Patient has past medical history significant for atrial fibrillation anticoagulated on Eliquis and kidney stones.  Patient reports for several months she has been having urine seemed frothy urine malodorous than normal.  She reports she did have some UTI symptoms a couple weeks ago but they seem to improve.  She had mentioned it to her doctor but did not get a diagnosis of UTI and has not been treated with antibiotics.  Patient reports yesterday during the day she started to get some pretty intense feeling of pain and fullness in her right flank and lower abdomen.  She reports by nighttime the pain got extremely intense like something was twisting.  She reports at that point she started having multiple episodes of vomiting and some diarrhea.  She reports she vomited multiple times and now has nothing more to bring up.  She reports she does feel very dehydrated at this point.  She reports she feels lightheaded.  She reports the pain in her right abdomen seem to move a little bit and then she became highly suspicious of a kidney stone.    Home Medications Prior to Admission medications   Medication Sig Start Date End Date Taking? Authorizing Provider  apixaban (ELIQUIS) 5 MG TABS tablet Take 1 tablet (5 mg total) by mouth 2 (two) times daily. 05/14/21  Yes Billie Ruddy, MD  carvedilol (COREG) 3.125 MG tablet TAKE 1 TABLET BY MOUTH 2 TIMES DAILY WITH A MEAL. 06/15/21  Yes Billie Ruddy, MD  famotidine (PEPCID) 20 MG tablet Take 20 mg by mouth daily.    Yes [provider]  FLUoxetine (PROZAC) 40 MG capsule TAKE 1 CAPSULE BY MOUTH EVERY DAY 06/30/21  Yes Billie Ruddy, MD  valACYclovir (VALTREX) 1000 MG tablet Take 1 tablet (1,000 mg  total) by mouth daily. 12/10/20  Yes Billie Ruddy, MD      Allergies    Clindamycin/lincomycin, Lovastatin, and Penicillins    Review of Systems   Review of Systems 10 systems reviewed and negative except as per HPI Physical Exam Updated Vital Signs BP (!) 88/62    Pulse 100    Temp 97.8 F (36.6 C)    Resp (!) 27    Ht 5\' 6"  (1.676 m)    Wt 77.1 kg    SpO2 96%    BMI 27.44 kg/m  Physical Exam Constitutional:      Comments: Alert with clear mental status.  No respiratory distress at rest.  Mildly ill and moderately uncomfortable in appearance  HENT:     Head: Normocephalic and atraumatic.     Mouth/Throat:     Mouth: Mucous membranes are dry.     Pharynx: Oropharynx is clear.  Eyes:     Extraocular Movements: Extraocular movements intact.  Cardiovascular:     Comments: Tachycardia irregularly irregular. Pulmonary:     Comments: No respiratory distress.  Lungs are grossly clear Abdominal:     Comments: Mild to moderate right lower abdomen pain without guarding.  Musculoskeletal:        General: No swelling or tenderness. Normal range of motion.     Right lower leg: No edema.     Left lower leg: No edema.  Skin:    General: Skin is warm and dry.  Neurological:     General: No focal deficit present.     Mental Status: She is oriented to person, place, and time.     Coordination: Coordination normal.  Psychiatric:        Mood and Affect: Mood normal.    ED Results / Procedures / Treatments   Labs (all labs ordered are listed, but only abnormal results are displayed) Labs Reviewed  LIPASE, BLOOD - Abnormal; Notable for the following components:      Result Value   Lipase <10 (*)    All other components within normal limits  COMPREHENSIVE METABOLIC PANEL - Abnormal; Notable for the following components:   Sodium 133 (*)    CO2 16 (*)    Glucose, Bld 181 (*)    Creatinine, Ser 1.17 (*)    Total Bilirubin 2.5 (*)    GFR, Estimated 49 (*)    All other components  within normal limits  CBC - Abnormal; Notable for the following components:   Platelets 149 (*)    All other components within normal limits  URINALYSIS, ROUTINE W REFLEX MICROSCOPIC - Abnormal; Notable for the following components:   APPearance HAZY (*)    Glucose, UA 100 (*)    Hgb urine dipstick LARGE (*)    Ketones, ur 15 (*)    Protein, ur 30 (*)    Leukocytes,Ua LARGE (*)    WBC, UA >50 (*)    Bacteria, UA MANY (*)    All other components within normal limits  LACTIC ACID, PLASMA - Abnormal; Notable for the following components:   Lactic Acid, Venous 3.4 (*)    All other components within normal limits  LACTIC ACID, PLASMA - Abnormal; Notable for the following components:   Lactic Acid, Venous 2.7 (*)    All other components within normal limits  URINE CULTURE  CULTURE, BLOOD (ROUTINE X 2)  CULTURE, BLOOD (ROUTINE X 2)  URINE CULTURE  RESP PANEL BY RT-PCR (FLU A&B, COVID) [id number]    EKG EKG Interpretation  Date/Time:  Thursday July 09 2021 11:20:46 EST Ventricular Rate:  163 PR Interval:    QRS Duration: 80 QT Interval:  313 QTC Calculation: 516 R Axis:   -57 Text Interpretation: Atrial fibrillation with rapid V-rate Left anterior fascicular block Abnormal R-wave progression, late transition Left ventricular hypertrophy Borderline T abnormalities, inferior leads agree, no afib on previous tracing but patient with h/o afib Confirmed by Charlesetta Shanks [phone]) on 07/09/2021 2:53:03 PM  Radiology CT Renal Stone Study  Result Date: 07/09/2021 CLINICAL DATA:  Right flank pain EXAM: CT ABDOMEN AND PELVIS WITHOUT CONTRAST TECHNIQUE: Multidetector CT imaging of the abdomen and pelvis was performed following the standard protocol without IV contrast. COMPARISON:  None. FINDINGS: Lower chest: No acute abnormality. Hepatobiliary: No focal liver abnormality is seen. No gallstones, gallbladder wall thickening, or biliary dilatation. Pancreas: Atrophic with no suspicious mass or  ductal dilatation visualized. Spleen: Normal in size without focal abnormality. Adrenals/Urinary Tract: Adrenal glands appear normal. 2 mm nonobstructing calculus in the upper pole the left kidney. 5 mm calculus in the lower pole right kidney which is near the renal pelvis with asymmetric distention of the inferior pole calices. No ureteral calculi or frank hydronephrosis otherwise. There is perinephric fat stranding around the inferior pole the right kidney extending inferiorly in the retroperitoneum. Urinary bladder appears within normal limits. Stomach/Bowel: Intraluminal fluid in the visualized distal esophagus. Small hiatal hernia. No  bowel obstruction, free air or pneumatosis. Colonic diverticulosis. No bowel wall edema. Appendix is normal. Vascular/Lymphatic: No significant vascular findings are present. No enlarged abdominal or pelvic lymph nodes. Reproductive: Status post hysterectomy. No adnexal masses. Other: No ascites. Musculoskeletal: No suspicious bony lesions. IMPRESSION: 1. 5 mm calculus in the lower pole right kidney which appears to obstruct the lower pole calices. Associated perinephric and retroperitoneal fat stranding around the lower right kidney and inferiorly. 2. Small left renal calculus. 3. Colonic diverticulosis. 4. Small hiatal hernia. Fluid in the visualized distal esophagus, correlate for reflux. Electronically Signed   By: Ofilia Neas M.D.   On: 07/09/2021 10:23    Procedures Procedures  CRITICAL CARE Performed by: Charlesetta Shanks   Total critical care time: 45 minutes  Critical care time was exclusive of separately billable procedures and treating other patients.  Critical care was necessary to treat or prevent imminent or life-threatening deterioration.  Critical care was time spent personally by me on the following activities: development of treatment plan with patient and/or surrogate as well as nursing, discussions with consultants, evaluation of patient's  response to treatment, examination of patient, obtaining history from patient or surrogate, ordering and performing treatments and interventions, ordering and review of laboratory studies, ordering and review of radiographic studies, pulse oximetry and re-evaluation of patient's condition.   Medications Ordered in ED Medications  fentaNYL (SUBLIMAZE) injection 50 mcg (50 mcg Intravenous Given 07/09/21 1001)  lactated ringers infusion (has no administration in time range)  ceFEPIme (MAXIPIME) 2 g in sodium chloride 0.9 % 100 mL IVPB (has no administration in time range)  lactated ringers bolus 1,000 mL (has no administration in time range)  lactated ringers bolus 1,000 mL (1,000 mLs Intravenous New Bag/Given 07/09/21 1308)    And  lactated ringers bolus 1,000 mL (0 mLs Intravenous Stopped 07/09/21 1339)    And  lactated ringers bolus 500 mL (500 mLs Intravenous New Bag/Given 07/09/21 1401)  ceFEPIme (MAXIPIME) 2 g in sodium chloride 0.9 % 100 mL IVPB (0 g Intravenous Stopped 07/09/21 1230)  sodium chloride 0.9 % bolus 1,000 mL (0 mLs Intravenous Stopped 07/09/21 1306)    ED Course/ Medical Decision Making/ A&P Clinical Course as of 07/09/21 1453  Thu Jul 09, 2021  1302 Recheck: Heart rate is improved.  Now down to 107.  Systolic blood pressures up at 98.  Patient's mental status is clear.  No respiratory distress. [MP]  1432 Consult: Reviewed with Dr. Rexene Alberts urology.  Request patient have ED to ED transfer to [facility] to arrange for urostomy tube. [MP]    Clinical Course User Index [MP] Charlesetta Shanks, MD                           Medical Decision Making Patient presents with symptoms onset of severe right flank and lower abdomen pain.  Patient describes clinical symptoms of UTI over the past several months.  Patient is significantly tachycardic with atrial fibrillation ranging from 120s up to 160s.  She describes rigors overnight with multiple episodes of vomiting.  CT scan has already returned  with stranding and a retained stone.  At this time will initiate sepsis protocol with suspected urinary source.  Patient has not been on to produce a urine specimen yet but clinical symptoms are very consistent.  Patient has known atrial fibrillation and is anticoagulated.  Her rates are vacillating with a fairly wide range.  I suspect atrial fib with episodes of  flutter.  At this time patient's mental status is clear, she has no respiratory distress.  She has had several soft blood pressures.  Plan at this time will be to pursue aggressive hydration at 30 cc/kg and see if rates are improved with volume replacement.  If tolerated, may need to consider rate control medications.  Patient has made significant improvement with fluid resuscitation.  Heart rate has come down to low 100s.  Systolic blood pressure has remained greater than 90.  Mental status remains clear.  Confirmed UTI with retained kidney stone.  Patient presented septic with A. fib rapid ventricular response.  Patient does have history of A. fib and is anticoagulated.  Rates improved significantly with resuscitation.  Patient has elevated lactic acid which is showing some improvement with resuscitation.  Consultation is made with urology.  Patient needs expeditious transfer to [facility] emergency department for evaluation for percutaneous drainage of the involved kidney.  Consult: Reviewed with Dr. Zenia Resides for ED to ED transfer.  14:55 Awaiting callback consult from hospitalist service at [facility] to complete position planning. 15: 05 reviewed with Dr. Verlon Au for hospitalist admission.  He requests he be updated when patient is at [facility].  Critical care will need to be consulted if patient has instability with blood pressure or clinical worsening pre or post perc drain placement, indicating ICU necessity. Final Clinical Impression(s) / ED Diagnoses Final diagnoses:  Sepsis, due to unspecified organism, unspecified whether acute  organ dysfunction present [facility])  Kidney stone  Acute pyelonephritis    Rx / DC Orders ED Discharge Orders     None         Charlesetta Shanks, MD 07/09/21 1434    Charlesetta Shanks, MD 07/09/21 1455    Charlesetta Shanks, MD 07/09/21 1507

## 2021-07-09 NOTE — ED Notes (Signed)
Per verbal order 1 Lt of NS

## 2021-07-09 NOTE — ED Notes (Signed)
Strained urine, appeared to be very scant amount of extremely small small grandular particles smaller than grain of sand

## 2021-07-09 NOTE — Consult Note (Addendum)
Chief Complaint: Patient was seen in consultation today for right nephrolithiasis  Referring Physician(s): Rexene Alberts, MD  Patient Status: [facility] - In-pt  History of Present Illness: Colleen Reynolds is a 76 y.o. female with right flank pain, urinary tract symptoms and evidence for small obstructive stone in the right kidney.  Past medical history significant for atrial fibrillation and on Eliquis.  Last dose of Eliquis was on Wednesday afternoon (07/08/21).  Patient was admitted with sepsis and urinary tract infection and has been evaluated by Dr. Abner Greenspan in urology.  Patient is currently resting comfortably and feels much better than she did earlier in the day.  History of urolithiasis and previous stent placement.  Past Medical History:  Diagnosis Date   Depression    Diverticulosis    Kidney stones 2008   OA (osteoarthritis) of hip    Osteopenia    Shingles    Vitamin D deficiency     Past Surgical History:  Procedure Laterality Date   ABDOMINAL HYSTERECTOMY     BREAST REDUCTION SURGERY  1985   COLONOSCOPY  2005   FRACTURE SURGERY Left 2020   dislocated left elbow    LITHOTRIPSY  2008    Allergies: Clindamycin/lincomycin, Lovastatin, and Penicillins  Medications: Prior to Admission medications   Medication Sig Start Date End Date Taking? Authorizing Provider  apixaban (ELIQUIS) 5 MG TABS tablet Take 1 tablet (5 mg total) by mouth 2 (two) times daily. 05/14/21  Yes Billie Ruddy, MD  carvedilol (COREG) 3.125 MG tablet TAKE 1 TABLET BY MOUTH 2 TIMES DAILY WITH A MEAL. 06/15/21  Yes Billie Ruddy, MD  famotidine (PEPCID) 20 MG tablet Take 20 mg by mouth daily.    Yes [provider]  FLUoxetine (PROZAC) 40 MG capsule TAKE 1 CAPSULE BY MOUTH EVERY DAY 06/30/21  Yes Billie Ruddy, MD  valACYclovir (VALTREX) 1000 MG tablet Take 1 tablet (1,000 mg total) by mouth daily. 12/10/20  Yes Billie Ruddy, MD     Family History  Problem Relation Age of Onset    Colon cancer Neg Hx    Esophageal cancer Neg Hx    Rectal cancer Neg Hx    Stomach cancer Neg Hx     Social History   Socioeconomic History   Marital status: Married    Spouse name: Not on file   Number of children: Not on file   Years of education: Not on file   Highest education level: Not on file  Occupational History   Not on file  Tobacco Use   Smoking status: Never   Smokeless tobacco: Never  Vaping Use   Vaping Use: Never used  Substance and Sexual Activity   Alcohol use: Yes    Comment: socially   Drug use: No   Sexual activity: Not on file  Other Topics Concern   Not on file  Social History Narrative   Not on file   Social Determinants of Health   Financial Resource Strain: Low Risk    Difficulty of Paying Living Expenses: Not hard at all  Food Insecurity: No Food Insecurity   Worried About Estate manager/land agent of Food in the Last Year: Never true   Ran Out of Food in the Last Year: Never true  Transportation Needs: No Transportation Needs   Lack of Transportation (Medical): No   Lack of Transportation (Non-Medical): No  Physical Activity: Inactive   Days of Exercise per Week: 0 days   Minutes of Exercise per Session:  0 min  Stress: No Stress Concern Present   Feeling of Stress : Not at all  Social Connections: Moderately Isolated   Frequency of Communication with Friends and Family: More than three times a week   Frequency of Social Gatherings with Friends and Family: Once a week   Attends Religious Services: Never   Marine scientist or Organizations: No   Attends Archivist Meetings: Never   Marital Status: Married     Review of Systems  Genitourinary:  Positive for flank pain.   Vital Signs: BP 134/63 (BP Location: Right Arm)    Pulse (!) 122    Temp 98.9 F (37.2 C) (Oral)    Resp (!) 23    Ht 5\' 6"  (1.676 m)    Wt 77.1 kg    SpO2 100%    BMI 27.44 kg/m   Physical Exam Constitutional:      General: She is not in acute  distress. Neurological:     Mental Status: She is alert.    Imaging: CT Renal Stone Study  Result Date: 07/09/2021 CLINICAL DATA:  Right flank pain EXAM: CT ABDOMEN AND PELVIS WITHOUT CONTRAST TECHNIQUE: Multidetector CT imaging of the abdomen and pelvis was performed following the standard protocol without IV contrast. COMPARISON:  None. FINDINGS: Lower chest: No acute abnormality. Hepatobiliary: No focal liver abnormality is seen. No gallstones, gallbladder wall thickening, or biliary dilatation. Pancreas: Atrophic with no suspicious mass or ductal dilatation visualized. Spleen: Normal in size without focal abnormality. Adrenals/Urinary Tract: Adrenal glands appear normal. 2 mm nonobstructing calculus in the upper pole the left kidney. 5 mm calculus in the lower pole right kidney which is near the renal pelvis with asymmetric distention of the inferior pole calices. No ureteral calculi or frank hydronephrosis otherwise. There is perinephric fat stranding around the inferior pole the right kidney extending inferiorly in the retroperitoneum. Urinary bladder appears within normal limits. Stomach/Bowel: Intraluminal fluid in the visualized distal esophagus. Small hiatal hernia. No bowel obstruction, free air or pneumatosis. Colonic diverticulosis. No bowel wall edema. Appendix is normal. Vascular/Lymphatic: No significant vascular findings are present. No enlarged abdominal or pelvic lymph nodes. Reproductive: Status post hysterectomy. No adnexal masses. Other: No ascites. Musculoskeletal: No suspicious bony lesions. IMPRESSION: 1. 5 mm calculus in the lower pole right kidney which appears to obstruct the lower pole calices. Associated perinephric and retroperitoneal fat stranding around the lower right kidney and inferiorly. 2. Small left renal calculus. 3. Colonic diverticulosis. 4. Small hiatal hernia. Fluid in the visualized distal esophagus, correlate for reflux. Electronically Signed   By: Ofilia Neas M.D.   On: 07/09/2021 10:23    Labs:  CBC: Recent Labs    04/22/21 0954 07/09/21 1000  WBC 5.7 4.7  HGB 15.2* 14.2  HCT 45.2 41.4  PLT 274.0 149*    COAGS: No results for input(s): INR, APTT in the last 8760 hours.  BMP: Recent Labs    04/22/21 0954 07/09/21 1000  NA 138 133*  K 4.2 3.6  CL 105 102  CO2 24 16*  GLUCOSE 102* 181*  BUN 15 18  CALCIUM 10.0 9.3  CREATININE 0.99 1.17*  GFRNONAA  --  49*    LIVER FUNCTION TESTS: Recent Labs    04/22/21 0954 07/09/21 1000  BILITOT 1.0 2.5*  AST 17 22  ALT 15 19  ALKPHOS 85 86  PROT 7.3 7.2  ALBUMIN 4.2 3.9    TUMOR MARKERS: No results for input(s): AFPTM, CEA, CA199,  [place] in the last 8760 hours.  Assessment and Plan:  76 year old with right flank pain and a small 5 mm stone obstructing the right kidney lower pole calyces.  Patient is being managed with broad-spectrum antibiotics and already feels better than she did earlier in the day.  I discussed the case with Dr. Abner Greenspan in Urology, who does not feel that it would be feasible to place a retrograde ureter stent into the obstructed lower pole calyx.  We discussed possible percutaneous nephrostomy tube placement.  It may also be very difficult to decompress the lower pole calyces with a nephrostomy due to the mild distention of the calyces and the obstructing stone.  In addition, the patient is anticoagulated on Eliquis which increases bleeding risks for a percutaneous nephrostomy.  Plan is to keep patient NPO, hold Eliquis and continue with medical management for now.  IR will continue to follow.    Thank you for this interesting consult.  I greatly enjoyed meeting Mayrani A Dewey and look forward to participating in their care.    Electronically Signed: Burman Riis, MD 07/09/2021, 6:55 PM

## 2021-07-09 NOTE — Consult Note (Signed)
Urology Consult Note   Requesting Attending Physician:  Kathie Dike, MD Service Providing Consult: Urology   Reason for Consult:  Nephrolithiasis  HPI: Colleen Reynolds is seen in consultation for reasons noted above at the request of Kathie Dike, MD  for evaluation of urosepsis.  Patient had UTI symptoms for past week. Developed flank pain on the right side and nausea over past day or so. Presented to ED and found to be in A fib with RVR. Lactate >3. Hypotensive. Afebrile. She reported chills and rigors at home and in the ED which has resolved.  She notes that she has had symptoms suspicious for UTI a few weeks ago including malodor and frothy urine. Symptoms worsened over past 24 hours including right flank pain, and vomiting. Continues to feel nauseous and lightheaded.   She does have a history of urolithiasis and underwent ureteroscopy with stents in the past which she poorly tolerated and does not want a stent.   Past Medical History: Past Medical History:  Diagnosis Date   Depression    Diverticulosis    Kidney stones 2008   OA (osteoarthritis) of hip    Osteopenia    Shingles    Vitamin D deficiency     Past Surgical History:  Past Surgical History:  Procedure Laterality Date   ABDOMINAL HYSTERECTOMY     BREAST REDUCTION SURGERY  1985   COLONOSCOPY  2005   FRACTURE SURGERY Left 2020   dislocated left elbow    LITHOTRIPSY  2008    Medication: Current Facility-Administered Medications  Medication Dose Route Frequency Provider Last Rate Last Admin   ceFEPIme (MAXIPIME) 2 g in sodium chloride 0.9 % 100 mL IVPB  2 g Intravenous Q12H Rumbarger, Rachel L, RPH       lactated ringers bolus 1,000 mL  1,000 mL Intravenous Once Charlesetta Shanks, MD       lactated ringers infusion   Intravenous Continuous Charlesetta Shanks, MD 150 mL/hr at 07/09/21 1639 New Bag at 07/09/21 1639    Allergies: Allergies  Allergen Reactions   Clindamycin/Lincomycin Nausea And Vomiting    Lovastatin Other (See Comments)    Fatigue, muscle aches   Penicillins     Social History: Social History   Tobacco Use   Smoking status: Never   Smokeless tobacco: Never  Vaping Use   Vaping Use: Never used  Substance Use Topics   Alcohol use: Yes    Comment: socially   Drug use: No    Family History Family History  Problem Relation Age of Onset   Colon cancer Neg Hx    Esophageal cancer Neg Hx    Rectal cancer Neg Hx    Stomach cancer Neg Hx     Review of Systems 10 systems were reviewed and are negative except as noted specifically in the HPI.  Objective   Vital signs in last 24 hours: BP 91/66    Pulse 100    Temp 97.8 F (36.6 C)    Resp 20    Ht 5\' 6"  (1.676 m)    Wt 77.1 kg    SpO2 96%    BMI 27.44 kg/m   Physical Exam General: NAD, A&O, resting, appropriate HEENT: [place]/AT, EOMI, MMM Pulmonary: Normal work of breathing Cardiovascular: HDS, adequate peripheral perfusion Abdomen: Soft, NTTP, nondistended GU: No CVA tenderness Extremities: warm and well perfused Neuro: Appropriate, no focal neurological deficits  Most Recent Labs: Lab Results  Component Value Date   WBC 4.7 07/09/2021   HGB  14.2 07/09/2021   HCT 41.4 07/09/2021   PLT 149 (L) 07/09/2021    Lab Results  Component Value Date   NA 133 (L) 07/09/2021   K 3.6 07/09/2021   CL 102 07/09/2021   CO2 16 (L) 07/09/2021   BUN 18 07/09/2021   CREATININE 1.17 (H) 07/09/2021   CALCIUM 9.3 07/09/2021    No results found for: INR, APTT   Urine Culture: @[id number] (laburin,org,r9620,[id number])@   IMAGING: CT Renal Stone Study  Result Date: 07/09/2021 CLINICAL DATA:  Right flank pain EXAM: CT ABDOMEN AND PELVIS WITHOUT CONTRAST TECHNIQUE: Multidetector CT imaging of the abdomen and pelvis was performed following the standard protocol without IV contrast. COMPARISON:  None. FINDINGS: Lower chest: No acute abnormality. Hepatobiliary: No focal liver abnormality is seen. No gallstones, gallbladder  wall thickening, or biliary dilatation. Pancreas: Atrophic with no suspicious mass or ductal dilatation visualized. Spleen: Normal in size without focal abnormality. Adrenals/Urinary Tract: Adrenal glands appear normal. 2 mm nonobstructing calculus in the upper pole the left kidney. 5 mm calculus in the lower pole right kidney which is near the renal pelvis with asymmetric distention of the inferior pole calices. No ureteral calculi or frank hydronephrosis otherwise. There is perinephric fat stranding around the inferior pole the right kidney extending inferiorly in the retroperitoneum. Urinary bladder appears within normal limits. Stomach/Bowel: Intraluminal fluid in the visualized distal esophagus. Small hiatal hernia. No bowel obstruction, free air or pneumatosis. Colonic diverticulosis. No bowel wall edema. Appendix is normal. Vascular/Lymphatic: No significant vascular findings are present. No enlarged abdominal or pelvic lymph nodes. Reproductive: Status post hysterectomy. No adnexal masses. Other: No ascites. Musculoskeletal: No suspicious bony lesions. IMPRESSION: 1. 5 mm calculus in the lower pole right kidney which appears to obstruct the lower pole calices. Associated perinephric and retroperitoneal fat stranding around the lower right kidney and inferiorly. 2. Small left renal calculus. 3. Colonic diverticulosis. 4. Small hiatal hernia. Fluid in the visualized distal esophagus, correlate for reflux. Electronically Signed   By: Ofilia Neas M.D.   On: 07/09/2021 10:23    ------  Assessment:  76 y.o. female with PMH A fib on Elliquis who presented with right flank pain, nausea and UT symptoms and CT that demonstrates 71mm obstructing stone with  associated hydronephrosis of obstructed right lower pole.   Concerning for infection given: Lactic acidosis, a fib with RVR, hypotension, UA concerning for infectin, stranding on exam In the ED received: Sepsis fluid bundle and Vanc and Zosyn.   We  discussed the indications for acute intervention including infected obstruction, bilateral ureteral obstruction or unilateral obstruction of solitary kidney as well as other less urgent indications for decompression which would included intractable pain, N/V, and acute renal injury. We also discussed the possible inability to place a ureteral stent in which case, the next intervention recommended would be a percutaneous nephrostomy tube. Given concern for sepsis is present and there is a high risk of failure of ureteral stent placement (obstructed lower pole calyx with acute turn) is present, we discussed IR consultation for urgent right percutaneous nephrostomy tube placement.  Recommendations: I discussed with Dr. Anselm Pancoast with IR regarding R PCN placement in her right lower pole. We reviewed images and although her right lower pole is hydronephrotic, the area is quite small and unlikely for curl to remain in place. Further, she has been anticoagulated with eliquis and last received 24 hours ago. As such, we agreed with initial medical management to include broad spectrum antibiotics and if she decompensates, will  plan for right PCN placement. Keep patient NPO and hold eliquis for now Continue broad antibiotics tailored per culture data Place Foley for maximal urinary decompression Continue to trend lactate and renal function.  We will arrange for outpatient eventual definitive stone management once episode of urosepsis has been treated.  Thank you for this consult. Please contact the urology consult pager with any further questions/concerns.

## 2021-07-09 NOTE — Sepsis Progress Note (Signed)
ELink tracking the Code Sepsis. 

## 2021-07-09 NOTE — ED Notes (Signed)
Report called to [facility] °

## 2021-07-09 NOTE — Progress Notes (Signed)
Pharmacy Antibiotic Note  Colleen Reynolds is a 76 y.o. female admitted on 07/09/2021 with sepsis and UTI.  Pharmacy has been consulted for cefepime dosing. Pt  is afebrile and WBC is WNL.   Plan: Cefepime 2gm IV Q12H F/u renal, C&S, clinical status  Height: 5\' 6"  (167.6 cm) Weight: 77.1 kg (170 lb) IBW/kg (Calculated) : 59.3  Temp (24hrs), Avg:97.8 F (36.6 C), Min:97.8 F (36.6 C), Max:97.8 F (36.6 C)  Recent Labs  Lab 07/09/21 1000  WBC 4.7  CREATININE 1.17*    Estimated Creatinine Clearance: 44.9 mL/min (A) (by C-G formula based on SCr of 1.17 mg/dL (H)).    Allergies  Allergen Reactions   Clindamycin/Lincomycin Nausea And Vomiting   Lovastatin Other (See Comments)    Fatigue, muscle aches   Penicillins     Antimicrobials this admission: Cefepime 1/5>>  Dose adjustments this admission: N/A  Microbiology results: Pending  Thank you for allowing pharmacy to be a part of this patients care.  Jahel Wavra, Rande Lawman 07/09/2021 11:26 AM

## 2021-07-09 NOTE — ED Triage Notes (Signed)
Pt arrives to ED with c/o flank pain & abdominal pain. This started yesterday. The pain is right flank and RLQ abd. Associated symptoms include nausea, vomiting, hematuria. Pain is described as sharp.

## 2021-07-10 ENCOUNTER — Inpatient Hospital Stay (HOSPITAL_COMMUNITY): Payer: Medicare HMO

## 2021-07-10 ENCOUNTER — Inpatient Hospital Stay: Payer: Medicare HMO | Admitting: Radiology

## 2021-07-10 ENCOUNTER — Telehealth: Payer: Self-pay | Admitting: Family Medicine

## 2021-07-10 DIAGNOSIS — M79604 Pain in right leg: Secondary | ICD-10-CM | POA: Diagnosis not present

## 2021-07-10 DIAGNOSIS — A419 Sepsis, unspecified organism: Secondary | ICD-10-CM

## 2021-07-10 DIAGNOSIS — R7881 Bacteremia: Secondary | ICD-10-CM

## 2021-07-10 HISTORY — PX: IR NEPHROSTOMY PLACEMENT RIGHT: IMG6064

## 2021-07-10 LAB — BLOOD CULTURE ID PANEL (REFLEXED) - BCID2

## 2021-07-10 LAB — URINE CULTURE: Culture: NO GROWTH

## 2021-07-10 LAB — COMPREHENSIVE METABOLIC PANEL
ALT: 22 U/L (ref 0–44)
AST: 27 U/L (ref 15–41)
Albumin: 2.8 g/dL — ABNORMAL LOW (ref 3.5–5.0)
Alkaline Phosphatase: 48 U/L (ref 38–126)
Anion gap: 8 (ref 5–15)
BUN: 18 mg/dL (ref 8–23)
CO2: 20 mmol/L — ABNORMAL LOW (ref 22–32)
Calcium: 8.8 mg/dL — ABNORMAL LOW (ref 8.9–10.3)
Chloride: 110 mmol/L (ref 98–111)
Creatinine, Ser: 0.85 mg/dL (ref 0.44–1.00)
GFR, Estimated: >60 mL/min (ref >60)
Glucose, Bld: 112 mg/dL — ABNORMAL HIGH (ref 70–99)
Potassium: 3.8 mmol/L (ref 3.5–5.1)
Sodium: 138 mmol/L (ref 135–145)
Total Bilirubin: 1.7 mg/dL — ABNORMAL HIGH (ref 0.3–1.2)
Total Protein: 5.9 g/dL — ABNORMAL LOW (ref 6.5–8.1)

## 2021-07-10 LAB — CBC
HCT: 35.3 % — ABNORMAL LOW (ref 36.0–46.0)
Hemoglobin: 11.8 g/dL — ABNORMAL LOW (ref 12.0–15.0)
MCH: 31.9 pg (ref 26.0–34.0)
MCHC: 33.4 g/dL (ref 30.0–36.0)
MCV: 95.4 fL (ref 80.0–100.0)
Platelets: 126 10*3/uL — ABNORMAL LOW (ref 150–400)
RBC: 3.7 MIL/uL — ABNORMAL LOW (ref 3.87–5.11)
RDW: 13.8 % (ref 11.5–15.5)
WBC: 12.4 10*3/uL — ABNORMAL HIGH (ref 4.0–10.5)
nRBC: 0 % (ref 0.0–0.2)

## 2021-07-10 LAB — PROTIME-INR
INR: 1.2 (ref 0.8–1.2)
Prothrombin Time: 14.7 seconds (ref 11.4–15.2)

## 2021-07-10 LAB — TSH: TSH: 1.09 u[IU]/mL (ref 0.350–4.500)

## 2021-07-10 LAB — LACTIC ACID, PLASMA: Lactic Acid, Venous: 0.8 mmol/L (ref 0.5–1.9)

## 2021-07-10 MED ORDER — VALACYCLOVIR HCL 500 MG PO TABS
1000.0000 mg | ORAL_TABLET | Freq: Every day | ORAL | Status: DC
  Administered 2021-07-10 – 2021-07-13 (×4): 1000 mg via ORAL
  Filled 2021-07-10 (×4): qty 2

## 2021-07-10 MED ORDER — CIPROFLOXACIN IN D5W 400 MG/200ML IV SOLN
INTRAVENOUS | Status: AC | PRN
  Administered 2021-07-10: 200 mg via INTRAVENOUS

## 2021-07-10 MED ORDER — FENTANYL CITRATE (PF) 100 MCG/2ML IJ SOLN
INTRAMUSCULAR | Status: AC | PRN
  Administered 2021-07-10: 25 ug via INTRAVENOUS

## 2021-07-10 MED ORDER — MEPERIDINE HCL 100 MG/ML IJ SOLN
INTRAMUSCULAR | Status: AC
  Filled 2021-07-10: qty 1

## 2021-07-10 MED ORDER — FENTANYL CITRATE (PF) 100 MCG/2ML IJ SOLN
INTRAMUSCULAR | Status: AC
  Filled 2021-07-10: qty 2

## 2021-07-10 MED ORDER — LIDOCAINE HCL 1 % IJ SOLN
INTRAMUSCULAR | Status: AC
  Filled 2021-07-10: qty 20

## 2021-07-10 MED ORDER — MEPERIDINE HCL 25 MG/ML IJ SOLN
INTRAMUSCULAR | Status: AC | PRN
  Administered 2021-07-10: 25 mg via INTRAVENOUS

## 2021-07-10 MED ORDER — SODIUM CHLORIDE 0.9 % IV SOLN
2.0000 g | Freq: Three times a day (TID) | INTRAVENOUS | Status: DC
  Administered 2021-07-10 – 2021-07-12 (×5): 2 g via INTRAVENOUS
  Filled 2021-07-10 (×5): qty 2

## 2021-07-10 MED ORDER — IOHEXOL 300 MG/ML  SOLN
50.0000 mL | Freq: Once | INTRAMUSCULAR | Status: AC | PRN
Start: 2021-07-10 — End: 2021-07-10
  Administered 2021-07-10: 50 mL

## 2021-07-10 MED ORDER — LIDOCAINE HCL (PF) 1 % IJ SOLN
INTRAMUSCULAR | Status: AC | PRN
  Administered 2021-07-10: 10 mL via INTRADERMAL

## 2021-07-10 MED ORDER — SODIUM CHLORIDE 0.9 % IV SOLN
8.0000 mg | Freq: Once | INTRAVENOUS | Status: AC
  Administered 2021-07-10: 8 mg via INTRAVENOUS
  Filled 2021-07-10: qty 4

## 2021-07-10 MED ORDER — FENTANYL CITRATE (PF) 100 MCG/2ML IJ SOLN
INTRAMUSCULAR | Status: AC | PRN
Start: 2021-07-10 — End: 2021-07-10
  Administered 2021-07-10: 25 ug via INTRAVENOUS

## 2021-07-10 MED ORDER — MIDAZOLAM HCL 2 MG/2ML IJ SOLN
INTRAMUSCULAR | Status: AC
  Filled 2021-07-10: qty 4

## 2021-07-10 MED ORDER — SODIUM CHLORIDE 0.9 % IV SOLN
2.0000 g | Freq: Every day | INTRAVENOUS | Status: DC
  Administered 2021-07-10: 2 g via INTRAVENOUS
  Filled 2021-07-10: qty 20

## 2021-07-10 MED ORDER — MIDAZOLAM HCL 2 MG/2ML IJ SOLN
INTRAMUSCULAR | Status: AC | PRN
  Administered 2021-07-10: .5 mg via INTRAVENOUS

## 2021-07-10 MED ORDER — CIPROFLOXACIN IN D5W 400 MG/200ML IV SOLN
INTRAVENOUS | Status: AC
  Filled 2021-07-10: qty 200

## 2021-07-10 MED ORDER — IOHEXOL 300 MG/ML  SOLN
50.0000 mL | Freq: Once | INTRAMUSCULAR | Status: AC | PRN
  Administered 2021-07-10: 30 mL

## 2021-07-10 MED ORDER — SODIUM CHLORIDE 0.9 % IV SOLN: INTRAVENOUS | Status: DC | PRN

## 2021-07-10 MED ORDER — MIDAZOLAM HCL 2 MG/2ML IJ SOLN
INTRAMUSCULAR | Status: AC | PRN
  Administered 2021-07-10: 1 mg via INTRAVENOUS

## 2021-07-10 MED ORDER — FENTANYL CITRATE (PF) 100 MCG/2ML IJ SOLN
INTRAMUSCULAR | Status: AC | PRN
  Administered 2021-07-10: 50 ug via INTRAVENOUS

## 2021-07-10 MED ORDER — MIDAZOLAM HCL 2 MG/2ML IJ SOLN
INTRAMUSCULAR | Status: AC | PRN
Start: 2021-07-10 — End: 2021-07-10
  Administered 2021-07-10: 1 mg via INTRAVENOUS

## 2021-07-10 MED ORDER — LACTATED RINGERS IV SOLN
INTRAVENOUS | Status: AC | PRN
  Administered 2021-07-10: 1000 mL via INTRAVENOUS

## 2021-07-10 NOTE — Telephone Encounter (Signed)
Pt was denied by her insurance company for the echo - can not be scheduled Denied P2P Status:    Approval Date:  Service Code: [phone] Service Description: ECHO Stress Test Complete Site Name: [facility] Start Date:  Expiration Date:  Date Last Updated: 05/09/2021 6:00:01 AM

## 2021-07-10 NOTE — Consult Note (Addendum)
Urology Consult Note   Requesting Attending Physician:  Kathie Dike, MD Service Providing Consult: Urology   Reason for Consult:  Nephrolithiasis  Interval 07/10/21: Blood pressures remain soft with MAPs in 60s. Patient is on IVF at 125 cc per hour. Tachycardic in 110-120 in room. Afebrile. WBC 12. Alert, oriented and pleasantly conversant. Creatinine 0.9   Past Medical History: Past Medical History:  Diagnosis Date   Depression    Diverticulosis    Kidney stones 2008   OA (osteoarthritis) of hip    Osteopenia    Shingles    Vitamin D deficiency     Past Surgical History:  Past Surgical History:  Procedure Laterality Date   ABDOMINAL HYSTERECTOMY     BREAST REDUCTION SURGERY  1985   COLONOSCOPY  2005   FRACTURE SURGERY Left 2020   dislocated left elbow    LITHOTRIPSY  2008    Medication: Current Facility-Administered Medications  Medication Dose Route Frequency Provider Last Rate Last Admin   acetaminophen (TYLENOL) tablet 650 mg  650 mg Oral Q6H PRN Kathie Dike, MD       Or   acetaminophen (TYLENOL) suppository 650 mg  650 mg Rectal Q6H PRN Kathie Dike, MD       cefTRIAXone (ROCEPHIN) 2 g in sodium chloride 0.9 % 100 mL IVPB  2 g Intravenous Daily Memon, Jolaine Artist, MD       Chlorhexidine Gluconate Cloth 2 % PADS 6 each  6 each Topical Q0600 Memon, Jolaine Artist, MD       famotidine (PEPCID) tablet 20 mg  20 mg Oral Daily Memon, Jolaine Artist, MD       FLUoxetine (PROZAC) capsule 40 mg  40 mg Oral Daily Memon, Jehanzeb, MD       lactated ringers bolus 1,000 mL  1,000 mL Intravenous Once Kathie Dike, MD       lactated ringers infusion   Intravenous Continuous Kathie Dike, MD 125 mL/hr at 07/10/21 0600 Infusion Verify at 07/10/21 0600   metoprolol tartrate (LOPRESSOR) injection 2.5 mg  2.5 mg Intravenous Q6H Memon, Jolaine Artist, MD   2.5 mg at 07/10/21 0131   morphine 2 MG/ML injection 2 mg  2 mg Intravenous Q2H PRN Kathie Dike, MD   2 mg at 07/09/21 2002    ondansetron (ZOFRAN) tablet 4 mg  4 mg Oral Q6H PRN Kathie Dike, MD       Or   ondansetron (ZOFRAN) injection 4 mg  4 mg Intravenous Q6H PRN Kathie Dike, MD   4 mg at 07/10/21 0130    Allergies: Allergies  Allergen Reactions   Clindamycin/Lincomycin Nausea And Vomiting   Lovastatin Other (See Comments)    Fatigue, muscle aches   Penicillins     Social History: Social History   Tobacco Use   Smoking status: Never   Smokeless tobacco: Never  Vaping Use   Vaping Use: Never used  Substance Use Topics   Alcohol use: Yes    Comment: socially   Drug use: No    Family History Family History  Problem Relation Age of Onset   Colon cancer Neg Hx    Esophageal cancer Neg Hx    Rectal cancer Neg Hx    Stomach cancer Neg Hx     Review of Systems 10 systems were reviewed and are negative except as noted specifically in the HPI.  Objective   Vital signs in last 24 hours: BP (!) 86/50    Pulse 99    Temp 98.2 F (36.8 C) (Oral)  Resp 20    Ht 5\' 6"  (1.676 m)    Wt 77.1 kg    SpO2 95%    BMI 27.44 kg/m   Physical Exam General: NAD, A&O, resting, appropriate HEENT: [place]/AT, EOMI, MMM Pulmonary: Normal work of breathing Cardiovascular: HDS, adequate peripheral perfusion Abdomen: Soft, NTTP, nondistended GU: Mild right CVA tenderness Extremities: warm and well perfused Neuro: Appropriate, no focal neurological deficits  Most Recent Labs: Lab Results  Component Value Date   WBC 12.4 (H) 07/10/2021   HGB 11.8 (L) 07/10/2021   HCT 35.3 (L) 07/10/2021   PLT 126 (L) 07/10/2021    Lab Results  Component Value Date   NA 138 07/10/2021   K 3.8 07/10/2021   CL 110 07/10/2021   CO2 20 (L) 07/10/2021   BUN 18 07/10/2021   CREATININE 0.85 07/10/2021   CALCIUM 8.8 (L) 07/10/2021    No results found for: INR, APTT   Urine Culture: @[id number] (laburin,org,r9620,[id number])@   IMAGING: CT Renal Stone Study  Result Date: 07/09/2021 CLINICAL DATA:  Right flank pain  EXAM: CT ABDOMEN AND PELVIS WITHOUT CONTRAST TECHNIQUE: Multidetector CT imaging of the abdomen and pelvis was performed following the standard protocol without IV contrast. COMPARISON:  None. FINDINGS: Lower chest: No acute abnormality. Hepatobiliary: No focal liver abnormality is seen. No gallstones, gallbladder wall thickening, or biliary dilatation. Pancreas: Atrophic with no suspicious mass or ductal dilatation visualized. Spleen: Normal in size without focal abnormality. Adrenals/Urinary Tract: Adrenal glands appear normal. 2 mm nonobstructing calculus in the upper pole the left kidney. 5 mm calculus in the lower pole right kidney which is near the renal pelvis with asymmetric distention of the inferior pole calices. No ureteral calculi or frank hydronephrosis otherwise. There is perinephric fat stranding around the inferior pole the right kidney extending inferiorly in the retroperitoneum. Urinary bladder appears within normal limits. Stomach/Bowel: Intraluminal fluid in the visualized distal esophagus. Small hiatal hernia. No bowel obstruction, free air or pneumatosis. Colonic diverticulosis. No bowel wall edema. Appendix is normal. Vascular/Lymphatic: No significant vascular findings are present. No enlarged abdominal or pelvic lymph nodes. Reproductive: Status post hysterectomy. No adnexal masses. Other: No ascites. Musculoskeletal: No suspicious bony lesions. IMPRESSION: 1. 5 mm calculus in the lower pole right kidney which appears to obstruct the lower pole calices. Associated perinephric and retroperitoneal fat stranding around the lower right kidney and inferiorly. 2. Small left renal calculus. 3. Colonic diverticulosis. 4. Small hiatal hernia. Fluid in the visualized distal esophagus, correlate for reflux. Electronically Signed   By: Ofilia Neas M.D.   On: 07/09/2021 10:23    ------  Assessment:  76 y.o. female with PMH A fib on Elliquis who presented with right flank pain, nausea and UT  symptoms and CT that demonstrates 64mm obstructing stone with  associated hydronephrosis of obstructed right lower pole.   Concerning for infection given: Lactic acidosis, a fib with RVR, hypotension, UA concerning for infectin, stranding on exam In the ED received: Sepsis fluid bundle and Vanc and Zosyn.   We discussed the indications for acute intervention including infected obstruction, bilateral ureteral obstruction or unilateral obstruction of solitary kidney as well as other less urgent indications for decompression which would included intractable pain, N/V, and acute renal injury. We also discussed the possible inability to place a ureteral stent in which case, the next intervention recommended would be a percutaneous nephrostomy tube. Given concern for sepsis is present and there is a high risk of failure of ureteral stent placement (obstructed lower pole  calyx with acute turn into the lower pole calyx) is present, we discussed IR consultation for urgent right percutaneous nephrostomy tube placement.  Recommendations: Patient remains hypotensive despite antibiotics and fluid resuscitation so far. Discussed with IR about placing a percutaneous nephrostomy tube for renal decompression. They will evaluate. Again I discussed with patient that given acute angulation of lower pole calyx, unlikely a retrograde stent would decompress the lower pole.  Keep patient NPO and hold eliquis for now Continue broad antibiotics tailored per culture data Place Foley for maximal urinary decompression Continue to trend lactate and renal function.  We will arrange for outpatient eventual definitive stone management once episode of urosepsis has been treated.  Thank you for this consult. Please contact the urology consult pager with any further questions/concerns.   Matt R. [facility]  Pager: [phone]

## 2021-07-10 NOTE — Progress Notes (Signed)
PROGRESS NOTE    Colleen Reynolds  QPY:[id number] DOB: 04/12/1946 DOA: 07/09/2021 PCP: Billie Ruddy, MD    Brief Narrative:  HPI: Colleen Reynolds is a 76 y.o. female with medical history significant of chronic atrial fibrillation on anticoagulation, previous history of kidney stones, who presents to the hospital with right flank pain.  Reports that symptoms started just over 24 hours ago.  Discomfort started in right flank as well as right abdomen.  She progressively became nauseous and had intractable vomiting.  She was unable to keep anything down.  She felt increasingly weak and lightheaded.  She also experienced chills.  She denied any chest pain or shortness of breath.  She has not had any diarrhea.   ED Course: She was evaluated the emergency room where she wasnoted to be hypotensive and tachycardic.  EKG showed rapid atrial fibrillation.  Lactic acid noted to be elevated.  Urinalysis indicated possible infection.  CT abdomen did show concern for perinephric stranding as well as renal calculus.  She was started on IV fluids per sepsis protocol.  She was started on IV antibiotics.  Case was discussed with urology.  She was admitted for further management.   Assessment & Plan:   Principal Problem:   Pyelonephritis Active Problems:   Sepsis ([place])   Renal calculus, right   Atrial fibrillation, chronic (HCC)   Sepsis secondary to pyelonephritis and gram neg bacteremia -Noted to be hypotensive, tachycardic and tachypneic on arrival. -Started on cefepime -Blood cultures positive for E coli -Lactic acid elevated, but normalized with IV fluids -Hypotensive on admission, overall blood pressures appear to be improving with IV fluids   Right renal calculus -Seen by urology, it was felt that considering location of stone, that retrograde stent placement would be very challenging -Subsequently seen by interventional radiology who attempted to place percutaneous nephrostomy, but was  unable to do so -Plan is to continue antibiotic management -urology will continue to follow in case further intervention is needed -keep npo after midnight in case procedure needed overnight or in AM   Atrial fibrillation with rapid ventricular response -Likely precipitated by sepsis -Chronically on Coreg, held on admission due to hypotension -Resume home dose of Coreg once blood pressures have stabilized -Apixaban currently on hold due to need for possible procedure.  Last dose appears to be on 1/4 -CHADS2VASc of 2.  -can consider starting IV heparin bridge in AM if hemoglobin and hemodynamics stable     DVT prophylaxis: SCDs Start: 07/09/21 1723  Code Status: full code Family Communication: updated daughter over the phone Disposition Plan: Status is: Inpatient  Remains inpatient appropriate because: continued management of sepsis and obstructing renal stone         Consultants:  Urology Interventional radiology  Procedures:  1/6 attempted percutaneous nephrostomy, unsuccessful  Antimicrobials:  Cefepime 1/5>    Subjective: Seen in room after procedure. She complained of recurrent pain in flank and nausea. She began to have rigors during procedure  Objective: Vitals:   07/10/21 1946 07/10/21 2000 07/10/21 2100 07/10/21 2200  BP: 124/79  (!) 114/45 113/62  Pulse: (!) 123 (!) 124 91 83  Resp: 17 (!) 24 (!) 24 17  Temp:      TempSrc:      SpO2: 96% 95% 96% 98%  Weight:      Height:        Intake/Output Summary (Last 24 hours) at 07/10/2021 2214 Last data filed at 07/10/2021 2100 Gross per 24 hour  Intake  3654.72 ml  Output --  Net 3654.72 ml   Filed Weights   07/09/21 0952  Weight: 77.1 kg    Examination:  General exam: Appears calm and comfortable  Respiratory system: Clear to auscultation. Respiratory effort normal. Cardiovascular system: S1 & S2 heard, irregular. No JVD, murmurs, rubs, gallops or clicks. No pedal edema. Gastrointestinal system:  Abdomen is nondistended, soft and nontender. No organomegaly or masses felt. Normal bowel sounds heard. Central nervous system: Alert and oriented. No focal neurological deficits. Extremities: Symmetric 5 x 5 power. Skin: No rashes, lesions or ulcers Psychiatry: Judgement and insight appear normal. Mood & affect appropriate.     Data Reviewed: I have personally reviewed following labs and imaging studies  CBC: Recent Labs  Lab 07/09/21 1000 07/10/21 0549  WBC 4.7 12.4*  HGB 14.2 11.8*  HCT 41.4 35.3*  MCV 92.0 95.4  PLT 149* 147*   Basic Metabolic Panel: Recent Labs  Lab 07/09/21 1000 07/10/21 0549  NA 133* 138  K 3.6 3.8  CL 102 110  CO2 16* 20*  GLUCOSE 181* 112*  BUN 18 18  CREATININE 1.17* 0.85  CALCIUM 9.3 8.8*   GFR: Estimated Creatinine Clearance: 61.8 mL/min (by C-G formula based on SCr of 0.85 mg/dL). Liver Function Tests: Recent Labs  Lab 07/09/21 1000 07/10/21 0549  AST 22 27  ALT 19 22  ALKPHOS 86 48  BILITOT 2.5* 1.7*  PROT 7.2 5.9*  ALBUMIN 3.9 2.8*   Recent Labs  Lab 07/09/21 1000  LIPASE <10*   No results for input(s): AMMONIA in the last 168 hours. Coagulation Profile: Recent Labs  Lab 07/10/21 1840  INR 1.2   Cardiac Enzymes: No results for input(s): CKTOTAL, CKMB, CKMBINDEX, TROPONINI in the last 168 hours. BNP (last 3 results) No results for input(s): PROBNP in the last 8760 hours. HbA1C: No results for input(s): HGBA1C in the last 72 hours. CBG: No results for input(s): GLUCAP in the last 168 hours. Lipid Profile: No results for input(s): CHOL, HDL, LDLCALC, TRIG, CHOLHDL, LDLDIRECT in the last 72 hours. Thyroid Function Tests: Recent Labs    07/10/21 0549  TSH 1.090   Anemia Panel: No results for input(s): VITAMINB12, FOLATE, FERRITIN, TIBC, IRON, RETICCTPCT in the last 72 hours. Sepsis Labs: Recent Labs  Lab 07/09/21 1146 07/09/21 1400 07/10/21 0549  LATICACIDVEN 3.4* 2.7* 0.8    Recent Results (from the  past 240 hour(s))  Urine Culture     Status: None   Collection Time: 07/09/21 11:43 AM   Specimen: Urine, Clean Catch  Result Value Ref Range Status   Specimen Description   Final    URINE, CLEAN CATCH Performed at [facility], [address] [place], [place] [address]    Special Requests   Final    NONE Performed at [facility], [address], [place], [place] [address]    Culture   Final    NO GROWTH Performed at [facility], [place] [address] [place], [place] [address]    Report Status 07/10/2021 FINAL  Final  Culture, blood (routine x 2)     Status: None (Preliminary result)   Collection Time: 07/09/21 11:46 AM   Specimen: Left Antecubital; Blood  Result Value Ref Range Status   Specimen Description   Final    LEFT ANTECUBITAL Performed at [facility], [address] [place], [place] [address]    Special Requests   Final    BOTTLES DRAWN AEROBIC AND ANAEROBIC Blood Culture  adequate volume Performed at [facility], [place], [place] [address]    Culture  Setup Time   Final    GRAM NEGATIVE RODS IN BOTH AEROBIC AND ANAEROBIC BOTTLES CRITICAL RESULT CALLED TO, READ BACK BY AND VERIFIED WITH: M BELL,PHARMD@[id number]  07/10/21 [place] Performed at [facility], [place] [address] [place], [place]    Culture GRAM NEGATIVE RODS  Final   Report Status PENDING  Incomplete  Culture, blood (routine x 2)     Status: None (Preliminary result)   Collection Time: 07/09/21 11:46 AM   Specimen: BLOOD RIGHT HAND  Result Value Ref Range Status   Specimen Description   Final    BLOOD RIGHT HAND Performed at [facility], [address] [place], [place]    Special Requests   Final    BOTTLES DRAWN AEROBIC AND ANAEROBIC Blood Culture results may not be optimal due to an excessive volume of blood received in culture bottles Performed at [facility], [address] [place], [place]    Culture  Setup Time   Final    GRAM NEGATIVE RODS IN BOTH AEROBIC AND ANAEROBIC BOTTLES CRITICAL VALUE NOTED.  VALUE IS CONSISTENT WITH PREVIOUSLY REPORTED AND CALLED VALUE.    Culture   Final    NO GROWTH < 24 HOURS Performed at [facility], [place] [address], [place], [place]    Report Status PENDING  Incomplete  Blood Culture ID Panel (Reflexed)     Status: Abnormal   Collection Time: 07/09/21 11:46 AM  Result Value Ref Range Status   Enterococcus faecalis NOT DETECTED NOT DETECTED Final   Enterococcus Faecium NOT DETECTED NOT DETECTED Final   Listeria monocytogenes NOT DETECTED NOT DETECTED Final   Staphylococcus species NOT DETECTED NOT DETECTED Final   Staphylococcus aureus (BCID) NOT DETECTED NOT DETECTED Final   Staphylococcus epidermidis NOT DETECTED NOT DETECTED Final   Staphylococcus lugdunensis NOT DETECTED NOT DETECTED Final   Streptococcus species NOT DETECTED NOT DETECTED Final   Streptococcus agalactiae NOT DETECTED NOT DETECTED Final   Streptococcus pneumoniae NOT DETECTED NOT DETECTED Final   Streptococcus pyogenes NOT DETECTED NOT DETECTED Final   A.calcoaceticus-baumannii NOT DETECTED NOT DETECTED Final   Bacteroides fragilis NOT DETECTED NOT DETECTED Final   Enterobacterales DETECTED (A) NOT DETECTED Final    Comment: Enterobacterales represent a large order of gram negative bacteria, not a single organism. CRITICAL RESULT CALLED TO, READ BACK BY AND VERIFIED WITH: M BELL,PHARMD@0715  07/10/21 [place]    Enterobacter cloacae complex NOT DETECTED NOT DETECTED Final   Escherichia coli DETECTED (A) NOT DETECTED Final    Comment: CRITICAL RESULT CALLED TO, READ BACK BY AND VERIFIED WITH: M BELL,PHARMD@0715  07/10/21 [place]    Klebsiella aerogenes NOT DETECTED NOT DETECTED Final   Klebsiella oxytoca NOT DETECTED NOT DETECTED Final   Klebsiella pneumoniae NOT DETECTED NOT DETECTED Final    Proteus species NOT DETECTED NOT DETECTED Final   Salmonella species NOT DETECTED NOT DETECTED Final   Serratia marcescens NOT DETECTED NOT DETECTED Final   Haemophilus influenzae NOT DETECTED NOT DETECTED Final   Neisseria meningitidis NOT DETECTED NOT DETECTED Final   Pseudomonas aeruginosa NOT DETECTED NOT DETECTED Final   Stenotrophomonas maltophilia NOT DETECTED NOT DETECTED Final   Candida albicans NOT DETECTED NOT DETECTED Final   Candida auris NOT DETECTED NOT DETECTED Final   Candida glabrata NOT DETECTED NOT DETECTED Final   Candida krusei NOT DETECTED NOT  DETECTED Final   Candida parapsilosis NOT DETECTED NOT DETECTED Final   Candida tropicalis NOT DETECTED NOT DETECTED Final   Cryptococcus neoformans/gattii NOT DETECTED NOT DETECTED Final   CTX-M ESBL NOT DETECTED NOT DETECTED Final   Carbapenem resistance IMP NOT DETECTED NOT DETECTED Final   Carbapenem resistance KPC NOT DETECTED NOT DETECTED Final   Carbapenem resistance NDM NOT DETECTED NOT DETECTED Final   Carbapenem resist OXA 48 LIKE NOT DETECTED NOT DETECTED Final   Carbapenem resistance VIM NOT DETECTED NOT DETECTED Final    Comment: Performed at [facility], [place] [address], [place], [place] [address]  Resp Panel by RT-PCR (Flu A&B, Covid) Nasopharyngeal Swab     Status: None   Collection Time: 07/09/21  3:20 PM   Specimen: Nasopharyngeal Swab; Nasopharyngeal(NP) swabs in vial transport medium  Result Value Ref Range Status   SARS Coronavirus 2 by RT PCR NEGATIVE NEGATIVE Final    Comment: (NOTE) SARS-CoV-2 target nucleic acids are NOT DETECTED.  The SARS-CoV-2 RNA is generally detectable in upper respiratory specimens during the acute phase of infection. The lowest concentration of SARS-CoV-2 viral copies this assay can detect is 138 copies/mL. A negative result does not preclude SARS-Cov-2 infection and should not be used as the sole basis for treatment or other patient management decisions. A  negative result may occur with  improper specimen collection/handling, submission of specimen other than nasopharyngeal swab, presence of viral mutation(s) within the areas targeted by this assay, and inadequate number of viral copies(<138 copies/mL). A negative result must be combined with clinical observations, patient history, and epidemiological information. The expected result is Negative.  Fact Sheet for Patients:  EntrepreneurPulse.com.au  Fact Sheet for Healthcare Providers:  IncredibleEmployment.be  This test is no t yet approved or cleared by the [place] FDA and  has been authorized for detection and/or diagnosis of SARS-CoV-2 by FDA under an Emergency Use Authorization (EUA). This EUA will remain  in effect (meaning this test can be used) for the duration of the COVID-19 declaration under Section 564(b)(1) of the Act, 21 U.S.C.section 360bbb-3(b)(1), unless the authorization is terminated  or revoked sooner.       Influenza A by PCR NEGATIVE NEGATIVE Final   Influenza B by PCR NEGATIVE NEGATIVE Final    Comment: (NOTE) The Xpert Xpress SARS-CoV-2/FLU/RSV plus assay is intended as an aid in the diagnosis of influenza from Nasopharyngeal swab specimens and should not be used as a sole basis for treatment. Nasal washings and aspirates are unacceptable for Xpert Xpress SARS-CoV-2/FLU/RSV testing.  Fact Sheet for Patients: EntrepreneurPulse.com.au  Fact Sheet for Healthcare Providers: IncredibleEmployment.be  This test is not yet approved or cleared by the [place] FDA and has been authorized for detection and/or diagnosis of SARS-CoV-2 by FDA under an Emergency Use Authorization (EUA). This EUA will remain in effect (meaning this test can be used) for the duration of the COVID-19 declaration under Section 564(b)(1) of the Act, 21 U.S.C. section 360bbb-3(b)(1), unless the authorization  is terminated or revoked.  Performed at [facility], [address], [place], [place] [address]   MRSA Next Gen by PCR, Nasal     Status: None   Collection Time: 07/09/21  5:34 PM   Specimen: Nasal Mucosa; Nasal Swab  Result Value Ref Range Status   MRSA by PCR Next Gen NOT DETECTED NOT DETECTED Final    Comment: (NOTE) The GeneXpert MRSA Assay (FDA approved for NASAL specimens only), is one component of a comprehensive  MRSA colonization surveillance program. It is not intended to diagnose MRSA infection nor to guide or monitor treatment for MRSA infections. Test performance is not FDA approved in patients less than 11 years old. Performed at [facility], [place] [address] [place], [place] [address]          Radiology Studies: CT Renal Stone Study  Result Date: 07/09/2021 CLINICAL DATA:  Right flank pain EXAM: CT ABDOMEN AND PELVIS WITHOUT CONTRAST TECHNIQUE: Multidetector CT imaging of the abdomen and pelvis was performed following the standard protocol without IV contrast. COMPARISON:  None. FINDINGS: Lower chest: No acute abnormality. Hepatobiliary: No focal liver abnormality is seen. No gallstones, gallbladder wall thickening, or biliary dilatation. Pancreas: Atrophic with no suspicious mass or ductal dilatation visualized. Spleen: Normal in size without focal abnormality. Adrenals/Urinary Tract: Adrenal glands appear normal. 2 mm nonobstructing calculus in the upper pole the left kidney. 5 mm calculus in the lower pole right kidney which is near the renal pelvis with asymmetric distention of the inferior pole calices. No ureteral calculi or frank hydronephrosis otherwise. There is perinephric fat stranding around the inferior pole the right kidney extending inferiorly in the retroperitoneum. Urinary bladder appears within normal limits. Stomach/Bowel: Intraluminal fluid in the visualized distal esophagus. Small hiatal hernia. No bowel  obstruction, free air or pneumatosis. Colonic diverticulosis. No bowel wall edema. Appendix is normal. Vascular/Lymphatic: No significant vascular findings are present. No enlarged abdominal or pelvic lymph nodes. Reproductive: Status post hysterectomy. No adnexal masses. Other: No ascites. Musculoskeletal: No suspicious bony lesions. IMPRESSION: 1. 5 mm calculus in the lower pole right kidney which appears to obstruct the lower pole calices. Associated perinephric and retroperitoneal fat stranding around the lower right kidney and inferiorly. 2. Small left renal calculus. 3. Colonic diverticulosis. 4. Small hiatal hernia. Fluid in the visualized distal esophagus, correlate for reflux. Electronically Signed   By: Ofilia Neas M.D.   On: 07/09/2021 10:23   IR NEPHROSTOMY PLACEMENT RIGHT  Result Date: 07/10/2021 INDICATION: 76 year old female presents to the hospital with urosepsis secondary 2 a stone in the lower pole collecting system infundibulum, with local pelvicaliectasis of the lower pole. She has been referred for attempt at PCN. EXAM: IMAGE GUIDED PERCUTANEOUS NEPHROSTOMY COMPARISON:  CT 07/09/2021 MEDICATIONS: Ciprofloxacin 400 mg IV; The antibiotic was administered in an appropriate time frame prior to skin puncture. ANESTHESIA/SEDATION: Moderate (conscious) sedation was employed during this procedure. A total of Versed 3.5 mg and Fentanyl 200 mcg was administered intravenously by the radiology nurse. Total intra-service moderate Sedation Time: 98 minutes. The patient's level of consciousness and vital signs were monitored continuously by radiology nursing throughout the procedure under my direct supervision. CONTRAST:  80 cc-administered into the collecting system(s) FLUOROSCOPY TIME:  Fluoroscopy Time: 41 minutes 24 seconds (599 mGy). COMPLICATIONS: SIR LEVEL B - Normal therapy, includes overnight admission for observation. Intravasation of contrast at the conclusion of the case, with  bacteremia/rigors PROCEDURE: Informed written consent was obtained from the patient after a thorough discussion of the procedural risks, benefits and alternatives. All questions were addressed. Maximal Sterile Barrier Technique was utilized including caps, mask, sterile gowns, sterile gloves, sterile drape, hand hygiene and skin antiseptic. A timeout was performed prior to the initiation of the procedure. Patient positioned prone position on the fluoroscopy table. Ultrasound survey of the right flank was performed with images stored and sent to PACs. The patient was then prepped and draped in the usual sterile fashion. 1% lidocaine was used to anesthetize the skin and  subcutaneous tissues for local anesthesia. An Envy/chiba needle was then used in attempt to access the mildly dilated posterior inferior calyx with ultrasound guidance. After 3 attempts, we elected to forego ultrasound. A second needle puncture was then performed, targeting the infundibulum stone with a second needle from an AP approach. 1% lidocaine was used for local anesthesia, and the needle was then used to puncture the infundibulum at the site of the stone. Contrast opacification was performed. The second needle was then used in attempt to access the opacified lower pole and most posterior calyx. On the initial needle puncture, the needle tip was within the lower pole posterior calyx, however, the initial angulation was not favorable for passing the wire. A second more inferior needle approach was then performed, for a favorable angle into the calyx. On this second needle puncture the wire was passed into the lower pole collecting system calyx, though would not traverse the region the impacted stone. The Envy tri-axial system was then advanced over the wire into the collecting system under fluoroscopy. The metal stiffener and inner dilator were removed, and contrast was injected. Contrast was clearly not within the collecting system/lower pole  calyx on this passage of the triaxial system. The 4 French sheath was removed. At this point, there was not a needle within the collecting system, and another puncture of the stone was attempted. From here forward, all of the contrast was injected demonstrated some element of venous intravasation. The same needle trajectory from a caudal right posterior oblique approach was performed into the collapsed collecting system. Multiple needle punctures were performed, with only partial opacification. Ultimately the end the triaxial system was again past, which was only partially within the collapse collecting system. Multiple attempts of passing a 018 wire was performed, ultimately not within the lower pole calyx. The patient began to experience rigors after approximately 1 hour and 30 minutes of working time, 41 minutes of fluoroscopy time. We aborted given that there was a clear venous communication with the disrupted lower pole calyx. 30 cc estimated blood loss. IMPRESSION: Status post image guided attempt at a percutaneous nephrostomy of the isolated obstruction of the right lower pole calyx secondary to impacted infundibular stone. Ultimately, we were unable to place the catheter in the small space. Signed, Dulcy Fanny. Dellia Nims, RPVI [facility] Radiology Electronically Signed   By: Corrie Mckusick D.O.   On: 07/10/2021 18:03   VAS [place] LOWER EXTREMITY VENOUS (DVT)  Result Date: 07/10/2021  Lower Venous DVT Study Patient Name:  ARABELA BASALDUA  Date of Exam:   07/10/2021 Medical Rec #: [id number]           Accession #:    [id number] Date of Birth: [birth year]           Patient Gender: F Patient Age:   20 years Exam Location:  [facility] Procedure:      VAS [place] LOWER EXTREMITY VENOUS (DVT) Referring Phys: Jolaine Artist Amay Mijangos --------------------------------------------------------------------------------  Indications: Pain.  Risk Factors: None identified. Comparison  Study: No prior studies. Performing Technologist: Oliver Hum RVT  Examination Guidelines: A complete evaluation includes B-mode imaging, spectral Doppler, color Doppler, and power Doppler as needed of all accessible portions of each vessel. Bilateral testing is considered an integral part of a complete examination. Limited examinations for reoccurring indications may be performed as noted. The reflux portion of the exam is performed with the patient in reverse Trendelenburg.  +---------+---------------+---------+-----------+----------+--------------+  RIGHT  Compressibility Phasicity Spontaneity Properties Thrombus Aging  +---------+---------------+---------+-----------+----------+--------------+  CFV       Full            Yes       Yes                                    +---------+---------------+---------+-----------+----------+--------------+  SFJ       Full                                                             +---------+---------------+---------+-----------+----------+--------------+  FV Prox   Full                                                             +---------+---------------+---------+-----------+----------+--------------+  FV Mid    Full                                                             +---------+---------------+---------+-----------+----------+--------------+  FV Distal Full                                                             +---------+---------------+---------+-----------+----------+--------------+  PFV       Full                                                             +---------+---------------+---------+-----------+----------+--------------+  POP       Full            Yes       Yes                                    +---------+---------------+---------+-----------+----------+--------------+  PTV       Full                                                             +---------+---------------+---------+-----------+----------+--------------+  PERO      Full                                                              +---------+---------------+---------+-----------+----------+--------------+   +----+---------------+---------+-----------+----------+--------------+  LEFT Compressibility Phasicity Spontaneity Properties Thrombus Aging  +----+---------------+---------+-----------+----------+--------------+  CFV  Full            Yes       Yes                                    +----+---------------+---------+-----------+----------+--------------+     Summary: RIGHT: - There is no evidence of deep vein thrombosis in the lower extremity.  - No cystic structure found in the popliteal fossa.  LEFT: - No evidence of common femoral vein obstruction.  *See table(s) above for measurements and observations. Electronically signed by Orlie Pollen on 07/10/2021 at 6:58:08 PM.    Final         Scheduled Meds:  Chlorhexidine Gluconate Cloth  6 each Topical Q0600   famotidine  20 mg Oral Daily   FLUoxetine  40 mg Oral Daily   valACYclovir  1,000 mg Oral Daily   Continuous Infusions:  sodium chloride Stopped (07/10/21 0945)   ceFEPime (MAXIPIME) IV Stopped (07/10/21 2018)   lactated ringers     lactated ringers 125 mL/hr at 07/10/21 2100     LOS: 1 day    Time spent: 35 mins    Kathie Dike, MD [facility]   If 7PM-7AM, please contact night-coverage [email or web address]  07/10/2021, 10:14 PM

## 2021-07-10 NOTE — Procedures (Signed)
Interventional Radiology Procedure Note  Procedure:   Attempt at PCN into a right lower pole calyx, with obstructing infundibulum stone.   Unsuccessful with the drainage.    Discussed with Dr. Abner Greenspan. Discussed with Dr. Roderic Palau  Complications: Bacteremia at conclusion, with rigors.  Medications: Peri-operative Cipro 400mg , moderate sedation, 8mg  Zofran & 25mg  demerol, Liter saline bolus initiated  EBL: ~50cc  Recommendations: - continue treatment for bacteremia/rigors.  Stable now to ICU - Trend H&H - Routine wound care -VIR will follow  Signed,  Dulcy Fanny. Earleen Newport, DO

## 2021-07-10 NOTE — Consult Note (Signed)
Chief Complaint: Right obstructive renal stone. Request is for right nephrostomy tube placement.   Referring Physician(s): Dr. Jerilynn Mages. Gay  Supervising Physician: Corrie Mckusick  Patient Status: [facility] - In-pt  History of Present Illness: Colleen Reynolds is a 76 y.o. female History of a fib (on eliquis ) kidney stones. Presented to the ED at [facility] with  persistent and worsening right flank pain  X 1 day. With nausea, intractable vomiting and chills. Found to be febrile, hypotensive with an obstructive stone to the right lower pole calyx.   CT renal from 1.5.23 reads 5 mm calculus in the lower pole right kidney which appears to obstruct the lower pole calices. Associated perinephric and retroperitoneal fat stranding around the lower right kidney and inferiorly. Patient is being followed by Urology. Team is requesting aright side nephrostomy tube placement.   Currently endorsing generalized malaise and weakness.  Patient alert and laying in bed, calm and comfortable. Husband at bedside. States that right flank pain has improved. Return precautions and treatment recommendations and follow-up discussed with the patient and her husband who is agreeable with the plan.   Past Medical History:  Diagnosis Date   Depression    Diverticulosis    Kidney stones 2008   OA (osteoarthritis) of hip    Osteopenia    Shingles    Vitamin D deficiency     Past Surgical History:  Procedure Laterality Date   ABDOMINAL HYSTERECTOMY     BREAST REDUCTION SURGERY  1985   COLONOSCOPY  2005   FRACTURE SURGERY Left 2020   dislocated left elbow    LITHOTRIPSY  2008    Allergies: Clindamycin/lincomycin, Lovastatin, and Penicillins  Medications: Prior to Admission medications   Medication Sig Start Date End Date Taking? Authorizing Provider  apixaban (ELIQUIS) 5 MG TABS tablet Take 1 tablet (5 mg total) by mouth 2 (two) times daily. 05/14/21  Yes Billie Ruddy, MD  carvedilol (COREG) 3.125 MG tablet TAKE  1 TABLET BY MOUTH 2 TIMES DAILY WITH A MEAL. 06/15/21  Yes Billie Ruddy, MD  famotidine (PEPCID) 20 MG tablet Take 20 mg by mouth daily.    Yes [provider]  FLUoxetine (PROZAC) 40 MG capsule TAKE 1 CAPSULE BY MOUTH EVERY DAY 06/30/21  Yes Billie Ruddy, MD  valACYclovir (VALTREX) 1000 MG tablet Take 1 tablet (1,000 mg total) by mouth daily. 12/10/20  Yes Billie Ruddy, MD     Family History  Problem Relation Age of Onset   Colon cancer Neg Hx    Esophageal cancer Neg Hx    Rectal cancer Neg Hx    Stomach cancer Neg Hx     Social History   Socioeconomic History   Marital status: Married    Spouse name: Not on file   Number of children: Not on file   Years of education: Not on file   Highest education level: Not on file  Occupational History   Not on file  Tobacco Use   Smoking status: Never   Smokeless tobacco: Never  Vaping Use   Vaping Use: Never used  Substance and Sexual Activity   Alcohol use: Yes    Comment: socially   Drug use: No   Sexual activity: Not on file  Other Topics Concern   Not on file  Social History Narrative   Not on file   Social Determinants of Health   Financial Resource Strain: Low Risk    Difficulty of Paying Living Expenses: Not hard  at all  Food Insecurity: No Food Insecurity   Worried About Charity fundraiser in the Last Year: Never true   Ran Out of Food in the Last Year: Never true  Transportation Needs: No Transportation Needs   Lack of Transportation (Medical): No   Lack of Transportation (Non-Medical): No  Physical Activity: Inactive   Days of Exercise per Week: 0 days   Minutes of Exercise per Session: 0 min  Stress: No Stress Concern Present   Feeling of Stress : Not at all  Social Connections: Moderately Isolated   Frequency of Communication with Friends and Family: More than three times a week   Frequency of Social Gatherings with Friends and Family: Once a week   Attends Religious Services: Never    Marine scientist or Organizations: No   Attends Music therapist: Never   Marital Status: Married    Review of Systems: A 12 point ROS discussed and pertinent positives are indicated in the HPI above.  All other systems are negative.  Review of Systems  Constitutional:  Negative for fatigue and fever.  HENT:  Negative for congestion.   Respiratory:  Negative for cough and shortness of breath.   Gastrointestinal:  Negative for abdominal pain, diarrhea, nausea and vomiting.   Vital Signs: BP (!) 86/50    Pulse 99    Temp 98.3 F (36.8 C) (Oral)    Resp 20    Ht 5\' 6"  (1.676 m)    Wt 170 lb (77.1 kg)    SpO2 95%    BMI 27.44 kg/m   Physical Exam Vitals and nursing note reviewed.  Constitutional:      Appearance: She is well-developed.  HENT:     Head: Normocephalic and atraumatic.  Eyes:     Conjunctiva/sclera: Conjunctivae normal.  Cardiovascular:     Rate and Rhythm: Normal rate and regular rhythm.  Pulmonary:     Effort: Pulmonary effort is normal.     Breath sounds: Normal breath sounds.  Musculoskeletal:        General: Normal range of motion.     Cervical back: Normal range of motion.  Skin:    General: Skin is warm.  Neurological:     Mental Status: She is alert and oriented to person, place, and time.    Imaging: CT Renal Stone Study  Result Date: 07/09/2021 CLINICAL DATA:  Right flank pain EXAM: CT ABDOMEN AND PELVIS WITHOUT CONTRAST TECHNIQUE: Multidetector CT imaging of the abdomen and pelvis was performed following the standard protocol without IV contrast. COMPARISON:  None. FINDINGS: Lower chest: No acute abnormality. Hepatobiliary: No focal liver abnormality is seen. No gallstones, gallbladder wall thickening, or biliary dilatation. Pancreas: Atrophic with no suspicious mass or ductal dilatation visualized. Spleen: Normal in size without focal abnormality. Adrenals/Urinary Tract: Adrenal glands appear normal. 2 mm nonobstructing calculus in the  upper pole the left kidney. 5 mm calculus in the lower pole right kidney which is near the renal pelvis with asymmetric distention of the inferior pole calices. No ureteral calculi or frank hydronephrosis otherwise. There is perinephric fat stranding around the inferior pole the right kidney extending inferiorly in the retroperitoneum. Urinary bladder appears within normal limits. Stomach/Bowel: Intraluminal fluid in the visualized distal esophagus. Small hiatal hernia. No bowel obstruction, free air or pneumatosis. Colonic diverticulosis. No bowel wall edema. Appendix is normal. Vascular/Lymphatic: No significant vascular findings are present. No enlarged abdominal or pelvic lymph nodes. Reproductive: Status post hysterectomy. No adnexal masses.  Other: No ascites. Musculoskeletal: No suspicious bony lesions. IMPRESSION: 1. 5 mm calculus in the lower pole right kidney which appears to obstruct the lower pole calices. Associated perinephric and retroperitoneal fat stranding around the lower right kidney and inferiorly. 2. Small left renal calculus. 3. Colonic diverticulosis. 4. Small hiatal hernia. Fluid in the visualized distal esophagus, correlate for reflux. Electronically Signed   By: Ofilia Neas M.D.   On: 07/09/2021 10:23    Labs:  CBC: Recent Labs    04/22/21 0954 07/09/21 1000 07/10/21 0549  WBC 5.7 4.7 12.4*  HGB 15.2* 14.2 11.8*  HCT 45.2 41.4 35.3*  PLT 274.0 149* 126*    COAGS: No results for input(s): INR, APTT in the last 8760 hours.  BMP: Recent Labs    04/22/21 0954 07/09/21 1000 07/10/21 0549  NA 138 133* 138  K 4.2 3.6 3.8  CL 105 102 110  CO2 24 16* 20*  GLUCOSE 102* 181* 112*  BUN 15 18 18   CALCIUM 10.0 9.3 8.8*  CREATININE 0.99 1.17* 0.85  GFRNONAA  --  49* >60    LIVER FUNCTION TESTS: Recent Labs    04/22/21 0954 07/09/21 1000 07/10/21 0549  BILITOT 1.0 2.5* 1.7*  AST 17 22 27   ALT 15 19 22   ALKPHOS 85 86 48  PROT 7.3 7.2 5.9*  ALBUMIN 4.2 3.9  2.8*      Assessment and Plan:  76 y.o female inpatient. History of a fib (on eliquis ) kidney stones. Presented to the ED at [facility] with  persistent and worsening right flank pain  X 1 day. With nausea, intractable vomiting and chills. Found to be febrile, hypotensive with an obstructive stone to the right lower pole calyx.   CT renal from 1.5.23 reads 5 mm calculus in the lower pole right kidney which appears to obstruct the lower pole calices. Associated perinephric and retroperitoneal fat stranding around the lower right kidney and inferiorly. Patient is being followed by Urology. Team is requesting a right side nephrostomy tube placement.   WBC is 12.4. Lactic acid  and renal function WNL, Total protein 5.9, albumin 2.8, Total bilirubin 1.7. Last recorded B/P 86/50. MAP 62 Patient is afebrile. HR 99.0. Patient has been NPO since midnight.   Risks and benefits of right PCN placement was discussed with the patient including, but not limited to, infection, bleeding, significant bleeding causing loss or decrease in renal function or damage to adjacent structures.   All of the patient's questions were answered, patient is agreeable to proceed.  Consent signed and in chart.  Thank you for this interesting consult.  I greatly enjoyed meeting Colleen Reynolds and look forward to participating in their care.  A copy of this report was sent to the requesting provider on this date.  Electronically Signed: Jacqualine Mau, NP 07/10/2021, 9:14 AM   I spent a total of 40 Minutes    in face to face in clinical consultation, greater than 50% of which was counseling/coordinating care for bright nephrostomy tube placement.

## 2021-07-10 NOTE — Progress Notes (Signed)
PHARMACY - PHYSICIAN COMMUNICATION CRITICAL VALUE ALERT - BLOOD CULTURE IDENTIFICATION (BCID)  Colleen Reynolds is an 76 y.o. female who presented to [facility] on 07/09/2021 with a chief complaint of right flank pain.  CT abdomen did show concern for perinephric stranding as well as renal calculus.  Assessment:  Suspected urinary source 1/5 UCx: ip 1/5 BCx: 2/4 bottles (anaerobic bottle in each set): GNR 1/6 BCID: Escherichia coli, ESBL not detected  Name of physician (or Provider) Contacted: Dr Roderic Palau  Current antibiotics: Cefepime 2g IV q12h (day #2)  Changes to prescribed antibiotics recommended:  Recommendations accepted by provider Change Cefepime to Ceftriaxone 2g IV q24h  Results for orders placed or performed during the hospital encounter of 07/09/21  Blood Culture ID Panel (Reflexed) (Collected: 07/09/2021 11:46 AM)  Result Value Ref Range   Enterococcus faecalis NOT DETECTED NOT DETECTED   Enterococcus Faecium NOT DETECTED NOT DETECTED   Listeria monocytogenes NOT DETECTED NOT DETECTED   Staphylococcus species NOT DETECTED NOT DETECTED   Staphylococcus aureus (BCID) NOT DETECTED NOT DETECTED   Staphylococcus epidermidis NOT DETECTED NOT DETECTED   Staphylococcus lugdunensis NOT DETECTED NOT DETECTED   Streptococcus species NOT DETECTED NOT DETECTED   Streptococcus agalactiae NOT DETECTED NOT DETECTED   Streptococcus pneumoniae NOT DETECTED NOT DETECTED   Streptococcus pyogenes NOT DETECTED NOT DETECTED   A.calcoaceticus-baumannii NOT DETECTED NOT DETECTED   Bacteroides fragilis NOT DETECTED NOT DETECTED   Enterobacterales DETECTED (A) NOT DETECTED   Enterobacter cloacae complex NOT DETECTED NOT DETECTED   Escherichia coli DETECTED (A) NOT DETECTED   Klebsiella aerogenes NOT DETECTED NOT DETECTED   Klebsiella oxytoca NOT DETECTED NOT DETECTED   Klebsiella pneumoniae NOT DETECTED NOT DETECTED   Proteus species NOT DETECTED NOT DETECTED   Salmonella species NOT DETECTED  NOT DETECTED   Serratia marcescens NOT DETECTED NOT DETECTED   Haemophilus influenzae NOT DETECTED NOT DETECTED   Neisseria meningitidis NOT DETECTED NOT DETECTED   Pseudomonas aeruginosa NOT DETECTED NOT DETECTED   Stenotrophomonas maltophilia NOT DETECTED NOT DETECTED   Candida albicans NOT DETECTED NOT DETECTED   Candida auris NOT DETECTED NOT DETECTED   Candida glabrata NOT DETECTED NOT DETECTED   Candida krusei NOT DETECTED NOT DETECTED   Candida parapsilosis NOT DETECTED NOT DETECTED   Candida tropicalis NOT DETECTED NOT DETECTED   Cryptococcus neoformans/gattii NOT DETECTED NOT DETECTED   CTX-M ESBL NOT DETECTED NOT DETECTED   Carbapenem resistance IMP NOT DETECTED NOT DETECTED   Carbapenem resistance KPC NOT DETECTED NOT DETECTED   Carbapenem resistance NDM NOT DETECTED NOT DETECTED   Carbapenem resist OXA 48 LIKE NOT DETECTED NOT DETECTED   Carbapenem resistance VIM NOT DETECTED NOT DETECTED    Gretta Arab PharmD, BCPS Clinical Pharmacist [facility] main pharmacy [phone] 07/10/2021 7:17 AM

## 2021-07-10 NOTE — Consult Note (Signed)
° °  [facility] CM Inpatient Consult   07/10/2021  Colleen Reynolds [birth year] [id number]  Coverage for Hospital Liaison for [facility] [employer] [ACO] Patient: [name] [employer]  Primary Care Provider:  Billie Ruddy, MD,  Jacklynn Ganong, an Embedded provider with a chronic care management team and program  Plan:  Continue to follow progress and disposition to assess for post hospital care management needs.  No current needs assessed  For referral or questions contact:   Natividad Brood, RN BSN [facility] Liaison  [phone] business mobile phone Toll free office [phone]  Fax number: [phone] [email or web address]

## 2021-07-10 NOTE — Progress Notes (Signed)
Right lower extremity venous duplex has been completed. Preliminary results can be found in CV Proc through chart review.   07/10/21 1:21 PM Carlos Levering RVT

## 2021-07-10 NOTE — Progress Notes (Signed)
PHARMACY NOTE -  Cefepime  Pharmacy has been assisting with dosing of cefepime for sepsis. Dosage remains stable at 2g IV q8 hr and further renal adjustments per institutional Pharmacy antibiotic protocol  Pharmacy will sign off, following peripherally for culture results or dose adjustments. Please reconsult if a change in clinical status warrants re-evaluation of dosage.  Reuel Boom, PharmD, BCPS [phone] 07/10/2021, 7:28 PM

## 2021-07-10 NOTE — Progress Notes (Signed)
°  Transition of Care (TOC) Screening Note   Patient Details  Name: QUANISHA DREWRY Date of Birth: 18-Apr-1946   Transition of Care [facility]) CM/SW Contact:    Shanetta Nicolls, Marjie Skiff, RN Phone Number: 07/10/2021, 12:57 PM    Transition of Care Department [facility]) has reviewed patient and no TOC needs have been identified at this time. We will continue to monitor patient advancement through interdisciplinary progression rounds. If new patient transition needs arise, please place a TOC consult.

## 2021-07-10 NOTE — Progress Notes (Signed)
Patient's BP alarm was alerting hypotension. On reassessment BP reading is close to baseline.   VSS, will continue to monitor.

## 2021-07-11 ENCOUNTER — Inpatient Hospital Stay (HOSPITAL_COMMUNITY): Payer: Medicare HMO | Admitting: Certified Registered Nurse Anesthetist

## 2021-07-11 ENCOUNTER — Inpatient Hospital Stay (HOSPITAL_COMMUNITY): Payer: Medicare HMO

## 2021-07-11 ENCOUNTER — Encounter (HOSPITAL_COMMUNITY)
Admission: EM | Disposition: A | Discharge status: Home / Self Care | Payer: Self-pay | Source: Home / Self Care | Attending: Internal Medicine

## 2021-07-11 DIAGNOSIS — I4891 Unspecified atrial fibrillation: Secondary | ICD-10-CM

## 2021-07-11 HISTORY — PX: CYSTOSCOPY W/ URETERAL STENT PLACEMENT: SHX1429

## 2021-07-11 LAB — CBC
HCT: 32 % — ABNORMAL LOW (ref 36.0–46.0)
Hemoglobin: 10.6 g/dL — ABNORMAL LOW (ref 12.0–15.0)
MCH: 31.3 pg (ref 26.0–34.0)
MCHC: 33.1 g/dL (ref 30.0–36.0)
MCV: 94.4 fL (ref 80.0–100.0)
Platelets: 113 10*3/uL — ABNORMAL LOW (ref 150–400)
RBC: 3.39 MIL/uL — ABNORMAL LOW (ref 3.87–5.11)
RDW: 13.7 % (ref 11.5–15.5)
WBC: 9.9 10*3/uL (ref 4.0–10.5)
nRBC: 0 % (ref 0.0–0.2)

## 2021-07-11 LAB — BASIC METABOLIC PANEL
Anion gap: 5 (ref 5–15)
BUN: 14 mg/dL (ref 8–23)
CO2: 23 mmol/L (ref 22–32)
Calcium: 8.6 mg/dL — ABNORMAL LOW (ref 8.9–10.3)
Chloride: 107 mmol/L (ref 98–111)
Creatinine, Ser: 0.81 mg/dL (ref 0.44–1.00)
GFR, Estimated: >60 mL/min (ref >60)
Glucose, Bld: 98 mg/dL (ref 70–99)
Potassium: 3.6 mmol/L (ref 3.5–5.1)
Sodium: 135 mmol/L (ref 135–145)

## 2021-07-11 LAB — ECHOCARDIOGRAM COMPLETE
AR max vel: 2.31 cm2
AV Area VTI: 2.03 cm2
AV Area mean vel: 2.16 cm2
AV Mean grad: 4 mmHg
AV Peak grad: 6.3 mmHg
Ao pk vel: 1.25 m/s
Area-P 1/2: 4.06 cm2
Height: 66 in
S' Lateral: 2.9 cm
Weight: 2720 oz

## 2021-07-11 SURGERY — CYSTOSCOPY, WITH RETROGRADE PYELOGRAM AND URETERAL STENT INSERTION
Anesthesia: General | Site: Ureter | Laterality: Right

## 2021-07-11 MED ORDER — FENTANYL CITRATE (PF) 100 MCG/2ML IJ SOLN
INTRAMUSCULAR | Status: AC
  Filled 2021-07-11: qty 2

## 2021-07-11 MED ORDER — PROPOFOL 10 MG/ML IV BOLUS
INTRAVENOUS | Status: AC
  Filled 2021-07-11: qty 20

## 2021-07-11 MED ORDER — ONDANSETRON HCL 4 MG/2ML IJ SOLN
INTRAMUSCULAR | Status: DC | PRN
  Administered 2021-07-11: 4 mg via INTRAVENOUS

## 2021-07-11 MED ORDER — DEXAMETHASONE SODIUM PHOSPHATE 10 MG/ML IJ SOLN
INTRAMUSCULAR | Status: DC | PRN
  Administered 2021-07-11: 5 mg via INTRAVENOUS

## 2021-07-11 MED ORDER — OXYCODONE HCL 5 MG/5ML PO SOLN: 5.0000 mg | Freq: Once | ORAL | Status: DC | PRN

## 2021-07-11 MED ORDER — PHENYLEPHRINE 40 MCG/ML (10ML) SYRINGE FOR IV PUSH (FOR BLOOD PRESSURE SUPPORT)
PREFILLED_SYRINGE | INTRAVENOUS | Status: DC | PRN
  Administered 2021-07-11 (×7): 100 ug via INTRAVENOUS

## 2021-07-11 MED ORDER — LIDOCAINE HCL (CARDIAC) PF 100 MG/5ML IV SOSY
PREFILLED_SYRINGE | INTRAVENOUS | Status: DC | PRN
  Administered 2021-07-11: 60 mg via INTRAVENOUS

## 2021-07-11 MED ORDER — ONDANSETRON HCL 4 MG/2ML IJ SOLN
INTRAMUSCULAR | Status: AC
  Filled 2021-07-11: qty 2

## 2021-07-11 MED ORDER — LIDOCAINE 2% (20 MG/ML) 5 ML SYRINGE
INTRAMUSCULAR | Status: AC
  Filled 2021-07-11: qty 5

## 2021-07-11 MED ORDER — PHENYLEPHRINE 40 MCG/ML (10ML) SYRINGE FOR IV PUSH (FOR BLOOD PRESSURE SUPPORT)
PREFILLED_SYRINGE | INTRAVENOUS | Status: AC
  Filled 2021-07-11: qty 20

## 2021-07-11 MED ORDER — OXYCODONE HCL 5 MG PO TABS
5.0000 mg | ORAL_TABLET | Freq: Once | ORAL | Status: DC | PRN
Start: 2021-07-11 — End: 2021-07-11

## 2021-07-11 MED ORDER — DEXAMETHASONE SODIUM PHOSPHATE 10 MG/ML IJ SOLN
INTRAMUSCULAR | Status: AC
  Filled 2021-07-11: qty 1

## 2021-07-11 MED ORDER — ONDANSETRON HCL 4 MG/2ML IJ SOLN: 4.0000 mg | Freq: Four times a day (QID) | INTRAMUSCULAR | Status: DC | PRN

## 2021-07-11 MED ORDER — SODIUM CHLORIDE 0.9 % IR SOLN
Status: DC | PRN
  Administered 2021-07-11: 3000 mL via INTRAVESICAL

## 2021-07-11 MED ORDER — IOHEXOL 300 MG/ML  SOLN
INTRAMUSCULAR | Status: DC | PRN
  Administered 2021-07-11: 10 mL

## 2021-07-11 MED ORDER — FENTANYL CITRATE PF 50 MCG/ML IJ SOSY: 25.0000 ug | PREFILLED_SYRINGE | INTRAMUSCULAR | Status: DC | PRN

## 2021-07-11 MED ORDER — FENTANYL CITRATE (PF) 100 MCG/2ML IJ SOLN
INTRAMUSCULAR | Status: DC | PRN
  Administered 2021-07-11: 25 ug via INTRAVENOUS
  Administered 2021-07-11 (×2): 50 ug via INTRAVENOUS

## 2021-07-11 MED ORDER — PROPOFOL 10 MG/ML IV BOLUS
INTRAVENOUS | Status: DC | PRN
  Administered 2021-07-11: 140 mg via INTRAVENOUS

## 2021-07-11 SURGICAL SUPPLY — 18 items
BAG URO CATCHER STRL LF (MISCELLANEOUS) ×2 IMPLANT
BASKET ZERO TIP NITINOL 2.4FR (BASKET) IMPLANT
BSKT STON RTRVL ZERO TP 2.4FR (BASKET)
CATH URETL OPEN 5X70 (CATHETERS) ×2 IMPLANT
CATH UROLOGY TORQUE 65 (CATHETERS) ×1 IMPLANT
CLOTH BEACON ORANGE TIMEOUT ST (SAFETY) ×2 IMPLANT
EXTRACTOR STONE 1.7FRX115CM (UROLOGICAL SUPPLIES) IMPLANT
GLOVE SURG ENC TEXT LTX SZ7.5 (GLOVE) ×2 IMPLANT
GOWN STRL REUS W/TWL XL LVL3 (GOWN DISPOSABLE) ×2 IMPLANT
GUIDEWIRE ANG ZIPWIRE 038X150 (WIRE) ×1 IMPLANT
GUIDEWIRE STR DUAL SENSOR (WIRE) ×2 IMPLANT
MANIFOLD NEPTUNE II (INSTRUMENTS) ×2 IMPLANT
PACK CYSTO (CUSTOM PROCEDURE TRAY) ×2 IMPLANT
SHEATH NAVIGATOR HD 11/13X28 (SHEATH) IMPLANT
SHEATH NAVIGATOR HD 11/13X36 (SHEATH) IMPLANT
STENT URET 6FRX24 CONTOUR (STENTS) ×1 IMPLANT
TUBING CONNECTING 10 (TUBING) ×2 IMPLANT
TUBING UROLOGY SET (TUBING) ×2 IMPLANT

## 2021-07-11 NOTE — Consult Note (Signed)
Urology Consult Note   Requesting Attending Physician:  Kathie Dike, MD Service Providing Consult: Urology   Reason for Consult:  Nephrolithiasis  Interval 07/10/21: Blood pressures remain soft with MAPs in 60s. Patient is on IVF at 125 cc per hour. Tachycardic in 110-120 in room. Afebrile. WBC 12. Alert, oriented and pleasantly conversant. Creatinine 0.9     Past Medical History: Past Medical History:  Diagnosis Date   Depression    Diverticulosis    Kidney stones 2008   OA (osteoarthritis) of hip    Osteopenia    Shingles    Vitamin D deficiency     Past Surgical History:  Past Surgical History:  Procedure Laterality Date   ABDOMINAL HYSTERECTOMY     BREAST REDUCTION SURGERY  1985   COLONOSCOPY  2005   FRACTURE SURGERY Left 2020   dislocated left elbow    IR NEPHROSTOMY PLACEMENT RIGHT  07/10/2021   LITHOTRIPSY  2008    Medication: Current Facility-Administered Medications  Medication Dose Route Frequency Provider Last Rate Last Admin   0.9 %  sodium chloride infusion   Intravenous PRN Kathie Dike, MD   Stopped at 07/10/21 0945   acetaminophen (TYLENOL) tablet 650 mg  650 mg Oral Q6H PRN Kathie Dike, MD   650 mg at 07/10/21 1949   Or   acetaminophen (TYLENOL) suppository 650 mg  650 mg Rectal Q6H PRN Kathie Dike, MD       ceFEPIme (MAXIPIME) 2 g in sodium chloride 0.9 % 100 mL IVPB  2 g Intravenous Q8H Wofford, Drew A, RPH 200 mL/hr at 07/11/21 1610 Infusion Verify at 07/11/21 9604   Chlorhexidine Gluconate Cloth 2 % PADS 6 each  6 each Topical Q0600 Kathie Dike, MD   6 each at 07/11/21 0630   famotidine (PEPCID) tablet 20 mg  20 mg Oral Daily Kathie Dike, MD   20 mg at 07/10/21 0947   FLUoxetine (PROZAC) capsule 40 mg  40 mg Oral Daily Kathie Dike, MD   40 mg at 07/10/21 0948   lactated ringers bolus 1,000 mL  1,000 mL Intravenous Once Kathie Dike, MD       lactated ringers infusion   Intravenous Continuous Kathie Dike, MD 125 mL/hr  at 07/11/21 5409 Continued from Pre-op at 07/11/21 8119   morphine 2 MG/ML injection 2 mg  2 mg Intravenous Q2H PRN Kathie Dike, MD   2 mg at 07/09/21 2002   ondansetron (ZOFRAN) tablet 4 mg  4 mg Oral Q6H PRN Kathie Dike, MD       Or   ondansetron (ZOFRAN) injection 4 mg  4 mg Intravenous Q6H PRN Kathie Dike, MD   4 mg at 07/10/21 0130   valACYclovir (VALTREX) tablet 1,000 mg  1,000 mg Oral Daily Kathie Dike, MD   1,000 mg at 07/10/21 1031    Allergies: Allergies  Allergen Reactions   Clindamycin/Lincomycin Nausea And Vomiting   Lovastatin Other (See Comments)    Fatigue, muscle aches   Penicillins     Tolerated Rocephin 07/10/21    Social History: Social History   Tobacco Use   Smoking status: Never   Smokeless tobacco: Never  Vaping Use   Vaping Use: Never used  Substance Use Topics   Alcohol use: Yes    Comment: socially   Drug use: No    Family History Family History  Problem Relation Age of Onset   Colon cancer Neg Hx    Esophageal cancer Neg Hx    Rectal cancer Neg  Hx    Stomach cancer Neg Hx     Review of Systems 10 systems were reviewed and are negative except as noted specifically in the HPI.  Objective   Vital signs in last 24 hours: BP (!) 123/44    Pulse 94    Temp 97.9 F (36.6 C) (Oral)    Resp (!) 27    Ht 5\' 6"  (1.676 m)    Wt 77.1 kg    SpO2 99%    BMI 27.44 kg/m   Physical Exam General: NAD, A&O, resting, appropriate HEENT: /AT, EOMI, MMM Pulmonary: Normal work of breathing Cardiovascular: HDS, adequate peripheral perfusion Abdomen: Soft, NTTP, nondistended GU: Mild right CVA tenderness Extremities: warm and well perfused Neuro: Appropriate, no focal neurological deficits  Most Recent Labs: Lab Results  Component Value Date   WBC 9.9 07/11/2021   HGB 10.6 (L) 07/11/2021   HCT 32.0 (L) 07/11/2021   PLT 113 (L) 07/11/2021    Lab Results  Component Value Date   NA 135 07/11/2021   K 3.6 07/11/2021   CL 107  07/11/2021   CO2 23 07/11/2021   BUN 14 07/11/2021   CREATININE 0.81 07/11/2021   CALCIUM 8.6 (L) 07/11/2021    Lab Results  Component Value Date   INR 1.2 07/10/2021     Urine Culture: @[id number] (laburin,org,[id number],[id number])@   IMAGING: CT Renal Stone Study  Result Date: 07/09/2021 CLINICAL DATA:  Right flank pain EXAM: CT ABDOMEN AND PELVIS WITHOUT CONTRAST TECHNIQUE: Multidetector CT imaging of the abdomen and pelvis was performed following the standard protocol without IV contrast. COMPARISON:  None. FINDINGS: Lower chest: No acute abnormality. Hepatobiliary: No focal liver abnormality is seen. No gallstones, gallbladder wall thickening, or biliary dilatation. Pancreas: Atrophic with no suspicious mass or ductal dilatation visualized. Spleen: Normal in size without focal abnormality. Adrenals/Urinary Tract: Adrenal glands appear normal. 2 mm nonobstructing calculus in the upper pole the left kidney. 5 mm calculus in the lower pole right kidney which is near the renal pelvis with asymmetric distention of the inferior pole calices. No ureteral calculi or frank hydronephrosis otherwise. There is perinephric fat stranding around the inferior pole the right kidney extending inferiorly in the retroperitoneum. Urinary bladder appears within normal limits. Stomach/Bowel: Intraluminal fluid in the visualized distal esophagus. Small hiatal hernia. No bowel obstruction, free air or pneumatosis. Colonic diverticulosis. No bowel wall edema. Appendix is normal. Vascular/Lymphatic: No significant vascular findings are present. No enlarged abdominal or pelvic lymph nodes. Reproductive: Status post hysterectomy. No adnexal masses. Other: No ascites. Musculoskeletal: No suspicious bony lesions. IMPRESSION: 1. 5 mm calculus in the lower pole right kidney which appears to obstruct the lower pole calices. Associated perinephric and retroperitoneal fat stranding around the lower right kidney and inferiorly. 2. Small  left renal calculus. 3. Colonic diverticulosis. 4. Small hiatal hernia. Fluid in the visualized distal esophagus, correlate for reflux. Electronically Signed   By: Ofilia Neas M.D.   On: 07/09/2021 10:23   IR NEPHROSTOMY PLACEMENT RIGHT  Result Date: 07/10/2021 INDICATION: 76 year old female presents to the hospital with urosepsis secondary 2 a stone in the lower pole collecting system infundibulum, with local pelvicaliectasis of the lower pole. She has been referred for attempt at PCN. EXAM: IMAGE GUIDED PERCUTANEOUS NEPHROSTOMY COMPARISON:  CT 07/09/2021 MEDICATIONS: Ciprofloxacin 400 mg IV; The antibiotic was administered in an appropriate time frame prior to skin puncture. ANESTHESIA/SEDATION: Moderate (conscious) sedation was employed during this procedure. A total of Versed 3.5 mg and Fentanyl 200 mcg was administered  intravenously by the radiology nurse. Total intra-service moderate Sedation Time: 98 minutes. The patient's level of consciousness and vital signs were monitored continuously by radiology nursing throughout the procedure under my direct supervision. CONTRAST:  80 cc-administered into the collecting system(s) FLUOROSCOPY TIME:  Fluoroscopy Time: 41 minutes 24 seconds (599 mGy). COMPLICATIONS: SIR LEVEL B - Normal therapy, includes overnight admission for observation. Intravasation of contrast at the conclusion of the case, with bacteremia/rigors PROCEDURE: Informed written consent was obtained from the patient after a thorough discussion of the procedural risks, benefits and alternatives. All questions were addressed. Maximal Sterile Barrier Technique was utilized including caps, mask, sterile gowns, sterile gloves, sterile drape, hand hygiene and skin antiseptic. A timeout was performed prior to the initiation of the procedure. Patient positioned prone position on the fluoroscopy table. Ultrasound survey of the right flank was performed with images stored and sent to PACs. The patient  was then prepped and draped in the usual sterile fashion. 1% lidocaine was used to anesthetize the skin and subcutaneous tissues for local anesthesia. An Envy/chiba needle was then used in attempt to access the mildly dilated posterior inferior calyx with ultrasound guidance. After 3 attempts, we elected to forego ultrasound. A second needle puncture was then performed, targeting the infundibulum stone with a second needle from an AP approach. 1% lidocaine was used for local anesthesia, and the needle was then used to puncture the infundibulum at the site of the stone. Contrast opacification was performed. The second needle was then used in attempt to access the opacified lower pole and most posterior calyx. On the initial needle puncture, the needle tip was within the lower pole posterior calyx, however, the initial angulation was not favorable for passing the wire. A second more inferior needle approach was then performed, for a favorable angle into the calyx. On this second needle puncture the wire was passed into the lower pole collecting system calyx, though would not traverse the region the impacted stone. The Envy tri-axial system was then advanced over the wire into the collecting system under fluoroscopy. The metal stiffener and inner dilator were removed, and contrast was injected. Contrast was clearly not within the collecting system/lower pole calyx on this passage of the triaxial system. The 4 French sheath was removed. At this point, there was not a needle within the collecting system, and another puncture of the stone was attempted. From here forward, all of the contrast was injected demonstrated some element of venous intravasation. The same needle trajectory from a caudal right posterior oblique approach was performed into the collapsed collecting system. Multiple needle punctures were performed, with only partial opacification. Ultimately the end the triaxial system was again past, which was only  partially within the collapse collecting system. Multiple attempts of passing a 018 wire was performed, ultimately not within the lower pole calyx. The patient began to experience rigors after approximately 1 hour and 30 minutes of working time, 41 minutes of fluoroscopy time. We aborted given that there was a clear venous communication with the disrupted lower pole calyx. 30 cc estimated blood loss. IMPRESSION: Status post image guided attempt at a percutaneous nephrostomy of the isolated obstruction of the right lower pole calyx secondary to impacted infundibular stone. Ultimately, we were unable to place the catheter in the small space. Signed, Dulcy Fanny. Dellia Nims, RPVI [facility] [place] [facility] Electronically Signed   By: Corrie Mckusick D.O.   On: 07/10/2021 18:03   VAS [place] LOWER EXTREMITY VENOUS (DVT)  Result Date: 07/10/2021  Lower Venous DVT Study Patient Name:  JOSELINE MCCAMPBELL  Date of Exam:   07/10/2021 Medical Rec #: [id number]           Accession #:    [id number] Date of Birth: 06-Jan-1946           Patient Gender: F Patient Age:   76 years Exam Location:  [facility] Procedure:      VAS [place] LOWER EXTREMITY VENOUS (DVT) Referring Phys: Jolaine Artist MEMON --------------------------------------------------------------------------------  Indications: Pain.  Risk Factors: None identified. Comparison Study: No prior studies. Performing Technologist: Oliver Hum RVT  Examination Guidelines: A complete evaluation includes B-mode imaging, spectral Doppler, color Doppler, and power Doppler as needed of all accessible portions of each vessel. Bilateral testing is considered an integral part of a complete examination. Limited examinations for reoccurring indications may be performed as noted. The reflux portion of the exam is performed with the patient in reverse Trendelenburg.  +---------+---------------+---------+-----------+----------+--------------+  RIGHT      Compressibility Phasicity Spontaneity Properties Thrombus Aging  +---------+---------------+---------+-----------+----------+--------------+  CFV       Full            Yes       Yes                                    +---------+---------------+---------+-----------+----------+--------------+  SFJ       Full                                                             +---------+---------------+---------+-----------+----------+--------------+  FV Prox   Full                                                             +---------+---------------+---------+-----------+----------+--------------+  FV Mid    Full                                                             +---------+---------------+---------+-----------+----------+--------------+  FV Distal Full                                                             +---------+---------------+---------+-----------+----------+--------------+  PFV       Full                                                             +---------+---------------+---------+-----------+----------+--------------+  POP       Full  Yes       Yes                                    +---------+---------------+---------+-----------+----------+--------------+  PTV       Full                                                             +---------+---------------+---------+-----------+----------+--------------+  PERO      Full                                                             +---------+---------------+---------+-----------+----------+--------------+   +----+---------------+---------+-----------+----------+--------------+  LEFT Compressibility Phasicity Spontaneity Properties Thrombus Aging  +----+---------------+---------+-----------+----------+--------------+  CFV  Full            Yes       Yes                                    +----+---------------+---------+-----------+----------+--------------+     Summary: RIGHT: - There is no evidence of deep vein thrombosis in the lower  extremity.  - No cystic structure found in the popliteal fossa.  LEFT: - No evidence of common femoral vein obstruction.  *See table(s) above for measurements and observations. Electronically signed by Orlie Pollen on 07/10/2021 at 6:58:08 PM.    Final     ------  Assessment:  76 y.o. female with PMH A fib on Elliquis who presented with right flank pain, nausea and UT symptoms and CT that demonstrates 54mm obstructing stone with  associated hydronephrosis of obstructed right lower pole.   Patient now demonstrating both Enterococcus and E. coli in her bloodstream, her urine cultures are negative.  Interventional radiology unable to access obstructed lower pole infundibulum.   Recommendations: Given positive blood cultures and hemodynamic instability, I recommended that we proceed to the operating room and attempt to decompress the lower pole of her right kidney.  I discussed the risk and associated benefits of this procedure.  Having gone through the procedure in detail the patient is opted to proceed.

## 2021-07-11 NOTE — Progress Notes (Signed)
Patient is s/p attempted and failed R PCN placement with IR on 07/10/21.   Chart reviewed, patient underwent right ureteral stent placement with urology today.  Hgb stable, VSS no signs of possible acute bleeding from attempted R PCN placement yesterday.   IR will sign off but is available for assistant 24/7. Please call on call Ir radiologist for questions and concerns over weekend.   Armando Gang Capone Schwinn PA-C 07/11/2021 3:01 PM

## 2021-07-11 NOTE — Progress Notes (Signed)
Pt transported by PACU RN to surgical suite at shift change.  RN will assess pt upon return post-op.  Justice Britain, RN

## 2021-07-11 NOTE — H&P (View-Only) (Signed)
Urology Consult Note   Requesting Attending Physician:  Kathie Dike, MD Service Providing Consult: Urology   Reason for Consult:  Nephrolithiasis  Interval 07/10/21: Blood pressures remain soft with MAPs in 60s. Patient is on IVF at 125 cc per hour. Tachycardic in 110-120 in room. Afebrile. WBC 12. Alert, oriented and pleasantly conversant. Creatinine 0.9     Past Medical History: Past Medical History:  Diagnosis Date   Depression    Diverticulosis    Kidney stones 2008   OA (osteoarthritis) of hip    Osteopenia    Shingles    Vitamin D deficiency     Past Surgical History:  Past Surgical History:  Procedure Laterality Date   ABDOMINAL HYSTERECTOMY     BREAST REDUCTION SURGERY  1985   COLONOSCOPY  2005   FRACTURE SURGERY Left 2020   dislocated left elbow    IR NEPHROSTOMY PLACEMENT RIGHT  07/10/2021   LITHOTRIPSY  2008    Medication: Current Facility-Administered Medications  Medication Dose Route Frequency Provider Last Rate Last Admin   0.9 %  sodium chloride infusion   Intravenous PRN Kathie Dike, MD   Stopped at 07/10/21 0945   acetaminophen (TYLENOL) tablet 650 mg  650 mg Oral Q6H PRN Kathie Dike, MD   650 mg at 07/10/21 1949   Or   acetaminophen (TYLENOL) suppository 650 mg  650 mg Rectal Q6H PRN Kathie Dike, MD       ceFEPIme (MAXIPIME) 2 g in sodium chloride 0.9 % 100 mL IVPB  2 g Intravenous Q8H Wofford, Drew A, RPH 200 mL/hr at 07/11/21 7253 Infusion Verify at 07/11/21 6644   Chlorhexidine Gluconate Cloth 2 % PADS 6 each  6 each Topical Q0600 Kathie Dike, MD   6 each at 07/11/21 0630   famotidine (PEPCID) tablet 20 mg  20 mg Oral Daily Kathie Dike, MD   20 mg at 07/10/21 0947   FLUoxetine (PROZAC) capsule 40 mg  40 mg Oral Daily Kathie Dike, MD   40 mg at 07/10/21 0948   lactated ringers bolus 1,000 mL  1,000 mL Intravenous Once Kathie Dike, MD       lactated ringers infusion   Intravenous Continuous Kathie Dike, MD 125 mL/hr  at 07/11/21 0347 Continued from Pre-op at 07/11/21 4259   morphine 2 MG/ML injection 2 mg  2 mg Intravenous Q2H PRN Kathie Dike, MD   2 mg at 07/09/21 2002   ondansetron (ZOFRAN) tablet 4 mg  4 mg Oral Q6H PRN Kathie Dike, MD       Or   ondansetron (ZOFRAN) injection 4 mg  4 mg Intravenous Q6H PRN Kathie Dike, MD   4 mg at 07/10/21 0130   valACYclovir (VALTREX) tablet 1,000 mg  1,000 mg Oral Daily Kathie Dike, MD   1,000 mg at 07/10/21 1031    Allergies: Allergies  Allergen Reactions   Clindamycin/Lincomycin Nausea And Vomiting   Lovastatin Other (See Comments)    Fatigue, muscle aches   Penicillins     Tolerated Rocephin 07/10/21    Social History: Social History   Tobacco Use   Smoking status: Never   Smokeless tobacco: Never  Vaping Use   Vaping Use: Never used  Substance Use Topics   Alcohol use: Yes    Comment: socially   Drug use: No    Family History Family History  Problem Relation Age of Onset   Colon cancer Neg Hx    Esophageal cancer Neg Hx    Rectal cancer Neg  Hx    Stomach cancer Neg Hx     Review of Systems 10 systems were reviewed and are negative except as noted specifically in the HPI.  Objective   Vital signs in last 24 hours: BP (!) 123/44    Pulse 94    Temp 97.9 F (36.6 C) (Oral)    Resp (!) 27    Ht 5\' 6"  (1.676 m)    Wt 77.1 kg    SpO2 99%    BMI 27.44 kg/m   Physical Exam General: NAD, A&O, resting, appropriate HEENT: [place]/AT, EOMI, MMM Pulmonary: Normal work of breathing Cardiovascular: HDS, adequate peripheral perfusion Abdomen: Soft, NTTP, nondistended GU: Mild right CVA tenderness Extremities: warm and well perfused Neuro: Appropriate, no focal neurological deficits  Most Recent Labs: Lab Results  Component Value Date   WBC 9.9 07/11/2021   HGB 10.6 (L) 07/11/2021   HCT 32.0 (L) 07/11/2021   PLT 113 (L) 07/11/2021    Lab Results  Component Value Date   NA 135 07/11/2021   K 3.6 07/11/2021   CL 107  07/11/2021   CO2 23 07/11/2021   BUN 14 07/11/2021   CREATININE 0.81 07/11/2021   CALCIUM 8.6 (L) 07/11/2021    Lab Results  Component Value Date   INR 1.2 07/10/2021     Urine Culture: @[id number] (laburin,org,[id number],[id number])@   IMAGING: CT Renal Stone Study  Result Date: 07/09/2021 CLINICAL DATA:  Right flank pain EXAM: CT ABDOMEN AND PELVIS WITHOUT CONTRAST TECHNIQUE: Multidetector CT imaging of the abdomen and pelvis was performed following the standard protocol without IV contrast. COMPARISON:  None. FINDINGS: Lower chest: No acute abnormality. Hepatobiliary: No focal liver abnormality is seen. No gallstones, gallbladder wall thickening, or biliary dilatation. Pancreas: Atrophic with no suspicious mass or ductal dilatation visualized. Spleen: Normal in size without focal abnormality. Adrenals/Urinary Tract: Adrenal glands appear normal. 2 mm nonobstructing calculus in the upper pole the left kidney. 5 mm calculus in the lower pole right kidney which is near the renal pelvis with asymmetric distention of the inferior pole calices. No ureteral calculi or frank hydronephrosis otherwise. There is perinephric fat stranding around the inferior pole the right kidney extending inferiorly in the retroperitoneum. Urinary bladder appears within normal limits. Stomach/Bowel: Intraluminal fluid in the visualized distal esophagus. Small hiatal hernia. No bowel obstruction, free air or pneumatosis. Colonic diverticulosis. No bowel wall edema. Appendix is normal. Vascular/Lymphatic: No significant vascular findings are present. No enlarged abdominal or pelvic lymph nodes. Reproductive: Status post hysterectomy. No adnexal masses. Other: No ascites. Musculoskeletal: No suspicious bony lesions. IMPRESSION: 1. 5 mm calculus in the lower pole right kidney which appears to obstruct the lower pole calices. Associated perinephric and retroperitoneal fat stranding around the lower right kidney and inferiorly. 2. Small  left renal calculus. 3. Colonic diverticulosis. 4. Small hiatal hernia. Fluid in the visualized distal esophagus, correlate for reflux. Electronically Signed   By: Ofilia Neas M.D.   On: 07/09/2021 10:23   IR NEPHROSTOMY PLACEMENT RIGHT  Result Date: 07/10/2021 INDICATION: 76 year old female presents to the hospital with urosepsis secondary 2 a stone in the lower pole collecting system infundibulum, with local pelvicaliectasis of the lower pole. She has been referred for attempt at PCN. EXAM: IMAGE GUIDED PERCUTANEOUS NEPHROSTOMY COMPARISON:  CT 07/09/2021 MEDICATIONS: Ciprofloxacin 400 mg IV; The antibiotic was administered in an appropriate time frame prior to skin puncture. ANESTHESIA/SEDATION: Moderate (conscious) sedation was employed during this procedure. A total of Versed 3.5 mg and Fentanyl 200 mcg was administered  intravenously by the radiology nurse. Total intra-service moderate Sedation Time: 98 minutes. The patient's level of consciousness and vital signs were monitored continuously by radiology nursing throughout the procedure under my direct supervision. CONTRAST:  80 cc-administered into the collecting system(s) FLUOROSCOPY TIME:  Fluoroscopy Time: 41 minutes 24 seconds (599 mGy). COMPLICATIONS: SIR LEVEL B - Normal therapy, includes overnight admission for observation. Intravasation of contrast at the conclusion of the case, with bacteremia/rigors PROCEDURE: Informed written consent was obtained from the patient after a thorough discussion of the procedural risks, benefits and alternatives. All questions were addressed. Maximal Sterile Barrier Technique was utilized including caps, mask, sterile gowns, sterile gloves, sterile drape, hand hygiene and skin antiseptic. A timeout was performed prior to the initiation of the procedure. Patient positioned prone position on the fluoroscopy table. Ultrasound survey of the right flank was performed with images stored and sent to PACs. The patient  was then prepped and draped in the usual sterile fashion. 1% lidocaine was used to anesthetize the skin and subcutaneous tissues for local anesthesia. An Envy/chiba needle was then used in attempt to access the mildly dilated posterior inferior calyx with ultrasound guidance. After 3 attempts, we elected to forego ultrasound. A second needle puncture was then performed, targeting the infundibulum stone with a second needle from an AP approach. 1% lidocaine was used for local anesthesia, and the needle was then used to puncture the infundibulum at the site of the stone. Contrast opacification was performed. The second needle was then used in attempt to access the opacified lower pole and most posterior calyx. On the initial needle puncture, the needle tip was within the lower pole posterior calyx, however, the initial angulation was not favorable for passing the wire. A second more inferior needle approach was then performed, for a favorable angle into the calyx. On this second needle puncture the wire was passed into the lower pole collecting system calyx, though would not traverse the region the impacted stone. The Envy tri-axial system was then advanced over the wire into the collecting system under fluoroscopy. The metal stiffener and inner dilator were removed, and contrast was injected. Contrast was clearly not within the collecting system/lower pole calyx on this passage of the triaxial system. The 4 French sheath was removed. At this point, there was not a needle within the collecting system, and another puncture of the stone was attempted. From here forward, all of the contrast was injected demonstrated some element of venous intravasation. The same needle trajectory from a caudal right posterior oblique approach was performed into the collapsed collecting system. Multiple needle punctures were performed, with only partial opacification. Ultimately the end the triaxial system was again past, which was only  partially within the collapse collecting system. Multiple attempts of passing a 018 wire was performed, ultimately not within the lower pole calyx. The patient began to experience rigors after approximately 1 hour and 30 minutes of working time, 41 minutes of fluoroscopy time. We aborted given that there was a clear venous communication with the disrupted lower pole calyx. 30 cc estimated blood loss. IMPRESSION: Status post image guided attempt at a percutaneous nephrostomy of the isolated obstruction of the right lower pole calyx secondary to impacted infundibular stone. Ultimately, we were unable to place the catheter in the small space. Signed, Dulcy Fanny. Dellia Nims, RPVI [facility] Radiology Electronically Signed   By: Corrie Mckusick D.O.   On: 07/10/2021 18:03   VAS [place] LOWER EXTREMITY VENOUS (DVT)  Result Date: 07/10/2021  Lower Venous DVT Study Patient Name:  Colleen Reynolds  Date of Exam:   07/10/2021 Medical Rec #: [id number]           Accession #:    [id number] Date of Birth: 1945/11/04           Patient Gender: F Patient Age:   76 years Exam Location:  [facility] Procedure:      VAS [place] LOWER EXTREMITY VENOUS (DVT) Referring Phys: Jolaine Artist MEMON --------------------------------------------------------------------------------  Indications: Pain.  Risk Factors: None identified. Comparison Study: No prior studies. Performing Technologist: Oliver Hum RVT  Examination Guidelines: A complete evaluation includes B-mode imaging, spectral Doppler, color Doppler, and power Doppler as needed of all accessible portions of each vessel. Bilateral testing is considered an integral part of a complete examination. Limited examinations for reoccurring indications may be performed as noted. The reflux portion of the exam is performed with the patient in reverse Trendelenburg.  +---------+---------------+---------+-----------+----------+--------------+  RIGHT      Compressibility Phasicity Spontaneity Properties Thrombus Aging  +---------+---------------+---------+-----------+----------+--------------+  CFV       Full            Yes       Yes                                    +---------+---------------+---------+-----------+----------+--------------+  SFJ       Full                                                             +---------+---------------+---------+-----------+----------+--------------+  FV Prox   Full                                                             +---------+---------------+---------+-----------+----------+--------------+  FV Mid    Full                                                             +---------+---------------+---------+-----------+----------+--------------+  FV Distal Full                                                             +---------+---------------+---------+-----------+----------+--------------+  PFV       Full                                                             +---------+---------------+---------+-----------+----------+--------------+  POP       Full  Yes       Yes                                    +---------+---------------+---------+-----------+----------+--------------+  PTV       Full                                                             +---------+---------------+---------+-----------+----------+--------------+  PERO      Full                                                             +---------+---------------+---------+-----------+----------+--------------+   +----+---------------+---------+-----------+----------+--------------+  LEFT Compressibility Phasicity Spontaneity Properties Thrombus Aging  +----+---------------+---------+-----------+----------+--------------+  CFV  Full            Yes       Yes                                    +----+---------------+---------+-----------+----------+--------------+     Summary: RIGHT: - There is no evidence of deep vein thrombosis in the lower  extremity.  - No cystic structure found in the popliteal fossa.  LEFT: - No evidence of common femoral vein obstruction.  *See table(s) above for measurements and observations. Electronically signed by Orlie Pollen on 07/10/2021 at 6:58:08 PM.    Final     ------  Assessment:  76 y.o. female with PMH A fib on Elliquis who presented with right flank pain, nausea and UT symptoms and CT that demonstrates 78mm obstructing stone with  associated hydronephrosis of obstructed right lower pole.   Patient now demonstrating both Enterococcus and E. coli in her bloodstream, her urine cultures are negative.  Interventional radiology unable to access obstructed lower pole infundibulum.   Recommendations: Given positive blood cultures and hemodynamic instability, I recommended that we proceed to the operating room and attempt to decompress the lower pole of her right kidney.  I discussed the risk and associated benefits of this procedure.  Having gone through the procedure in detail the patient is opted to proceed.

## 2021-07-11 NOTE — Op Note (Signed)
Preoperative diagnosis:  Right lower pole infundibular obstructing stone Bacteremia  Postoperative diagnosis:  Same  Procedure:  Cystoscopy right ureteral stent placement right retrograde pyelography with interpretation   Surgeon: Ardis Hughs, MD  Anesthesia: General  Complications: None  Intraoperative findings:  #1: The patient's retrograde pyelogram demonstrated normal caliber ureter with no filling duct defects or abnormalities.  There was no hydroureteronephrosis.  The lower pole posterior infundibulum did not fill with contrast, but the stone was radiopaque and easily visualized. 2.:  The acute angle and high-grade obstruction made accessing this lower pole infundibulum difficult.  In order to gain access I needed to use a angled Glidewire and a angled angiographic catheter.  Ultimately after 15 minutes of fluoroscopy I was able to gain access into this infundibulum.  Once I was in the infundibulum there was purulent bloody E flux. 3.:  A 24 cm time 6 French double-J ureteral stent was placed in the lower pole infundibulum with a nice curl noted within the bladder as well.  EBL: Minimal  Specimens: None  Indication: Colleen Reynolds is a 76 y.o. patient with urosepsis secondary to an obstructing stone in the right lower pole infundibulum.  Several attempts had been made by interventional radiology to access his lower pole which ultimately was unsuccessful. After reviewing the management options for treatment, she elected to proceed with the above surgical procedure(s). We have discussed the potential benefits and risks of the procedure, side effects of the proposed treatment, the likelihood of the patient achieving the goals of the procedure, and any potential problems that might occur during the procedure or recuperation. Informed consent has been obtained.  Description of procedure:  The patient was taken to the operating room and general anesthesia was induced.  The  patient was placed in the dorsal lithotomy position, prepped and draped in the usual sterile fashion, and preoperative antibiotics were administered. A preoperative time-out was performed.   Cystourethroscopy was performed.  The patients urethra was examined and was normal. The bladder was then systematically examined in its entirety. There was no evidence for any bladder tumors, stones, or other mucosal pathology.    Attention then turned to the rightureteral orifice and a ureteral catheter was used to intubate the ureteral orifice.  Omnipaque contrast was injected through the ureteral catheter and a retrograde pyelogram was performed with findings as dictated above.  A 0.38 sensor guidewire was then advanced up the right ureter into the renal pelvis under fluoroscopic guidance.  Over the wire I advanced a 5 French angiographic catheter with a 90 degree tip.  I then exchanged the sensor wire for a angled tipped Glidewire and ultimately was able to loop the angiographic catheter within the renal pelvis and then exchanged for a stiffer sensor wire and was able to get the wire across the infundibulum beyond the stone and into the lower pole calyx.  I then exchanged the angiographic catheter for 24x6 French double-J ureteral stent and under fluoroscopic guidance advanced the stent into this calyx.  Once it was well within the calyx I advanced the stent to the bladder neck and removed the wire.  Fluoroscopy confirmed that it remained in the lower pole calyx.  The bladder was then emptied and the procedure ended.  The patient appeared to tolerate the procedure well and without complications.  The patient was able to be awakened and transferred to the recovery unit in satisfactory condition.    Ardis Hughs, M.D.

## 2021-07-11 NOTE — Progress Notes (Signed)
The patient was taken to the operating room this morning and a stent was placed in the right lower pole across the infected stone and into the lower pole calyx.  Bloody purulent E flux was noted initially.  I suspect the patient will now start to feel better and improve.  Would recommend 2 weeks of culture specific antibiotics.  She will need definitive management of that stone with follow-up ureteroscopy in 2 weeks.  Dr. Abner Greenspan will get her scheduled on Monday for this procedure.  I have updated the patient's daughter, who is her spokesperson.  Please contact me over the weekend for any additional questions or concerns.

## 2021-07-11 NOTE — Progress Notes (Signed)
PROGRESS NOTE    Colleen Reynolds  GNF:[id number] DOB: May 11, 1946 DOA: 07/09/2021 PCP: Billie Ruddy, MD    Brief Narrative:  HPI: Colleen Reynolds is a 76 y.o. female with medical history significant of chronic atrial fibrillation on anticoagulation, previous history of kidney stones, who presents to the hospital with right flank pain.  Reports that symptoms started just over 24 hours ago.  Discomfort started in right flank as well as right abdomen.  She progressively became nauseous and had intractable vomiting.  She was unable to keep anything down.  She felt increasingly weak and lightheaded.  She also experienced chills.  She denied any chest pain or shortness of breath.  She has not had any diarrhea.   ED Course: She was evaluated the emergency room where she wasnoted to be hypotensive and tachycardic.  EKG showed rapid atrial fibrillation.  Lactic acid noted to be elevated.  Urinalysis indicated possible infection.  CT abdomen did show concern for perinephric stranding as well as renal calculus.  She was started on IV fluids per sepsis protocol.  She was started on IV antibiotics.  Case was discussed with urology.  She was admitted for further management.   Assessment & Plan:   Principal Problem:   Pyelonephritis Active Problems:   Sepsis ([place])   Renal calculus, right   Atrial fibrillation, chronic (HCC)   Sepsis secondary to pyelonephritis and gram neg bacteremia -Noted to be hypotensive, tachycardic and tachypneic on arrival. -Started on cefepime -Blood cultures positive for E coli -Lactic acid elevated, but normalized with IV fluids -Hypotensive on admission, overall blood pressures appear to be improving with IV fluids   Right renal calculus -Seen by interventional radiology who attempted to place percutaneous nephrostomy, but was unable to do so -Subsequently seen by urology and underwent stent placement -She will need to follow-up with urology for definitive  management of stone for follow-up ureteroscopy in 2 weeks   Atrial fibrillation with rapid ventricular response -Likely precipitated by sepsis -Chronically on Coreg, held on admission due to hypotension -Resume home dose of Coreg once blood pressures have stabilized -Apixaban currently on hold due to need for possible procedure.  Last dose appears to be on 1/4 -CHADS2VASc of 2.  -can consider starting Eliquis in AM if hemoglobin and hemodynamics stable -Echocardiogram with normal ejection fraction     DVT prophylaxis: SCDs Start: 07/09/21 1723  Code Status: full code Family Communication: updated daughter over the phone Disposition Plan: Status is: Inpatient  Remains inpatient appropriate because: continued management of sepsis and obstructing renal stone    Consultants:  Urology Interventional radiology  Procedures:  1/6 attempted percutaneous nephrostomy, unsuccessful 1/7 cystoscopy with successful stent placement  Antimicrobials:  Cefepime 1/5>    Subjective: Patient is seen in her room after cystoscopy.  She denies any chest pain or shortness of breath.  No nausea at this time.  Objective: Vitals:   07/11/21 1700 07/11/21 1800 07/11/21 1900 07/11/21 1923  BP: (!) 141/67 (!) 151/68 127/73   Pulse: 84 (!) 109 (!) 101 86  Resp:   (!) 22 (!) 23  Temp:      TempSrc:      SpO2: (!) 89% 93% 98% 92%  Weight:      Height:        Intake/Output Summary (Last 24 hours) at 07/11/2021 1937 Last data filed at 07/11/2021 1654 Gross per 24 hour  Intake 3295.69 ml  Output --  Net 3295.69 ml   [employer]  07/09/21 0952  Weight: 77.1 kg    Examination:  General exam: Appears calm and comfortable  Respiratory system: Clear to auscultation. Respiratory effort normal. Cardiovascular system: S1 & S2 heard, irregular. No JVD, murmurs, rubs, gallops or clicks. No pedal edema. Gastrointestinal system: Abdomen is nondistended, soft and nontender. No organomegaly or masses  felt. Normal bowel sounds heard. Central nervous system: Alert and oriented. No focal neurological deficits. Extremities: Symmetric 5 x 5 power. Skin: No rashes, lesions or ulcers Psychiatry: Judgement and insight appear normal. Mood & affect appropriate.     Data Reviewed: I have personally reviewed following labs and imaging studies  CBC: Recent Labs  Lab 07/09/21 1000 07/10/21 0549 07/11/21 0259  WBC 4.7 12.4* 9.9  HGB 14.2 11.8* 10.6*  HCT 41.4 35.3* 32.0*  MCV 92.0 95.4 94.4  PLT 149* 126* 096*   Basic Metabolic Panel: Recent Labs  Lab 07/09/21 1000 07/10/21 0549 07/11/21 0259  NA 133* 138 135  K 3.6 3.8 3.6  CL 102 110 107  CO2 16* 20* 23  GLUCOSE 181* 112* 98  BUN 18 18 14   CREATININE 1.17* 0.85 0.81  CALCIUM 9.3 8.8* 8.6*   GFR: Estimated Creatinine Clearance: 64.8 mL/min (by C-G formula based on SCr of 0.81 mg/dL). Liver Function Tests: Recent Labs  Lab 07/09/21 1000 07/10/21 0549  AST 22 27  ALT 19 22  ALKPHOS 86 48  BILITOT 2.5* 1.7*  PROT 7.2 5.9*  ALBUMIN 3.9 2.8*   Recent Labs  Lab 07/09/21 1000  LIPASE <10*   No results for input(s): AMMONIA in the last 168 hours. Coagulation Profile: Recent Labs  Lab 07/10/21 1840  INR 1.2   Cardiac Enzymes: No results for input(s): CKTOTAL, CKMB, CKMBINDEX, TROPONINI in the last 168 hours. BNP (last 3 results) No results for input(s): PROBNP in the last 8760 hours. HbA1C: No results for input(s): HGBA1C in the last 72 hours. CBG: No results for input(s): GLUCAP in the last 168 hours. Lipid Profile: No results for input(s): CHOL, HDL, LDLCALC, TRIG, CHOLHDL, LDLDIRECT in the last 72 hours. Thyroid Function Tests: Recent Labs    07/10/21 0549  TSH 1.090   Anemia Panel: No results for input(s): VITAMINB12, FOLATE, FERRITIN, TIBC, IRON, RETICCTPCT in the last 72 hours. Sepsis Labs: Recent Labs  Lab 07/09/21 1146 07/09/21 1400 07/10/21 0549  LATICACIDVEN 3.4* 2.7* 0.8    Recent  Results (from the past 240 hour(s))  Urine Culture     Status: None   Collection Time: 07/09/21 11:43 AM   Specimen: Urine, Clean Catch  Result Value Ref Range Status   Specimen Description   Final    URINE, CLEAN CATCH Performed at [facility], [address], [place], [place] [address]    Special Requests   Final    NONE Performed at [facility], [address] [place], [place] [address]    Culture   Final    NO GROWTH Performed at [facility], [place] [address] [place], [place] [address]    Report Status 07/10/2021 FINAL  Final  Culture, blood (routine x 2)     Status: Abnormal (Preliminary result)   Collection Time: 07/09/21 11:46 AM   Specimen: Left Antecubital; Blood  Result Value Ref Range Status   Specimen Description   Final    LEFT ANTECUBITAL Performed at [facility], [address] [place], [place] [address]    Special Requests   Final    BOTTLES DRAWN AEROBIC AND ANAEROBIC  Blood Culture adequate volume Performed at [facility], [address], [place], [place] [address]    Culture  Setup Time   Final    GRAM NEGATIVE RODS IN BOTH AEROBIC AND ANAEROBIC BOTTLES CRITICAL RESULT CALLED TO, READ BACK BY AND VERIFIED WITH: M BELL,PHARMD@0716  07/10/21 [place]    Culture (A)  Final    ESCHERICHIA COLI SUSCEPTIBILITIES TO FOLLOW Performed at [facility], [place] [address] [place], [place] [address]    Report Status PENDING  Incomplete  Culture, blood (routine x 2)     Status: None (Preliminary result)   Collection Time: 07/09/21 11:46 AM   Specimen: BLOOD RIGHT HAND  Result Value Ref Range Status   Specimen Description   Final    BLOOD RIGHT HAND Performed at [facility], [address] [place], [place] [address]    Special Requests   Final    BOTTLES DRAWN AEROBIC AND ANAEROBIC Blood Culture results may not be optimal due to an excessive volume of  blood received in culture bottles Performed at [facility], [address] [place], [place] [address]    Culture  Setup Time   Final    GRAM NEGATIVE RODS IN BOTH AEROBIC AND ANAEROBIC BOTTLES CRITICAL VALUE NOTED.  VALUE IS CONSISTENT WITH PREVIOUSLY REPORTED AND CALLED VALUE.    Culture   Final    GRAM NEGATIVE RODS IDENTIFICATION TO FOLLOW Performed at [facility], [place] [address], [place], [place] [address]    Report Status PENDING  Incomplete  Blood Culture ID Panel (Reflexed)     Status: Abnormal   Collection Time: 07/09/21 11:46 AM  Result Value Ref Range Status   Enterococcus faecalis NOT DETECTED NOT DETECTED Final   Enterococcus Faecium NOT DETECTED NOT DETECTED Final   Listeria monocytogenes NOT DETECTED NOT DETECTED Final   Staphylococcus species NOT DETECTED NOT DETECTED Final   Staphylococcus aureus (BCID) NOT DETECTED NOT DETECTED Final   Staphylococcus epidermidis NOT DETECTED NOT DETECTED Final   Staphylococcus lugdunensis NOT DETECTED NOT DETECTED Final   Streptococcus species NOT DETECTED NOT DETECTED Final   Streptococcus agalactiae NOT DETECTED NOT DETECTED Final   Streptococcus pneumoniae NOT DETECTED NOT DETECTED Final   Streptococcus pyogenes NOT DETECTED NOT DETECTED Final   A.calcoaceticus-baumannii NOT DETECTED NOT DETECTED Final   Bacteroides fragilis NOT DETECTED NOT DETECTED Final   Enterobacterales DETECTED (A) NOT DETECTED Final    Comment: Enterobacterales represent a large order of gram negative bacteria, not a single organism. CRITICAL RESULT CALLED TO, READ BACK BY AND VERIFIED WITH: M BELL,PHARMD@0715  07/10/21 [place]    Enterobacter cloacae complex NOT DETECTED NOT DETECTED Final   Escherichia coli DETECTED (A) NOT DETECTED Final    Comment: CRITICAL RESULT CALLED TO, READ BACK BY AND VERIFIED WITH: M BELL,PHARMD@0715  07/10/21 [place]    Klebsiella aerogenes NOT DETECTED NOT DETECTED Final   Klebsiella oxytoca NOT  DETECTED NOT DETECTED Final   Klebsiella pneumoniae NOT DETECTED NOT DETECTED Final   Proteus species NOT DETECTED NOT DETECTED Final   Salmonella species NOT DETECTED NOT DETECTED Final   Serratia marcescens NOT DETECTED NOT DETECTED Final   Haemophilus influenzae NOT DETECTED NOT DETECTED Final   Neisseria meningitidis NOT DETECTED NOT DETECTED Final   Pseudomonas aeruginosa NOT DETECTED NOT DETECTED Final   Stenotrophomonas maltophilia NOT DETECTED NOT DETECTED Final   Candida albicans NOT DETECTED NOT DETECTED Final   Candida auris NOT DETECTED NOT DETECTED Final   Candida glabrata NOT DETECTED  NOT DETECTED Final   Candida krusei NOT DETECTED NOT DETECTED Final   Candida parapsilosis NOT DETECTED NOT DETECTED Final   Candida tropicalis NOT DETECTED NOT DETECTED Final   Cryptococcus neoformans/gattii NOT DETECTED NOT DETECTED Final   CTX-M ESBL NOT DETECTED NOT DETECTED Final   Carbapenem resistance IMP NOT DETECTED NOT DETECTED Final   Carbapenem resistance KPC NOT DETECTED NOT DETECTED Final   Carbapenem resistance NDM NOT DETECTED NOT DETECTED Final   Carbapenem resist OXA 48 LIKE NOT DETECTED NOT DETECTED Final   Carbapenem resistance VIM NOT DETECTED NOT DETECTED Final    Comment: Performed at [facility], [place] [address], [place], [place] [address]  Resp Panel by RT-PCR (Flu A&B, Covid) Nasopharyngeal Swab     Status: None   Collection Time: 07/09/21  3:20 PM   Specimen: Nasopharyngeal Swab; Nasopharyngeal(NP) swabs in vial transport medium  Result Value Ref Range Status   SARS Coronavirus 2 by RT PCR NEGATIVE NEGATIVE Final    Comment: (NOTE) SARS-CoV-2 target nucleic acids are NOT DETECTED.  The SARS-CoV-2 RNA is generally detectable in upper respiratory specimens during the acute phase of infection. The lowest concentration of SARS-CoV-2 viral copies this assay can detect is 138 copies/mL. A negative result does not preclude SARS-Cov-2 infection and should not be  used as the sole basis for treatment or other patient management decisions. A negative result may occur with  improper specimen collection/handling, submission of specimen other than nasopharyngeal swab, presence of viral mutation(s) within the areas targeted by this assay, and inadequate number of viral copies(<138 copies/mL). A negative result must be combined with clinical observations, patient history, and epidemiological information. The expected result is Negative.  Fact Sheet for Patients:  EntrepreneurPulse.com.au  Fact Sheet for Healthcare Providers:  IncredibleEmployment.be  This test is no t yet approved or cleared by the [place] FDA and  has been authorized for detection and/or diagnosis of SARS-CoV-2 by FDA under an Emergency Use Authorization (EUA). This EUA will remain  in effect (meaning this test can be used) for the duration of the COVID-19 declaration under Section 564(b)(1) of the Act, 21 U.S.C.section 360bbb-3(b)(1), unless the authorization is terminated  or revoked sooner.       Influenza A by PCR NEGATIVE NEGATIVE Final   Influenza B by PCR NEGATIVE NEGATIVE Final    Comment: (NOTE) The Xpert Xpress SARS-CoV-2/FLU/RSV plus assay is intended as an aid in the diagnosis of influenza from Nasopharyngeal swab specimens and should not be used as a sole basis for treatment. Nasal washings and aspirates are unacceptable for Xpert Xpress SARS-CoV-2/FLU/RSV testing.  Fact Sheet for Patients: EntrepreneurPulse.com.au  Fact Sheet for Healthcare Providers: IncredibleEmployment.be  This test is not yet approved or cleared by the [place] FDA and has been authorized for detection and/or diagnosis of SARS-CoV-2 by FDA under an Emergency Use Authorization (EUA). This EUA will remain in effect (meaning this test can be used) for the duration of the COVID-19 declaration under Section  564(b)(1) of the Act, 21 U.S.C. section 360bbb-3(b)(1), unless the authorization is terminated or revoked.  Performed at [facility], [address], [place], [place] [address]   MRSA Next Gen by PCR, Nasal     Status: None   Collection Time: 07/09/21  5:34 PM   Specimen: Nasal Mucosa; Nasal Swab  Result Value Ref Range Status   MRSA by PCR Next Gen NOT DETECTED NOT DETECTED Final    Comment: (NOTE) The GeneXpert MRSA Assay (FDA approved  for NASAL specimens only), is one component of a comprehensive MRSA colonization surveillance program. It is not intended to diagnose MRSA infection nor to guide or monitor treatment for MRSA infections. Test performance is not FDA approved in patients less than 59 years old. Performed at [facility], [place] [address] [place], [place] [address]          Radiology Studies: DG C-Arm 1-60 Min-No Report  Result Date: 07/11/2021 Fluoroscopy was utilized by the requesting physician.  No radiographic interpretation.   ECHOCARDIOGRAM COMPLETE  Result Date: 07/11/2021    ECHOCARDIOGRAM REPORT   Patient Name:   Colleen Reynolds Date of Exam: 07/11/2021 Medical Rec #:  [id number]          Height:       66.0 in Accession #:    [id number]         Weight:       170.0 lb Date of Birth:  1946/06/13          BSA:          1.866 m Patient Age:    62 years           BP:           123/44 mmHg Patient Gender: F                  HR:           79 bpm. Exam Location:  Inpatient Procedure: 2D Echo, Cardiac Doppler and Color Doppler Indications:    Afib  History:        Patient has no prior history of Echocardiogram examinations.  Sonographer:    Glo Herring Referring Phys: [place]  1. Left ventricular ejection fraction, by estimation, is 55 to 60%. The left ventricle has normal function. The left ventricle has no regional wall motion abnormalities. There is mild left ventricular hypertrophy. Left ventricular  diastolic parameters were normal.  2. Right ventricular systolic function is normal. The right ventricular size is normal.  3. The mitral valve is normal in structure. Mild mitral valve regurgitation.  4. The aortic valve is tricuspid. Aortic valve regurgitation is not visualized. Aortic valve sclerosis is present, with no evidence of aortic valve stenosis.  5. The inferior vena cava is dilated in size with <50% respiratory variability, suggesting right atrial pressure of 15 mmHg. FINDINGS  Left Ventricle: Left ventricular ejection fraction, by estimation, is 55 to 60%. The left ventricle has normal function. The left ventricle has no regional wall motion abnormalities. The left ventricular internal cavity size was normal in size. There is  mild left ventricular hypertrophy. Left ventricular diastolic parameters were normal. Right Ventricle: The right ventricular size is normal. Right vetricular wall thickness was not assessed. Right ventricular systolic function is normal. Left Atrium: Left atrial size was normal in size. Right Atrium: Right atrial size was normal in size. Pericardium: There is no evidence of pericardial effusion. Mitral Valve: The mitral valve is normal in structure. Mild mitral valve regurgitation. Tricuspid Valve: The tricuspid valve is normal in structure. Tricuspid valve regurgitation is mild. Aortic Valve: The aortic valve is tricuspid. Aortic valve regurgitation is not visualized. Aortic valve sclerosis is present, with no evidence of aortic valve stenosis. Aortic valve mean gradient measures 4.0 mmHg. Aortic valve peak gradient measures 6.2  mmHg. Aortic valve area, by VTI measures 2.03 cm. Pulmonic Valve: The pulmonic valve was normal in structure. Pulmonic valve regurgitation is mild. Aorta: The aortic  root and ascending aorta are structurally normal, with no evidence of dilitation. Venous: The inferior vena cava is dilated in size with less than 50% respiratory variability, suggesting  right atrial pressure of 15 mmHg. IAS/Shunts: The interatrial septum was not assessed.  LEFT VENTRICLE PLAX 2D LVIDd:         4.20 cm   Diastology LVIDs:         2.90 cm   LV e' medial:    6.96 cm/s LV PW:         0.90 cm   LV E/e' medial:  13.0 LV IVS:        1.20 cm   LV e' lateral:   7.07 cm/s LVOT diam:     2.00 cm   LV E/e' lateral: 12.8 LV SV:         62 LV SV Index:   33 LVOT Area:     3.14 cm  RIGHT VENTRICLE             IVC RV Basal diam:  3.20 cm     IVC diam: 2.30 cm RV S prime:     10.30 cm/s LEFT ATRIUM             Index        RIGHT ATRIUM           Index LA diam:        4.00 cm 2.14 cm/m   RA Area:     17.10 cm LA Vol (A2C):   41.1 ml 22.02 ml/m  RA Volume:   39.80 ml  21.33 ml/m LA Vol (A4C):   55.6 ml 29.79 ml/m LA Biplane Vol: 47.8 ml 25.61 ml/m  AORTIC VALVE                    PULMONIC VALVE AV Area (Vmax):    2.31 cm     PV Vmax:       0.78 m/s AV Area (Vmean):   2.16 cm     PV Peak grad:  2.4 mmHg AV Area (VTI):     2.03 cm AV Vmax:           125.00 cm/s AV Vmean:          90.600 cm/s AV VTI:            0.305 m AV Peak Grad:      6.2 mmHg AV Mean Grad:      4.0 mmHg LVOT Vmax:         92.00 cm/s LVOT Vmean:        62.200 cm/s LVOT VTI:          0.197 m LVOT/AV VTI ratio: 0.65  AORTA Ao Root diam: 2.80 cm Ao Asc diam:  3.30 cm MITRAL VALVE MV Area (PHT): 4.06 cm    SHUNTS MV Decel Time: 187 msec    Systemic VTI:  0.20 m MV E velocity: 90.80 cm/s  Systemic Diam: 2.00 cm MV A velocity: 78.00 cm/s MV E/A ratio:  1.16 Dorris Carnes MD Electronically signed by Dorris Carnes MD Signature Date/Time: 07/11/2021/12:59:12 PM    Final    IR NEPHROSTOMY PLACEMENT RIGHT  Result Date: 07/10/2021 INDICATION: 76 year old female presents to the hospital with urosepsis secondary 2 a stone in the lower pole collecting system infundibulum, with local pelvicaliectasis of the lower pole. She has been referred for attempt at PCN. EXAM: IMAGE GUIDED PERCUTANEOUS NEPHROSTOMY COMPARISON:  CT 07/09/2021 MEDICATIONS:  Ciprofloxacin 400 mg  IV; The antibiotic was administered in an appropriate time frame prior to skin puncture. ANESTHESIA/SEDATION: Moderate (conscious) sedation was employed during this procedure. A total of Versed 3.5 mg and Fentanyl 200 mcg was administered intravenously by the radiology nurse. Total intra-service moderate Sedation Time: 98 minutes. The patient's level of consciousness and vital signs were monitored continuously by radiology nursing throughout the procedure under my direct supervision. CONTRAST:  80 cc-administered into the collecting system(s) FLUOROSCOPY TIME:  Fluoroscopy Time: 41 minutes 24 seconds (599 mGy). COMPLICATIONS: SIR LEVEL B - Normal therapy, includes overnight admission for observation. Intravasation of contrast at the conclusion of the case, with bacteremia/rigors PROCEDURE: Informed written consent was obtained from the patient after a thorough discussion of the procedural risks, benefits and alternatives. All questions were addressed. Maximal Sterile Barrier Technique was utilized including caps, mask, sterile gowns, sterile gloves, sterile drape, hand hygiene and skin antiseptic. A timeout was performed prior to the initiation of the procedure. Patient positioned prone position on the fluoroscopy table. Ultrasound survey of the right flank was performed with images stored and sent to PACs. The patient was then prepped and draped in the usual sterile fashion. 1% lidocaine was used to anesthetize the skin and subcutaneous tissues for local anesthesia. An Envy/chiba needle was then used in attempt to access the mildly dilated posterior inferior calyx with ultrasound guidance. After 3 attempts, we elected to forego ultrasound. A second needle puncture was then performed, targeting the infundibulum stone with a second needle from an AP approach. 1% lidocaine was used for local anesthesia, and the needle was then used to puncture the infundibulum at the site of the stone. Contrast  opacification was performed. The second needle was then used in attempt to access the opacified lower pole and most posterior calyx. On the initial needle puncture, the needle tip was within the lower pole posterior calyx, however, the initial angulation was not favorable for passing the wire. A second more inferior needle approach was then performed, for a favorable angle into the calyx. On this second needle puncture the wire was passed into the lower pole collecting system calyx, though would not traverse the region the impacted stone. The Envy tri-axial system was then advanced over the wire into the collecting system under fluoroscopy. The metal stiffener and inner dilator were removed, and contrast was injected. Contrast was clearly not within the collecting system/lower pole calyx on this passage of the triaxial system. The 4 French sheath was removed. At this point, there was not a needle within the collecting system, and another puncture of the stone was attempted. From here forward, all of the contrast was injected demonstrated some element of venous intravasation. The same needle trajectory from a caudal right posterior oblique approach was performed into the collapsed collecting system. Multiple needle punctures were performed, with only partial opacification. Ultimately the end the triaxial system was again past, which was only partially within the collapse collecting system. Multiple attempts of passing a 018 wire was performed, ultimately not within the lower pole calyx. The patient began to experience rigors after approximately 1 hour and 30 minutes of working time, 41 minutes of fluoroscopy time. We aborted given that there was a clear venous communication with the disrupted lower pole calyx. 30 cc estimated blood loss. IMPRESSION: Status post image guided attempt at a percutaneous nephrostomy of the isolated obstruction of the right lower pole calyx secondary to impacted infundibular stone.  Ultimately, we were unable to place the catheter in the small space.  Signed, Dulcy Fanny. Dellia Nims, RPVI [facility] Radiology Electronically Signed   By: Corrie Mckusick D.O.   On: 07/10/2021 18:03   VAS [place] LOWER EXTREMITY VENOUS (DVT)  Result Date: 07/10/2021  Lower Venous DVT Study Patient Name:  Colleen Reynolds  Date of Exam:   07/10/2021 Medical Rec #: [id number]           Accession #:    [id number] Date of Birth: 21-Sep-1945           Patient Gender: F Patient Age:   16 years Exam Location:  [facility] Procedure:      VAS [place] LOWER EXTREMITY VENOUS (DVT) Referring Phys: Jolaine Artist Nelle Sayed --------------------------------------------------------------------------------  Indications: Pain.  Risk Factors: None identified. Comparison Study: No prior studies. Performing Technologist: Oliver Hum RVT  Examination Guidelines: A complete evaluation includes B-mode imaging, spectral Doppler, color Doppler, and power Doppler as needed of all accessible portions of each vessel. Bilateral testing is considered an integral part of a complete examination. Limited examinations for reoccurring indications may be performed as noted. The reflux portion of the exam is performed with the patient in reverse Trendelenburg.  +---------+---------------+---------+-----------+----------+--------------+  RIGHT     Compressibility Phasicity Spontaneity Properties Thrombus Aging  +---------+---------------+---------+-----------+----------+--------------+  CFV       Full            Yes       Yes                                    +---------+---------------+---------+-----------+----------+--------------+  SFJ       Full                                                             +---------+---------------+---------+-----------+----------+--------------+  FV Prox   Full                                                              +---------+---------------+---------+-----------+----------+--------------+  FV Mid    Full                                                             +---------+---------------+---------+-----------+----------+--------------+  FV Distal Full                                                             +---------+---------------+---------+-----------+----------+--------------+  PFV       Full                                                             +---------+---------------+---------+-----------+----------+--------------+  POP       Full            Yes       Yes                                    +---------+---------------+---------+-----------+----------+--------------+  PTV       Full                                                             +---------+---------------+---------+-----------+----------+--------------+  PERO      Full                                                             +---------+---------------+---------+-----------+----------+--------------+   +----+---------------+---------+-----------+----------+--------------+  LEFT Compressibility Phasicity Spontaneity Properties Thrombus Aging  +----+---------------+---------+-----------+----------+--------------+  CFV  Full            Yes       Yes                                    +----+---------------+---------+-----------+----------+--------------+     Summary: RIGHT: - There is no evidence of deep vein thrombosis in the lower extremity.  - No cystic structure found in the popliteal fossa.  LEFT: - No evidence of common femoral vein obstruction.  *See table(s) above for measurements and observations. Electronically signed by Orlie Pollen on 07/10/2021 at 6:58:08 PM.    Final         Scheduled Meds:  Chlorhexidine Gluconate Cloth  6 each Topical Q0600   famotidine  20 mg Oral Daily   FLUoxetine  40 mg Oral Daily   valACYclovir  1,000 mg Oral Daily   Continuous Infusions:  sodium chloride Stopped (07/10/21 0945)   ceFEPime  (MAXIPIME) IV Stopped (07/11/21 1530)   lactated ringers       LOS: 2 days    Time spent: 35 mins    Kathie Dike, MD [facility]   If 7PM-7AM, please contact night-coverage [email or web address]  07/11/2021, 7:37 PM

## 2021-07-11 NOTE — Anesthesia Preprocedure Evaluation (Signed)
Anesthesia Evaluation  Patient identified by MRN, date of birth, ID band Patient awake    Reviewed: Allergy & Precautions, H&P , NPO status , Patient's Chart, lab work & pertinent test results  Airway Mallampati: II   Neck ROM: full    Dental   Pulmonary neg pulmonary ROS,    breath sounds clear to auscultation       Cardiovascular negative cardio ROS   Rhythm:regular Rate:Normal     Neuro/Psych PSYCHIATRIC DISORDERS Depression    GI/Hepatic   Endo/Other    Renal/GU Renal diseaseRight ureteral stone     Musculoskeletal  (+) Arthritis ,   Abdominal   Peds  Hematology   Anesthesia Other Findings   Reproductive/Obstetrics                             Anesthesia Physical Anesthesia Plan  ASA: 2  Anesthesia Plan: General   Post-op Pain Management:    Induction: Intravenous  PONV Risk Score and Plan: 3 and Ondansetron, Dexamethasone and Treatment may vary due to age or medical condition  Airway Management Planned: LMA  Additional Equipment:   Intra-op Plan:   Post-operative Plan: Extubation in OR  Informed Consent: I have reviewed the patients History and Physical, chart, labs and discussed the procedure including the risks, benefits and alternatives for the proposed anesthesia with the patient or authorized representative who has indicated his/her understanding and acceptance.     Dental advisory given  Plan Discussed with: CRNA, Anesthesiologist and Surgeon  Anesthesia Plan Comments:         Anesthesia Quick Evaluation

## 2021-07-11 NOTE — Interval H&P Note (Signed)
History and Physical Interval Note:  07/11/2021 7:31 AM  Colleen Reynolds  has presented today for surgery, with the diagnosis of RIGHT URTERAL STONE.  The various methods of treatment have been discussed with the patient and family. After consideration of risks, benefits and other options for treatment, the patient has consented to  Procedure(s): CYSTOSCOPY WITH RETROGRADE PYELOGRAM/URETERAL STENT PLACEMENT (Right) as a surgical intervention.  The patient's history has been reviewed, patient examined, no change in status, stable for surgery.  I have reviewed the patient's chart and labs.  Questions were answered to the patient's satisfaction.     Ardis Hughs

## 2021-07-11 NOTE — Anesthesia Procedure Notes (Signed)
Procedure Name: LMA Insertion Date/Time: 07/11/2021 7:42 AM Performed by: Raenette Rover, CRNA Pre-anesthesia Checklist: Patient identified, Emergency Drugs available, Suction available and Patient being monitored Patient Re-evaluated:Patient Re-evaluated prior to induction Oxygen Delivery Method: Circle system utilized Preoxygenation: Pre-oxygenation with 100% oxygen Induction Type: IV induction Ventilation: Mask ventilation without difficulty LMA: LMA inserted LMA Size: 4.0 Number of attempts: 1 Placement Confirmation: positive ETCO2 and breath sounds checked- equal and bilateral Tube secured with: Tape Dental Injury: Teeth and Oropharynx as per pre-operative assessment

## 2021-07-11 NOTE — Transfer of Care (Signed)
Immediate Anesthesia Transfer of Care Note  Patient: Colleen Reynolds  Procedure(s) Performed: CYSTOSCOPY WITH RETROGRADE PYELOGRAM/URETERAL STENT PLACEMENT (Right: Ureter)  Patient Location: PACU  Anesthesia Type:General  Level of Consciousness: awake, alert , oriented, drowsy and patient cooperative  Airway & Oxygen Therapy: Patient Spontanous Breathing and Patient connected to nasal cannula oxygen  Post-op Assessment: Report given to RN and Post -op Vital signs reviewed and stable  Post vital signs: Reviewed and stable  Last Vitals:  Vitals Value Taken Time  BP 129/92 07/11/21 0845  Temp    Pulse 93 07/11/21 0845  Resp 25 07/11/21 0845  SpO2 95 % 07/11/21 0845  Vitals shown include unvalidated device data.  Last Pain:  Vitals:   07/11/21 0400  TempSrc:   PainSc: Asleep      Patients Stated Pain Goal: 3 (11/06/[birth year] 8893)  Complications: No notable events documented.

## 2021-07-12 ENCOUNTER — Encounter (HOSPITAL_COMMUNITY): Payer: Self-pay | Admitting: Urology

## 2021-07-12 LAB — CULTURE, BLOOD (ROUTINE X 2): Special Requests: ADEQUATE

## 2021-07-12 LAB — CBC
HCT: 33.5 % — ABNORMAL LOW (ref 36.0–46.0)
Hemoglobin: 11.2 g/dL — ABNORMAL LOW (ref 12.0–15.0)
MCH: 31.7 pg (ref 26.0–34.0)
MCHC: 33.4 g/dL (ref 30.0–36.0)
MCV: 94.9 fL (ref 80.0–100.0)
Platelets: 139 10*3/uL — ABNORMAL LOW (ref 150–400)
RBC: 3.53 MIL/uL — ABNORMAL LOW (ref 3.87–5.11)
RDW: 13.2 % (ref 11.5–15.5)
WBC: 9 10*3/uL (ref 4.0–10.5)
nRBC: 0 % (ref 0.0–0.2)

## 2021-07-12 MED ORDER — CARVEDILOL 3.125 MG PO TABS
3.1250 mg | ORAL_TABLET | Freq: Two times a day (BID) | ORAL | Status: DC
  Administered 2021-07-12 – 2021-07-13 (×3): 3.125 mg via ORAL
  Filled 2021-07-12 (×3): qty 1

## 2021-07-12 MED ORDER — CEFADROXIL 500 MG PO CAPS
1000.0000 mg | ORAL_CAPSULE | Freq: Two times a day (BID) | ORAL | Status: DC
  Administered 2021-07-12 – 2021-07-13 (×2): 1000 mg via ORAL
  Filled 2021-07-12 (×3): qty 2

## 2021-07-12 MED ORDER — TAMSULOSIN HCL 0.4 MG PO CAPS
0.4000 mg | ORAL_CAPSULE | Freq: Every day | ORAL | Status: DC
  Administered 2021-07-12 – 2021-07-13 (×2): 0.4 mg via ORAL
  Filled 2021-07-12 (×2): qty 1

## 2021-07-12 MED ORDER — CEFADROXIL 500 MG PO CAPS
500.0000 mg | ORAL_CAPSULE | Freq: Two times a day (BID) | ORAL | Status: DC
  Administered 2021-07-12: 500 mg via ORAL
  Filled 2021-07-12: qty 1

## 2021-07-12 MED ORDER — APIXABAN 5 MG PO TABS
5.0000 mg | ORAL_TABLET | Freq: Two times a day (BID) | ORAL | Status: DC
  Administered 2021-07-12 – 2021-07-13 (×3): 5 mg via ORAL
  Filled 2021-07-12 (×3): qty 1

## 2021-07-12 NOTE — Progress Notes (Signed)
°   07/12/21 1815  Assess: MEWS Score  Temp 98.7 F (37.1 C)  BP 127/87  Pulse Rate (!) 114  Assess: MEWS Score  MEWS Temp 0  MEWS Systolic 0  MEWS Pulse 2  MEWS RR 0  MEWS LOC 0  MEWS Score 2  MEWS Score Color Yellow  Assess: if the MEWS score is Yellow or Red  Were vital signs taken at a resting state? Yes  Focused Assessment No change from prior assessment  Does the patient meet 2 or more of the SIRS criteria? No  MEWS guidelines implemented *See Row Information* Yes  Treat  Pain Score 0  Escalate  MEWS: Escalate Yellow: discuss with charge nurse/RN and consider discussing with provider and RRT  Notify: Charge Nurse/RN  Name of Charge Nurse/RN Notified Jenny Reichmann H  Date Charge Nurse/RN Notified 07/12/21  Time Charge Nurse/RN Notified 1825  Notify: Provider  Provider Name/Title Dr. Roderic Palau  Date Provider Notified 07/12/21  Time Provider Notified [phone]  Notification Type Call  Notification Reason Other (Comment) (yellow mews-pulse and respirations)  Provider response No new orders  Date of Provider Response 07/12/21  Time of Provider Response 1835  Document  Patient Outcome Other (Comment) (will continue to monitor her vitals)  Progress note created (see row info) Yes  Assess: SIRS CRITERIA  SIRS Temperature  0  SIRS Pulse 1  SIRS Respirations  0  SIRS WBC 0  SIRS Score Sum  1

## 2021-07-12 NOTE — Progress Notes (Signed)
Urology Inpatient Progress Report  Kidney stone [N20.0] Pyelonephritis [N12] Acute pyelonephritis [N10] Sepsis, due to unspecified organism, unspecified whether acute organ dysfunction present ([place]) [A41.9]  Procedure(s): CYSTOSCOPY WITH RETROGRADE PYELOGRAM/URETERAL STENT PLACEMENT  1 Day Post-Op   Intv/Subj: No acute events overnight.  The patient was afebrile with stable hemodynamics Patient is without complaint.  She did complain of some right ureteral discomfort when standing. She is voiding on her own without difficulty.  Principal Problem:   Pyelonephritis Active Problems:   Sepsis ([place])   Renal calculus, right   Atrial fibrillation, chronic (HCC)  Current Facility-Administered Medications  Medication Dose Route Frequency Provider Last Rate Last Admin   0.9 %  sodium chloride infusion   Intravenous PRN Kathie Dike, MD   Stopped at 07/10/21 0945   acetaminophen (TYLENOL) tablet 650 mg  650 mg Oral Q6H PRN Kathie Dike, MD   650 mg at 07/12/21 0820   Or   acetaminophen (TYLENOL) suppository 650 mg  650 mg Rectal Q6H PRN Kathie Dike, MD       apixaban (ELIQUIS) tablet 5 mg  5 mg Oral BID Kathie Dike, MD   5 mg at 07/12/21 1221   carvedilol (COREG) tablet 3.125 mg  3.125 mg Oral BID WC Kathie Dike, MD       cefadroxil (DURICEF) capsule 500 mg  500 mg Oral BID Kathie Dike, MD   500 mg at 07/12/21 1221   Chlorhexidine Gluconate Cloth 2 % PADS 6 each  6 each Topical Q0600 Kathie Dike, MD   6 each at 07/11/21 0630   famotidine (PEPCID) tablet 20 mg  20 mg Oral Daily Kathie Dike, MD   20 mg at 07/12/21 1046   FLUoxetine (PROZAC) capsule 40 mg  40 mg Oral Daily Kathie Dike, MD   40 mg at 07/12/21 1046   morphine 2 MG/ML injection 2 mg  2 mg Intravenous Q2H PRN Kathie Dike, MD   2 mg at 07/11/21 1511   ondansetron (ZOFRAN) tablet 4 mg  4 mg Oral Q6H PRN Kathie Dike, MD       Or   ondansetron (ZOFRAN) injection 4 mg  4 mg Intravenous Q6H  PRN Kathie Dike, MD   4 mg at 07/10/21 0130   valACYclovir (VALTREX) tablet 1,000 mg  1,000 mg Oral Daily Kathie Dike, MD   1,000 mg at 07/12/21 1046     Objective: Vital: Vitals:   07/12/21 0800 07/12/21 0900 07/12/21 1000 07/12/21 1213  BP: (!) 150/85  125/62   Pulse: 98 88 (!) 118   Resp: 17 (!) 21 (!) 26   Temp: 97.9 F (36.6 C)   98.8 F (37.1 C)  TempSrc: Oral   Oral  SpO2: 96% 97% 92%   Weight:      Height:       I/Os: I/O last 3 completed shifts: In: 3735.8 [P.O.:250; I.V.:2997.4; IV Piggyback:488.4] Out: -   Physical Exam:  General: Patient is in no apparent distress Lungs: Normal respiratory effort, chest expands symmetrically. GI: The abdomen is soft and nontender without mass. Ext: lower extremities symmetric  Lab Results: Recent Labs    07/10/21 0549 07/11/21 0259 07/12/21 0255  WBC 12.4* 9.9 9.0  HGB 11.8* 10.6* 11.2*  HCT 35.3* 32.0* 33.5*   Recent Labs    07/10/21 0549 07/11/21 0259  NA 138 135  K 3.8 3.6  CL 110 107  CO2 20* 23  GLUCOSE 112* 98  BUN 18 14  CREATININE 0.85 0.81  CALCIUM 8.8*  8.6*   Recent Labs    07/10/21 1840  INR 1.2   No results for input(s): LABURIN in the last 72 hours. Results for orders placed or performed during the hospital encounter of 07/09/21  Urine Culture     Status: None   Collection Time: 07/09/21 11:43 AM   Specimen: Urine, Clean Catch  Result Value Ref Range Status   Specimen Description   Final    URINE, CLEAN CATCH Performed at [facility], [address] [place], [place] [address]    Special Requests   Final    NONE Performed at [facility], [address], [place], [place] [address]    Culture   Final    NO GROWTH Performed at [facility], [place] [address], [place], [place] [address]    Report Status 07/10/2021 FINAL  Final  Culture, blood (routine x 2)     Status: Abnormal   Collection Time: 07/09/21 11:46 AM   Specimen: Left  Antecubital; Blood  Result Value Ref Range Status   Specimen Description   Final    LEFT ANTECUBITAL Performed at [facility], [address], [place], [place] [address]    Special Requests   Final    BOTTLES DRAWN AEROBIC AND ANAEROBIC Blood Culture adequate volume Performed at [facility], [place], [place] [address]    Culture  Setup Time   Final    GRAM NEGATIVE RODS IN BOTH AEROBIC AND ANAEROBIC BOTTLES CRITICAL RESULT CALLED TO, READ BACK BY AND VERIFIED WITH: M BELL,PHARMD@[id number]  07/10/21 [place] Performed at [facility], [place] [address] [place], [place]    Culture ESCHERICHIA COLI (A)  Final   Report Status 07/12/2021 FINAL  Final   Organism ID, Bacteria ESCHERICHIA COLI  Final      Susceptibility   Escherichia coli - MIC*    AMPICILLIN <=2 SENSITIVE Sensitive     CEFAZOLIN <=4 SENSITIVE Sensitive     CEFEPIME <=0.12 SENSITIVE Sensitive     CEFTAZIDIME <=1 SENSITIVE Sensitive     CEFTRIAXONE <=0.25 SENSITIVE Sensitive     CIPROFLOXACIN <=0.25 SENSITIVE Sensitive     GENTAMICIN <=1 SENSITIVE Sensitive     IMIPENEM <=0.25 SENSITIVE Sensitive     TRIMETH/SULFA <=20 SENSITIVE Sensitive     AMPICILLIN/SULBACTAM <=2 SENSITIVE Sensitive     PIP/TAZO <=4 SENSITIVE Sensitive     * ESCHERICHIA COLI  Culture, blood (routine x 2)     Status: Abnormal   Collection Time: 07/09/21 11:46 AM   Specimen: BLOOD RIGHT HAND  Result Value Ref Range Status   Specimen Description   Final    BLOOD RIGHT HAND Performed at [facility], [address], [place], [place]    Special Requests   Final    BOTTLES DRAWN AEROBIC AND ANAEROBIC Blood Culture results may not be optimal due to an excessive volume of blood received in culture bottles Performed at [facility], [address], [place], [place]    Culture  Setup Time   Final    GRAM NEGATIVE RODS IN BOTH AEROBIC  AND ANAEROBIC BOTTLES CRITICAL VALUE NOTED.  VALUE IS CONSISTENT WITH PREVIOUSLY REPORTED AND CALLED VALUE.    Culture (A)  Final    ESCHERICHIA COLI SUSCEPTIBILITIES PERFORMED ON PREVIOUS CULTURE WITHIN THE LAST 5 DAYS. Performed at [facility], [place] [address] [place], [place]    Report Status 07/12/2021 FINAL  Final  Blood Culture ID Panel (Reflexed)     Status: Abnormal   Collection Time: 07/09/21 11:46 AM  Result Value Ref Range Status   Enterococcus faecalis NOT DETECTED NOT DETECTED Final   Enterococcus Faecium NOT DETECTED NOT DETECTED Final   Listeria monocytogenes NOT DETECTED NOT DETECTED Final   Staphylococcus species NOT DETECTED NOT DETECTED Final   Staphylococcus aureus (BCID) NOT DETECTED NOT DETECTED Final   Staphylococcus epidermidis NOT DETECTED NOT DETECTED Final   Staphylococcus lugdunensis NOT DETECTED NOT DETECTED Final   Streptococcus species NOT DETECTED NOT DETECTED Final   Streptococcus agalactiae NOT DETECTED NOT DETECTED Final   Streptococcus pneumoniae NOT DETECTED NOT DETECTED Final   Streptococcus pyogenes NOT DETECTED NOT DETECTED Final   A.calcoaceticus-baumannii NOT DETECTED NOT DETECTED Final   Bacteroides fragilis NOT DETECTED NOT DETECTED Final   Enterobacterales DETECTED (A) NOT DETECTED Final    Comment: Enterobacterales represent a large order of gram negative bacteria, not a single organism. CRITICAL RESULT CALLED TO, READ BACK BY AND VERIFIED WITH: M BELL,PHARMD@0715  07/10/21 [place]    Enterobacter cloacae complex NOT DETECTED NOT DETECTED Final   Escherichia coli DETECTED (A) NOT DETECTED Final    Comment: CRITICAL RESULT CALLED TO, READ BACK BY AND VERIFIED WITH: M BELL,PHARMD@0715  07/10/21 [place]    Klebsiella aerogenes NOT DETECTED NOT DETECTED Final   Klebsiella oxytoca NOT DETECTED NOT DETECTED Final   Klebsiella pneumoniae NOT DETECTED NOT DETECTED Final   Proteus species NOT DETECTED NOT DETECTED Final   Salmonella  species NOT DETECTED NOT DETECTED Final   Serratia marcescens NOT DETECTED NOT DETECTED Final   Haemophilus influenzae NOT DETECTED NOT DETECTED Final   Neisseria meningitidis NOT DETECTED NOT DETECTED Final   Pseudomonas aeruginosa NOT DETECTED NOT DETECTED Final   Stenotrophomonas maltophilia NOT DETECTED NOT DETECTED Final   Candida albicans NOT DETECTED NOT DETECTED Final   Candida auris NOT DETECTED NOT DETECTED Final   Candida glabrata NOT DETECTED NOT DETECTED Final   Candida krusei NOT DETECTED NOT DETECTED Final   Candida parapsilosis NOT DETECTED NOT DETECTED Final   Candida tropicalis NOT DETECTED NOT DETECTED Final   Cryptococcus neoformans/gattii NOT DETECTED NOT DETECTED Final   CTX-M ESBL NOT DETECTED NOT DETECTED Final   Carbapenem resistance IMP NOT DETECTED NOT DETECTED Final   Carbapenem resistance KPC NOT DETECTED NOT DETECTED Final   Carbapenem resistance NDM NOT DETECTED NOT DETECTED Final   Carbapenem resist OXA 48 LIKE NOT DETECTED NOT DETECTED Final   Carbapenem resistance VIM NOT DETECTED NOT DETECTED Final    Comment: Performed at [facility], [address], [place], [place] [address]  Resp Panel by RT-PCR (Flu A&B, Covid) Nasopharyngeal Swab     Status: None   Collection Time: 07/09/21  3:20 PM   Specimen: Nasopharyngeal Swab; Nasopharyngeal(NP) swabs in vial transport medium  Result Value Ref Range Status   SARS Coronavirus 2 by RT PCR NEGATIVE NEGATIVE Final    Comment: (NOTE) SARS-CoV-2 target nucleic acids are NOT DETECTED.  The SARS-CoV-2 RNA is generally detectable in upper respiratory specimens during the acute phase of infection. The lowest concentration of SARS-CoV-2 viral copies this assay can detect is 138 copies/mL. A negative result does not preclude SARS-Cov-2 infection and should not be used as the sole basis for treatment or other patient management decisions. A negative result may occur with  improper specimen collection/handling,  submission of specimen other than nasopharyngeal swab, presence of viral mutation(s) within the areas targeted by this assay, and  inadequate number of viral copies(<138 copies/mL). A negative result must be combined with clinical observations, patient history, and epidemiological information. The expected result is Negative.  Fact Sheet for Patients:  EntrepreneurPulse.com.au  Fact Sheet for Healthcare Providers:  IncredibleEmployment.be  This test is no t yet approved or cleared by the [place] FDA and  has been authorized for detection and/or diagnosis of SARS-CoV-2 by FDA under an Emergency Use Authorization (EUA). This EUA will remain  in effect (meaning this test can be used) for the duration of the COVID-19 declaration under Section 564(b)(1) of the Act, 21 U.S.C.section 360bbb-3(b)(1), unless the authorization is terminated  or revoked sooner.       Influenza A by PCR NEGATIVE NEGATIVE Final   Influenza B by PCR NEGATIVE NEGATIVE Final    Comment: (NOTE) The Xpert Xpress SARS-CoV-2/FLU/RSV plus assay is intended as an aid in the diagnosis of influenza from Nasopharyngeal swab specimens and should not be used as a sole basis for treatment. Nasal washings and aspirates are unacceptable for Xpert Xpress SARS-CoV-2/FLU/RSV testing.  Fact Sheet for Patients: EntrepreneurPulse.com.au  Fact Sheet for Healthcare Providers: IncredibleEmployment.be  This test is not yet approved or cleared by the [place] FDA and has been authorized for detection and/or diagnosis of SARS-CoV-2 by FDA under an Emergency Use Authorization (EUA). This EUA will remain in effect (meaning this test can be used) for the duration of the COVID-19 declaration under Section 564(b)(1) of the Act, 21 U.S.C. section 360bbb-3(b)(1), unless the authorization is terminated or revoked.  Performed at [facility],  [address], [place], [place] [address]   MRSA Next Gen by PCR, Nasal     Status: None   Collection Time: 07/09/21  5:34 PM   Specimen: Nasal Mucosa; Nasal Swab  Result Value Ref Range Status   MRSA by PCR Next Gen NOT DETECTED NOT DETECTED Final    Comment: (NOTE) The GeneXpert MRSA Assay (FDA approved for NASAL specimens only), is one component of a comprehensive MRSA colonization surveillance program. It is not intended to diagnose MRSA infection nor to guide or monitor treatment for MRSA infections. Test performance is not FDA approved in patients less than 13 years old. Performed at [facility], [place] [address], [place], [place] [address]   Culture, blood (routine x 2)     Status: None (Preliminary result)   Collection Time: 07/10/21  6:39 PM   Specimen: BLOOD  Result Value Ref Range Status   Specimen Description   Final    BLOOD RIGHT ANTECUBITAL Performed at [facility] [address], [place], [place] [address]    Special Requests   Final    Blood Culture adequate volume BOTTLES DRAWN AEROBIC AND ANAEROBIC Performed at [facility] [address], [place], [place] [address]    Culture   Final    NO GROWTH 1 DAY Performed at [facility], [place] [address], [place], [place] [address]    Report Status PENDING  Incomplete  Culture, blood (routine x 2)     Status: None (Preliminary result)   Collection Time: 07/10/21  6:39 PM   Specimen: BLOOD  Result Value Ref Range Status   Specimen Description   Final    BLOOD LEFT ANTECUBITAL Performed at [facility] [address] [place], [place] [address]    Special Requests   Final    BOTTLES DRAWN AEROBIC ONLY Blood Culture results may not be optimal due to  an inadequate volume of blood received in culture bottles Performed at [facility] [address], [place], [place] [address]    Culture   Final    NO GROWTH 1  DAY Performed at [facility], [place] [address], [place], [place] [address]    Report Status PENDING  Incomplete    Studies/Results: DG C-Arm 1-60 Min-No Report  Result Date: 07/11/2021 Fluoroscopy was utilized by the requesting physician.  No radiographic interpretation.   ECHOCARDIOGRAM COMPLETE  Result Date: 07/11/2021    ECHOCARDIOGRAM REPORT   Patient Name:   Colleen Reynolds Date of Exam: 07/11/2021 Medical Rec #:  [id number]          Height:       66.0 in Accession #:    [id number]         Weight:       170.0 lb Date of Birth:  [birth year]          BSA:          1.866 m Patient Age:    54 years           BP:           123/44 mmHg Patient Gender: F                  HR:           79 bpm. Exam Location:  Inpatient Procedure: 2D Echo, Cardiac Doppler and Color Doppler Indications:    Afib  History:        Patient has no prior history of Echocardiogram examinations.  Sonographer:    Glo Herring Referring Phys: [place]  1. Left ventricular ejection fraction, by estimation, is 55 to 60%. The left ventricle has normal function. The left ventricle has no regional wall motion abnormalities. There is mild left ventricular hypertrophy. Left ventricular diastolic parameters were normal.  2. Right ventricular systolic function is normal. The right ventricular size is normal.  3. The mitral valve is normal in structure. Mild mitral valve regurgitation.  4. The aortic valve is tricuspid. Aortic valve regurgitation is not visualized. Aortic valve sclerosis is present, with no evidence of aortic valve stenosis.  5. The inferior vena cava is dilated in size with <50% respiratory variability, suggesting right atrial pressure of 15 mmHg. FINDINGS  Left Ventricle: Left ventricular ejection fraction, by estimation, is 55 to 60%. The left ventricle has normal function. The left ventricle has no regional wall motion abnormalities. The left ventricular internal cavity size was normal in size.  There is  mild left ventricular hypertrophy. Left ventricular diastolic parameters were normal. Right Ventricle: The right ventricular size is normal. Right vetricular wall thickness was not assessed. Right ventricular systolic function is normal. Left Atrium: Left atrial size was normal in size. Right Atrium: Right atrial size was normal in size. Pericardium: There is no evidence of pericardial effusion. Mitral Valve: The mitral valve is normal in structure. Mild mitral valve regurgitation. Tricuspid Valve: The tricuspid valve is normal in structure. Tricuspid valve regurgitation is mild. Aortic Valve: The aortic valve is tricuspid. Aortic valve regurgitation is not visualized. Aortic valve sclerosis is present, with no evidence of aortic valve stenosis. Aortic valve mean gradient measures 4.0 mmHg. Aortic valve peak gradient measures 6.2  mmHg. Aortic valve area, by VTI measures 2.03 cm. Pulmonic Valve: The pulmonic valve was normal in structure. Pulmonic valve regurgitation is mild. Aorta: The aortic root and ascending aorta are structurally  normal, with no evidence of dilitation. Venous: The inferior vena cava is dilated in size with less than 50% respiratory variability, suggesting right atrial pressure of 15 mmHg. IAS/Shunts: The interatrial septum was not assessed.  LEFT VENTRICLE PLAX 2D LVIDd:         4.20 cm   Diastology LVIDs:         2.90 cm   LV e' medial:    6.96 cm/s LV PW:         0.90 cm   LV E/e' medial:  13.0 LV IVS:        1.20 cm   LV e' lateral:   7.07 cm/s LVOT diam:     2.00 cm   LV E/e' lateral: 12.8 LV SV:         62 LV SV Index:   33 LVOT Area:     3.14 cm  RIGHT VENTRICLE             IVC RV Basal diam:  3.20 cm     IVC diam: 2.30 cm RV S prime:     10.30 cm/s LEFT ATRIUM             Index        RIGHT ATRIUM           Index LA diam:        4.00 cm 2.14 cm/m   RA Area:     17.10 cm LA Vol (A2C):   41.1 ml 22.02 ml/m  RA Volume:   39.80 ml  21.33 ml/m LA Vol (A4C):   55.6 ml 29.79  ml/m LA Biplane Vol: 47.8 ml 25.61 ml/m  AORTIC VALVE                    PULMONIC VALVE AV Area (Vmax):    2.31 cm     PV Vmax:       0.78 m/s AV Area (Vmean):   2.16 cm     PV Peak grad:  2.4 mmHg AV Area (VTI):     2.03 cm AV Vmax:           125.00 cm/s AV Vmean:          90.600 cm/s AV VTI:            0.305 m AV Peak Grad:      6.2 mmHg AV Mean Grad:      4.0 mmHg LVOT Vmax:         92.00 cm/s LVOT Vmean:        62.200 cm/s LVOT VTI:          0.197 m LVOT/AV VTI ratio: 0.65  AORTA Ao Root diam: 2.80 cm Ao Asc diam:  3.30 cm MITRAL VALVE MV Area (PHT): 4.06 cm    SHUNTS MV Decel Time: 187 msec    Systemic VTI:  0.20 m MV E velocity: 90.80 cm/s  Systemic Diam: 2.00 cm MV A velocity: 78.00 cm/s MV E/A ratio:  1.16 Dorris Carnes MD Electronically signed by Dorris Carnes MD Signature Date/Time: 07/11/2021/12:59:12 PM    Final    IR NEPHROSTOMY PLACEMENT RIGHT  Result Date: 07/10/2021 INDICATION: 76 year old female presents to the hospital with urosepsis secondary 2 a stone in the lower pole collecting system infundibulum, with local pelvicaliectasis of the lower pole. She has been referred for attempt at PCN. EXAM: IMAGE GUIDED PERCUTANEOUS NEPHROSTOMY COMPARISON:  CT 07/09/2021 MEDICATIONS: Ciprofloxacin 400 mg IV; The antibiotic was administered in an  appropriate time frame prior to skin puncture. ANESTHESIA/SEDATION: Moderate (conscious) sedation was employed during this procedure. A total of Versed 3.5 mg and Fentanyl 200 mcg was administered intravenously by the radiology nurse. Total intra-service moderate Sedation Time: 98 minutes. The patient's level of consciousness and vital signs were monitored continuously by radiology nursing throughout the procedure under my direct supervision. CONTRAST:  80 cc-administered into the collecting system(s) FLUOROSCOPY TIME:  Fluoroscopy Time: 41 minutes 24 seconds (599 mGy). COMPLICATIONS: SIR LEVEL B - Normal therapy, includes overnight admission for observation.  Intravasation of contrast at the conclusion of the case, with bacteremia/rigors PROCEDURE: Informed written consent was obtained from the patient after a thorough discussion of the procedural risks, benefits and alternatives. All questions were addressed. Maximal Sterile Barrier Technique was utilized including caps, mask, sterile gowns, sterile gloves, sterile drape, hand hygiene and skin antiseptic. A timeout was performed prior to the initiation of the procedure. Patient positioned prone position on the fluoroscopy table. Ultrasound survey of the right flank was performed with images stored and sent to PACs. The patient was then prepped and draped in the usual sterile fashion. 1% lidocaine was used to anesthetize the skin and subcutaneous tissues for local anesthesia. An Envy/chiba needle was then used in attempt to access the mildly dilated posterior inferior calyx with ultrasound guidance. After 3 attempts, we elected to forego ultrasound. A second needle puncture was then performed, targeting the infundibulum stone with a second needle from an AP approach. 1% lidocaine was used for local anesthesia, and the needle was then used to puncture the infundibulum at the site of the stone. Contrast opacification was performed. The second needle was then used in attempt to access the opacified lower pole and most posterior calyx. On the initial needle puncture, the needle tip was within the lower pole posterior calyx, however, the initial angulation was not favorable for passing the wire. A second more inferior needle approach was then performed, for a favorable angle into the calyx. On this second needle puncture the wire was passed into the lower pole collecting system calyx, though would not traverse the region the impacted stone. The Envy tri-axial system was then advanced over the wire into the collecting system under fluoroscopy. The metal stiffener and inner dilator were removed, and contrast was injected.  Contrast was clearly not within the collecting system/lower pole calyx on this passage of the triaxial system. The 4 French sheath was removed. At this point, there was not a needle within the collecting system, and another puncture of the stone was attempted. From here forward, all of the contrast was injected demonstrated some element of venous intravasation. The same needle trajectory from a caudal right posterior oblique approach was performed into the collapsed collecting system. Multiple needle punctures were performed, with only partial opacification. Ultimately the end the triaxial system was again past, which was only partially within the collapse collecting system. Multiple attempts of passing a 018 wire was performed, ultimately not within the lower pole calyx. The patient began to experience rigors after approximately 1 hour and 30 minutes of working time, 41 minutes of fluoroscopy time. We aborted given that there was a clear venous communication with the disrupted lower pole calyx. 30 cc estimated blood loss. IMPRESSION: Status post image guided attempt at a percutaneous nephrostomy of the isolated obstruction of the right lower pole calyx secondary to impacted infundibular stone. Ultimately, we were unable to place the catheter in the small space. Signed, Dulcy Fanny. Dellia Nims, [facility] Radiology Electronically Signed   By: Corrie Mckusick D.O.   On: 07/10/2021 18:03   VAS [place] LOWER EXTREMITY VENOUS (DVT)  Result Date: 07/10/2021  Lower Venous DVT Study Patient Name:  Colleen Reynolds  Date of Exam:   07/10/2021 Medical Rec #: [id number]           Accession #:    [id number] Date of Birth: [birth year]           Patient Gender: F Patient Age:   75 years Exam Location[facility]  [facility] Procedure:      VAS [place] LOWER EXTREMITY VENOUS (DVT) Referring Phys: Jolaine Artist MEMON --------------------------------------------------------------------------------   Indications: Pain.  Risk Factors: None identified. Comparison Study: No prior studies. Performing Technologist: Oliver Hum RVT  Examination Guidelines: A complete evaluation includes B-mode imaging, spectral Doppler, color Doppler, and power Doppler as needed of all accessible portions of each vessel. Bilateral testing is considered an integral part of a complete examination. Limited examinations for reoccurring indications may be performed as noted. The reflux portion of the exam is performed with the patient in reverse Trendelenburg.  +---------+---------------+---------+-----------+----------+--------------+  RIGHT     Compressibility Phasicity Spontaneity Properties Thrombus Aging  +---------+---------------+---------+-----------+----------+--------------+  CFV       Full            Yes       Yes                                    +---------+---------------+---------+-----------+----------+--------------+  SFJ       Full                                                             +---------+---------------+---------+-----------+----------+--------------+  FV Prox   Full                                                             +---------+---------------+---------+-----------+----------+--------------+  FV Mid    Full                                                             +---------+---------------+---------+-----------+----------+--------------+  FV Distal Full                                                             +---------+---------------+---------+-----------+----------+--------------+  PFV       Full                                                             +---------+---------------+---------+-----------+----------+--------------+  POP       Full            Yes       Yes                                    +---------+---------------+---------+-----------+----------+--------------+  PTV       Full                                                              +---------+---------------+---------+-----------+----------+--------------+  PERO      Full                                                             +---------+---------------+---------+-----------+----------+--------------+   +----+---------------+---------+-----------+----------+--------------+  LEFT Compressibility Phasicity Spontaneity Properties Thrombus Aging  +----+---------------+---------+-----------+----------+--------------+  CFV  Full            Yes       Yes                                    +----+---------------+---------+-----------+----------+--------------+     Summary: RIGHT: - There is no evidence of deep vein thrombosis in the lower extremity.  - No cystic structure found in the popliteal fossa.  LEFT: - No evidence of common femoral vein obstruction.  *See table(s) above for measurements and observations. Electronically signed by Orlie Pollen on 07/10/2021 at 6:58:08 PM.    Final     Assessment: Procedure(s): CYSTOSCOPY WITH RETROGRADE PYELOGRAM/URETERAL STENT PLACEMENT, 1 Day Post-Op  doing well.  The patient was complaining of what appears to be ureteral colic.  I recommended for this that we start her on tamsulosin.  She continues currently on IV antibiotics, would recommend that she be placed on culture specific antibiotics for 2 weeks.  The patient will be scheduled for follow-up with Dr. Abner Greenspan for completion ureteroscopy in the coming weeks after infection has been completely treated.  I will start the patient on tamsulosin.    Louis Meckel, MD Urology 07/12/2021, 12:32 PM

## 2021-07-12 NOTE — Progress Notes (Signed)
PROGRESS NOTE    Colleen Reynolds  BMW:[id number] DOB: 08-21-1945 DOA: 07/09/2021 PCP: Billie Ruddy, MD    Brief Narrative:  HPI: Colleen Reynolds is a 76 y.o. female with medical history significant of chronic atrial fibrillation on anticoagulation, previous history of kidney stones, who presents to the hospital with right flank pain.  Reports that symptoms started just over 24 hours ago.  Discomfort started in right flank as well as right abdomen.  She progressively became nauseous and had intractable vomiting.  She was unable to keep anything down.  She felt increasingly weak and lightheaded.  She also experienced chills.  She denied any chest pain or shortness of breath.  She has not had any diarrhea.   ED Course: She was evaluated the emergency room where she wasnoted to be hypotensive and tachycardic.  EKG showed rapid atrial fibrillation.  Lactic acid noted to be elevated.  Urinalysis indicated possible infection.  CT abdomen did show concern for perinephric stranding as well as renal calculus.  She was started on IV fluids per sepsis protocol.  She was started on IV antibiotics.  Case was discussed with urology.  She was admitted for further management.   Assessment & Plan:   Principal Problem:   Pyelonephritis Active Problems:   Sepsis ([place])   Renal calculus, right   Atrial fibrillation, chronic (HCC)   Sepsis secondary to pyelonephritis and gram neg bacteremia -Noted to be hypotensive, tachycardic and tachypneic on arrival. -Started on cefepime -Blood cultures positive for E coli -Lactic acid elevated, but normalized with IV fluids -Hypotensive on admission, overall blood pressures appear to be improving with IV fluids -Based on culture data, antibiotics discussed with infectious disease and she will be transition to cefadroxil to complete her course   Right renal calculus -Seen by interventional radiology who attempted to place percutaneous nephrostomy, but was  unable to do so -Subsequently seen by urology and underwent stent placement -She will need to follow-up with urology for definitive management of stone for follow-up ureteroscopy in 2 weeks -Started on Flomax for ureteral colic   Atrial fibrillation with rapid ventricular response -Likely precipitated by sepsis -Chronically on Coreg, held on admission due to hypotension -Since blood pressures have since stabilized, Coreg has been resumed -CHADS2VASc of 2.  -Restarted on Eliquis this morning -Echocardiogram with normal ejection fraction     DVT prophylaxis: SCDs Start: 07/09/21 1723 apixaban (ELIQUIS) tablet 5 mg  Code Status: full code Family Communication: updated daughter over the phone 1/6 Disposition Plan: Status is: Inpatient  Remains inpatient appropriate because: continued management of sepsis and obstructing renal stone    Consultants:  Urology Interventional radiology  Procedures:  1/6 attempted percutaneous nephrostomy, unsuccessful 1/7 cystoscopy with successful stent placement  Antimicrobials:  Cefepime 1/5> 1/8 Cefadroxil 1/8>   Subjective: Describes some pain in her right lower quadrant this morning which she feels may be related to her stent/stone.  Overall she feels that the swelling in her extremities is improving.  Objective: Vitals:   07/12/21 1213 07/12/21 1517 07/12/21 1815 07/12/21 2028  BP:  136/88 127/87 127/74  Pulse:  (!) 107 (!) 114 99  Resp:  18 (!) 23 18  Temp: 98.8 F (37.1 C) 98.3 F (36.8 C) 98.7 F (37.1 C) 98.3 F (36.8 C)  TempSrc: Oral Oral  Oral  SpO2:  93%  95%  Weight:      Height:        Intake/Output Summary (Last 24 hours) at 07/12/2021 2121 Last  data filed at 07/12/2021 1520 Gross per 24 hour  Intake 340.06 ml  Output --  Net 340.06 ml   Filed Weights   07/09/21 0952  Weight: 77.1 kg    Examination:  General exam: Appears calm and comfortable  Respiratory system: Clear to auscultation. Respiratory effort  normal. Cardiovascular system: S1 & S2 heard, regular. No JVD, murmurs, rubs, gallops or clicks. No pedal edema. Gastrointestinal system: Abdomen is nondistended, soft and nontender. No organomegaly or masses felt. Normal bowel sounds heard. Central nervous system: Alert and oriented. No focal neurological deficits. Extremities: Symmetric 5 x 5 power. Skin: No rashes, lesions or ulcers Psychiatry: Judgement and insight appear normal. Mood & affect appropriate.     Data Reviewed: I have personally reviewed following labs and imaging studies  CBC: Recent Labs  Lab 07/09/21 1000 07/10/21 0549 07/11/21 0259 07/12/21 0255  WBC 4.7 12.4* 9.9 9.0  HGB 14.2 11.8* 10.6* 11.2*  HCT 41.4 35.3* 32.0* 33.5*  MCV 92.0 95.4 94.4 94.9  PLT 149* 126* 113* 496*   Basic Metabolic Panel: Recent Labs  Lab 07/09/21 1000 07/10/21 0549 07/11/21 0259  NA 133* 138 135  K 3.6 3.8 3.6  CL 102 110 107  CO2 16* 20* 23  GLUCOSE 181* 112* 98  BUN 18 18 14   CREATININE 1.17* 0.85 0.81  CALCIUM 9.3 8.8* 8.6*   GFR: Estimated Creatinine Clearance: 64.8 mL/min (by C-G formula based on SCr of 0.81 mg/dL). Liver Function Tests: Recent Labs  Lab 07/09/21 1000 07/10/21 0549  AST 22 27  ALT 19 22  ALKPHOS 86 48  BILITOT 2.5* 1.7*  PROT 7.2 5.9*  ALBUMIN 3.9 2.8*   Recent Labs  Lab 07/09/21 1000  LIPASE <10*   No results for input(s): AMMONIA in the last 168 hours. Coagulation Profile: Recent Labs  Lab 07/10/21 1840  INR 1.2   Cardiac Enzymes: No results for input(s): CKTOTAL, CKMB, CKMBINDEX, TROPONINI in the last 168 hours. BNP (last 3 results) No results for input(s): PROBNP in the last 8760 hours. HbA1C: No results for input(s): HGBA1C in the last 72 hours. CBG: No results for input(s): GLUCAP in the last 168 hours. Lipid Profile: No results for input(s): CHOL, HDL, LDLCALC, TRIG, CHOLHDL, LDLDIRECT in the last 72 hours. Thyroid Function Tests: Recent Labs    07/10/21 0549   TSH 1.090   Anemia Panel: No results for input(s): VITAMINB12, FOLATE, FERRITIN, TIBC, IRON, RETICCTPCT in the last 72 hours. Sepsis Labs: Recent Labs  Lab 07/09/21 1146 07/09/21 1400 07/10/21 0549  LATICACIDVEN 3.4* 2.7* 0.8    Recent Results (from the past 240 hour(s))  Urine Culture     Status: None   Collection Time: 07/09/21 11:43 AM   Specimen: Urine, Clean Catch  Result Value Ref Range Status   Specimen Description   Final    URINE, CLEAN CATCH Performed at [facility], [address], [place], [place] [address]    Special Requests   Final    NONE Performed at [facility], [address], [place], [place] [address]    Culture   Final    NO GROWTH Performed at [facility], [place] [address], [place], [place] [address]    Report Status 07/10/2021 FINAL  Final  Culture, blood (routine x 2)     Status: Abnormal   Collection Time: 07/09/21 11:46 AM   Specimen: Left Antecubital; Blood  Result Value Ref Range Status   Specimen Description   Final  LEFT ANTECUBITAL Performed at [facility], [address] [place], [place] [address]    Special Requests   Final    BOTTLES DRAWN AEROBIC AND ANAEROBIC Blood Culture adequate volume Performed at [facility], [address] [place], [place] [address]    Culture  Setup Time   Final    GRAM NEGATIVE RODS IN BOTH AEROBIC AND ANAEROBIC BOTTLES CRITICAL RESULT CALLED TO, READ BACK BY AND VERIFIED WITH: M BELL,PHARMD@0716  07/10/21 [place] Performed at [facility], [place] [address], [place], [place]    Culture ESCHERICHIA COLI (A)  Final   Report Status 07/12/2021 FINAL  Final   Organism ID, Bacteria ESCHERICHIA COLI  Final      Susceptibility   Escherichia coli - MIC*    AMPICILLIN <=2 SENSITIVE Sensitive     CEFAZOLIN <=4 SENSITIVE Sensitive     CEFEPIME <=0.12 SENSITIVE Sensitive     CEFTAZIDIME <=1 SENSITIVE Sensitive      CEFTRIAXONE <=0.25 SENSITIVE Sensitive     CIPROFLOXACIN <=0.25 SENSITIVE Sensitive     GENTAMICIN <=1 SENSITIVE Sensitive     IMIPENEM <=0.25 SENSITIVE Sensitive     TRIMETH/SULFA <=20 SENSITIVE Sensitive     AMPICILLIN/SULBACTAM <=2 SENSITIVE Sensitive     PIP/TAZO <=4 SENSITIVE Sensitive     * ESCHERICHIA COLI  Culture, blood (routine x 2)     Status: Abnormal   Collection Time: 07/09/21 11:46 AM   Specimen: BLOOD RIGHT HAND  Result Value Ref Range Status   Specimen Description   Final    BLOOD RIGHT HAND Performed at [facility], [address], [place], [place]    Special Requests   Final    BOTTLES DRAWN AEROBIC AND ANAEROBIC Blood Culture results may not be optimal due to an excessive volume of blood received in culture bottles Performed at [facility], [address], [place], [place]    Culture  Setup Time   Final    GRAM NEGATIVE RODS IN BOTH AEROBIC AND ANAEROBIC BOTTLES CRITICAL VALUE NOTED.  VALUE IS CONSISTENT WITH PREVIOUSLY REPORTED AND CALLED VALUE.    Culture (A)  Final    ESCHERICHIA COLI SUSCEPTIBILITIES PERFORMED ON PREVIOUS CULTURE WITHIN THE LAST 5 DAYS. Performed at [facility], [place] [address] [place], [place]    Report Status 07/12/2021 FINAL  Final  Blood Culture ID Panel (Reflexed)     Status: Abnormal   Collection Time: 07/09/21 11:46 AM  Result Value Ref Range Status   Enterococcus faecalis NOT DETECTED NOT DETECTED Final   Enterococcus Faecium NOT DETECTED NOT DETECTED Final   Listeria monocytogenes NOT DETECTED NOT DETECTED Final   Staphylococcus species NOT DETECTED NOT DETECTED Final   Staphylococcus aureus (BCID) NOT DETECTED NOT DETECTED Final   Staphylococcus epidermidis NOT DETECTED NOT DETECTED Final   Staphylococcus lugdunensis NOT DETECTED NOT DETECTED Final   Streptococcus species NOT DETECTED NOT DETECTED Final   Streptococcus agalactiae NOT DETECTED  NOT DETECTED Final   Streptococcus pneumoniae NOT DETECTED NOT DETECTED Final   Streptococcus pyogenes NOT DETECTED NOT DETECTED Final   A.calcoaceticus-baumannii NOT DETECTED NOT DETECTED Final   Bacteroides fragilis NOT DETECTED NOT DETECTED Final   Enterobacterales DETECTED (A) NOT DETECTED Final    Comment: Enterobacterales represent a large order of gram negative bacteria, not a single organism. CRITICAL RESULT CALLED TO, READ BACK BY AND VERIFIED WITH: M BELL,PHARMD@0715  07/10/21 [place]    Enterobacter cloacae complex NOT DETECTED NOT  DETECTED Final   Escherichia coli DETECTED (A) NOT DETECTED Final    Comment: CRITICAL RESULT CALLED TO, READ BACK BY AND VERIFIED WITH: M BELL,PHARMD@0715  07/10/21 [place]    Klebsiella aerogenes NOT DETECTED NOT DETECTED Final   Klebsiella oxytoca NOT DETECTED NOT DETECTED Final   Klebsiella pneumoniae NOT DETECTED NOT DETECTED Final   Proteus species NOT DETECTED NOT DETECTED Final   Salmonella species NOT DETECTED NOT DETECTED Final   Serratia marcescens NOT DETECTED NOT DETECTED Final   Haemophilus influenzae NOT DETECTED NOT DETECTED Final   Neisseria meningitidis NOT DETECTED NOT DETECTED Final   Pseudomonas aeruginosa NOT DETECTED NOT DETECTED Final   Stenotrophomonas maltophilia NOT DETECTED NOT DETECTED Final   Candida albicans NOT DETECTED NOT DETECTED Final   Candida auris NOT DETECTED NOT DETECTED Final   Candida glabrata NOT DETECTED NOT DETECTED Final   Candida krusei NOT DETECTED NOT DETECTED Final   Candida parapsilosis NOT DETECTED NOT DETECTED Final   Candida tropicalis NOT DETECTED NOT DETECTED Final   Cryptococcus neoformans/gattii NOT DETECTED NOT DETECTED Final   CTX-M ESBL NOT DETECTED NOT DETECTED Final   Carbapenem resistance IMP NOT DETECTED NOT DETECTED Final   Carbapenem resistance KPC NOT DETECTED NOT DETECTED Final   Carbapenem resistance NDM NOT DETECTED NOT DETECTED Final   Carbapenem resist OXA 48 LIKE NOT DETECTED NOT  DETECTED Final   Carbapenem resistance VIM NOT DETECTED NOT DETECTED Final    Comment: Performed at [facility], [address] [place], [place] [address]  Resp Panel by RT-PCR (Flu A&B, Covid) Nasopharyngeal Swab     Status: None   Collection Time: 07/09/21  3:20 PM   Specimen: Nasopharyngeal Swab; Nasopharyngeal(NP) swabs in vial transport medium  Result Value Ref Range Status   SARS Coronavirus 2 by RT PCR NEGATIVE NEGATIVE Final    Comment: (NOTE) SARS-CoV-2 target nucleic acids are NOT DETECTED.  The SARS-CoV-2 RNA is generally detectable in upper respiratory specimens during the acute phase of infection. The lowest concentration of SARS-CoV-2 viral copies this assay can detect is 138 copies/mL. A negative result does not preclude SARS-Cov-2 infection and should not be used as the sole basis for treatment or other patient management decisions. A negative result may occur with  improper specimen collection/handling, submission of specimen other than nasopharyngeal swab, presence of viral mutation(s) within the areas targeted by this assay, and inadequate number of viral copies(<138 copies/mL). A negative result must be combined with clinical observations, patient history, and epidemiological information. The expected result is Negative.  Fact Sheet for Patients:  EntrepreneurPulse.com.au  Fact Sheet for Healthcare Providers:  IncredibleEmployment.be  This test is no t yet approved or cleared by the [place] FDA and  has been authorized for detection and/or diagnosis of SARS-CoV-2 by FDA under an Emergency Use Authorization (EUA). This EUA will remain  in effect (meaning this test can be used) for the duration of the COVID-19 declaration under Section 564(b)(1) of the Act, 21 U.S.C.section 360bbb-3(b)(1), unless the authorization is terminated  or revoked sooner.       Influenza A by PCR NEGATIVE NEGATIVE Final   Influenza B  by PCR NEGATIVE NEGATIVE Final    Comment: (NOTE) The Xpert Xpress SARS-CoV-2/FLU/RSV plus assay is intended as an aid in the diagnosis of influenza from Nasopharyngeal swab specimens and should not be used as a sole basis for treatment. Nasal washings and aspirates are unacceptable for Xpert Xpress SARS-CoV-2/FLU/RSV testing.  Fact Sheet for Patients: EntrepreneurPulse.com.au  Fact  Sheet for Healthcare Providers: IncredibleEmployment.be  This test is not yet approved or cleared by the [place] and has been authorized for detection and/or diagnosis of SARS-CoV-2 by FDA under an Emergency Use Authorization (EUA). This EUA will remain in effect (meaning this test can be used) for the duration of the COVID-19 declaration under Section 564(b)(1) of the Act, 21 U.S.C. section 360bbb-3(b)(1), unless the authorization is terminated or revoked.  Performed at [facility], [address] [place], [place] [address]   MRSA Next Gen by PCR, Nasal     Status: None   Collection Time: 07/09/21  5:34 PM   Specimen: Nasal Mucosa; Nasal Swab  Result Value Ref Range Status   MRSA by PCR Next Gen NOT DETECTED NOT DETECTED Final    Comment: (NOTE) The GeneXpert MRSA Assay (FDA approved for NASAL specimens only), is one component of a comprehensive MRSA colonization surveillance program. It is not intended to diagnose MRSA infection nor to guide or monitor treatment for MRSA infections. Test performance is not FDA approved in patients less than 11 years old. Performed at [facility], [place] [address] [place], [place] [address]   Culture, blood (routine x 2)     Status: None (Preliminary result)   Collection Time: 07/10/21  6:39 PM   Specimen: BLOOD  Result Value Ref Range Status   Specimen Description   Final    BLOOD RIGHT ANTECUBITAL Performed at [facility] [address]  [place], [place] [address]    Special Requests   Final    Blood Culture adequate volume BOTTLES DRAWN AEROBIC AND ANAEROBIC Performed at [facility] [address], [place], [place] [address]    Culture   Final    NO GROWTH 1 DAY Performed at [facility], [place] [address], [place], [place] [address]    Report Status PENDING  Incomplete  Culture, blood (routine x 2)     Status: None (Preliminary result)   Collection Time: 07/10/21  6:39 PM   Specimen: BLOOD  Result Value Ref Range Status   Specimen Description   Final    BLOOD LEFT ANTECUBITAL Performed at [facility] [address], [place], [place] [address]    Special Requests   Final    BOTTLES DRAWN AEROBIC ONLY Blood Culture results may not be optimal due to an inadequate volume of blood received in culture bottles Performed at [facility] [address], [place],  [address]    Culture   Final    NO GROWTH 1 DAY Performed at [facility], [place] [address], [place],  [address]    Report Status PENDING  Incomplete         Radiology Studies: DG C-Arm 1-60 Min-No Report  Result Date: 07/11/2021 Fluoroscopy was utilized by the requesting physician.  No radiographic interpretation.   ECHOCARDIOGRAM COMPLETE  Result Date: 07/11/2021    ECHOCARDIOGRAM REPORT   Patient Name:   [name] Date of Exam: 07/11/2021 Medical Rec #:  [id number]          Height:       66.0 in Accession #:    [id number]         Weight:       170.0 lb Date of Birth:  16-Jul-1945          BSA:          1.866 m Patient Age:  75 years           BP:           123/44 mmHg Patient Gender: F                  HR:           79 bpm. Exam Location:  Inpatient Procedure: 2D Echo, Cardiac Doppler and Color Doppler Indications:    Afib  History:        Patient has no prior history of Echocardiogram examinations.  Sonographer:    Glo Herring Referring Phys: [place]  1. Left ventricular ejection fraction, by estimation, is 55 to 60%. The left ventricle has normal function. The left ventricle has no regional wall motion abnormalities. There is mild left ventricular hypertrophy. Left ventricular diastolic parameters were normal.  2. Right ventricular systolic function is normal. The right ventricular size is normal.  3. The mitral valve is normal in structure. Mild mitral valve regurgitation.  4. The aortic valve is tricuspid. Aortic valve regurgitation is not visualized. Aortic valve sclerosis is present, with no evidence of aortic valve stenosis.  5. The inferior vena cava is dilated in size with <50% respiratory variability, suggesting right atrial pressure of 15 mmHg. FINDINGS  Left Ventricle: Left ventricular ejection fraction, by estimation, is 55 to 60%. The left ventricle has normal function. The left ventricle has no regional wall motion abnormalities. The left ventricular internal cavity size was normal in size. There is  mild left ventricular hypertrophy. Left ventricular diastolic parameters were normal. Right Ventricle: The right ventricular size is normal. Right vetricular wall thickness was not assessed. Right ventricular systolic function is normal. Left Atrium: Left atrial size was normal in size. Right Atrium: Right atrial size was normal in size. Pericardium: There is no evidence of pericardial effusion. Mitral Valve: The mitral valve is normal in structure. Mild mitral valve regurgitation. Tricuspid Valve: The tricuspid valve is normal in structure. Tricuspid valve regurgitation is mild. Aortic Valve: The aortic valve is tricuspid. Aortic valve regurgitation is not visualized. Aortic valve sclerosis is present, with no evidence of aortic valve stenosis. Aortic valve mean gradient measures 4.0 mmHg. Aortic valve peak gradient measures 6.2  mmHg. Aortic valve area, by VTI measures 2.03 cm. Pulmonic Valve: The pulmonic valve was normal in structure.  Pulmonic valve regurgitation is mild. Aorta: The aortic root and ascending aorta are structurally normal, with no evidence of dilitation. Venous: The inferior vena cava is dilated in size with less than 50% respiratory variability, suggesting right atrial pressure of 15 mmHg. IAS/Shunts: The interatrial septum was not assessed.  LEFT VENTRICLE PLAX 2D LVIDd:         4.20 cm   Diastology LVIDs:         2.90 cm   LV e' medial:    6.96 cm/s LV PW:         0.90 cm   LV E/e' medial:  13.0 LV IVS:        1.20 cm   LV e' lateral:   7.07 cm/s LVOT diam:     2.00 cm   LV E/e' lateral: 12.8 LV SV:         62 LV SV Index:   33 LVOT Area:     3.14 cm  RIGHT VENTRICLE             IVC RV Basal diam:  3.20 cm     IVC diam: 2.30 cm RV  S prime:     10.30 cm/s LEFT ATRIUM             Index        RIGHT ATRIUM           Index LA diam:        4.00 cm 2.14 cm/m   RA Area:     17.10 cm LA Vol (A2C):   41.1 ml 22.02 ml/m  RA Volume:   39.80 ml  21.33 ml/m LA Vol (A4C):   55.6 ml 29.79 ml/m LA Biplane Vol: 47.8 ml 25.61 ml/m  AORTIC VALVE                    PULMONIC VALVE AV Area (Vmax):    2.31 cm     PV Vmax:       0.78 m/s AV Area (Vmean):   2.16 cm     PV Peak grad:  2.4 mmHg AV Area (VTI):     2.03 cm AV Vmax:           125.00 cm/s AV Vmean:          90.600 cm/s AV VTI:            0.305 m AV Peak Grad:      6.2 mmHg AV Mean Grad:      4.0 mmHg LVOT Vmax:         92.00 cm/s LVOT Vmean:        62.200 cm/s LVOT VTI:          0.197 m LVOT/AV VTI ratio: 0.65  AORTA Ao Root diam: 2.80 cm Ao Asc diam:  3.30 cm MITRAL VALVE MV Area (PHT): 4.06 cm    SHUNTS MV Decel Time: 187 msec    Systemic VTI:  0.20 m MV E velocity: 90.80 cm/s  Systemic Diam: 2.00 cm MV A velocity: 78.00 cm/s MV E/A ratio:  1.16 Dorris Carnes MD Electronically signed by Dorris Carnes MD Signature Date/Time: 07/11/2021/12:59:12 PM    Final         Scheduled Meds:  apixaban  5 mg Oral BID   carvedilol  3.125 mg Oral BID WC   cefadroxil  1,000 mg Oral BID    Chlorhexidine Gluconate Cloth  6 each Topical Q0600   famotidine  20 mg Oral Daily   FLUoxetine  40 mg Oral Daily   tamsulosin  0.4 mg Oral Daily   valACYclovir  1,000 mg Oral Daily   Continuous Infusions:  sodium chloride Stopped (07/10/21 0945)     LOS: 3 days    Time spent: 35 mins    Kathie Dike, MD [facility]   If 7PM-7AM, please contact night-coverage [email or web address]  07/12/2021, 9:21 PM

## 2021-07-12 NOTE — Progress Notes (Signed)
PHARMACY NOTE:  ANTIMICROBIAL RENAL DOSAGE ADJUSTMENT  Current antimicrobial regimen includes a mismatch between antimicrobial dosage and estimated renal function.  As per policy approved by the Pharmacy & Therapeutics and Medical Executive Committees, the antimicrobial dosage will be adjusted accordingly.  Current antimicrobial dosage:  cefadroxil 500 mg PO BID   Indication: bacteremia   Renal Function:  Estimated Creatinine Clearance: 64.8 mL/min (by C-G formula based on SCr of 0.81 mg/dL). []      On intermittent HD, scheduled: []      On CRRT    Antimicrobial dosage has been changed to:   - cefadroxil 1000 mg PO BID   Additional comments:   Thank you for allowing pharmacy to be a part of this patient's care.   Royetta Asal, PharmD, BCPS 07/12/2021 2:32 PM

## 2021-07-13 ENCOUNTER — Other Ambulatory Visit: Payer: Self-pay | Admitting: Urology

## 2021-07-13 DIAGNOSIS — R310 Gross hematuria: Secondary | ICD-10-CM | POA: Diagnosis not present

## 2021-07-13 LAB — CBC
HCT: 33.7 % — ABNORMAL LOW (ref 36.0–46.0)
Hemoglobin: 11.4 g/dL — ABNORMAL LOW (ref 12.0–15.0)
MCH: 31.5 pg (ref 26.0–34.0)
MCHC: 33.8 g/dL (ref 30.0–36.0)
MCV: 93.1 fL (ref 80.0–100.0)
Platelets: 184 10*3/uL (ref 150–400)
RBC: 3.62 MIL/uL — ABNORMAL LOW (ref 3.87–5.11)
RDW: 13.4 % (ref 11.5–15.5)
WBC: 8.6 10*3/uL (ref 4.0–10.5)
nRBC: 0 % (ref 0.0–0.2)

## 2021-07-13 MED ORDER — CARVEDILOL 6.25 MG PO TABS: 6.2500 mg | ORAL_TABLET | Freq: Two times a day (BID) | ORAL | 1 refills | Status: DC

## 2021-07-13 MED ORDER — POLYETHYLENE GLYCOL 3350 17 G PO PACK
17.0000 g | PACK | Freq: Every day | ORAL | 0 refills | Status: DC
Start: 2021-07-13 — End: 2021-11-21

## 2021-07-13 MED ORDER — HYDROCODONE-ACETAMINOPHEN 5-325 MG PO TABS: 1.0000 | ORAL_TABLET | Freq: Four times a day (QID) | ORAL | 0 refills | Status: DC | PRN

## 2021-07-13 MED ORDER — TAMSULOSIN HCL 0.4 MG PO CAPS: 0.4000 mg | ORAL_CAPSULE | Freq: Every day | ORAL | 0 refills | Status: DC

## 2021-07-13 MED ORDER — CEFADROXIL 500 MG PO CAPS: 1000.0000 mg | ORAL_CAPSULE | Freq: Two times a day (BID) | ORAL | 0 refills | Status: DC

## 2021-07-13 NOTE — Anesthesia Postprocedure Evaluation (Signed)
Anesthesia Post Note  Patient: Colleen Reynolds  Procedure(s) Performed: CYSTOSCOPY WITH RETROGRADE PYELOGRAM/URETERAL STENT PLACEMENT (Right: Ureter)     Patient location during evaluation: PACU Anesthesia Type: General Level of consciousness: awake and alert Pain management: pain level controlled Vital Signs Assessment: post-procedure vital signs reviewed and stable Respiratory status: spontaneous breathing, nonlabored ventilation, respiratory function stable and patient connected to nasal cannula oxygen Cardiovascular status: blood pressure returned to baseline and stable Postop Assessment: no apparent nausea or vomiting Anesthetic complications: no   No notable events documented.  Last Vitals:  Vitals:   07/13/21 0218 07/13/21 0543  BP: 132/71 138/90  Pulse: 86 (!) 101  Resp: 14 18  Temp: 36.7 C 36.7 C  SpO2: 92% 96%    Last Pain:  Vitals:   07/13/21 0614  TempSrc:   PainSc: [place]

## 2021-07-13 NOTE — Care Management Important Message (Signed)
Important Message  Patient Details IM Letter given to the Patient. Name: SIMRIT GOHLKE MRN: [id number] Date of Birth: [birth year]   Medicare Important Message Given:  Yes     Kerin Salen 07/13/2021, 11:41 AM

## 2021-07-13 NOTE — Progress Notes (Signed)
2 Days Post-Op Subjective: Denies abdominal pain or flank pain. No nausea or emesis. No fevers. Voiding some mild hematuria after restarting eliquis.  Objective: Vital signs in last 24 hours: Temp:  [98 F (36.7 C)-98.7 F (37.1 C)] 98 F (36.7 C) (01/09 0543) Pulse Rate:  [86-114] 101 (01/09 0543) Resp:  [14-23] 18 (01/09 0543) BP: (127-142)/(71-90) 138/90 (01/09 0543) SpO2:  [92 %-96 %] 96 % (01/09 0543)  Intake/Output from previous day: 01/08 0701 - 01/09 0700 In: 150 [P.O.:150] Out: 1000 [Urine:1000] Intake/Output this shift: Total I/O In: -  Out: 350 [Urine:350]  Physical Exam:  General: Alert and oriented CV: RRR Lungs: Clear Abdomen: Soft, ND, NT Ext: NT, No erythema  Lab Results: Recent Labs    07/11/21 0259 07/12/21 0255 07/13/21 1117  HGB 10.6* 11.2* 11.4*  HCT 32.0* 33.5* 33.7*   BMET Recent Labs    07/11/21 0259  NA 135  K 3.6  CL 107  CO2 23  GLUCOSE 98  BUN 14  CREATININE 0.81  CALCIUM 8.6*     Studies/Results: No results found.  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 Louis Meckel on 07/11/2020  -Will arrange f/u for definitive ureteroscopy with laser lithotripsy.  -Should be on 2 weeks culture specific antibiotics -Ok for discharge from GU standpoint -Please call with questions   LOS: 4 days   Matt R. Dollye Glasser MD 07/13/2021, 12:39 PM [facility]  Pager: [phone]

## 2021-07-13 NOTE — Progress Notes (Signed)
Patient c/o increased amount of blood in urine this morning. Will continue to monitor. Urine was light red colored.

## 2021-07-13 NOTE — Telephone Encounter (Signed)
Echo was done during hospital visit.

## 2021-07-13 NOTE — Discharge Summary (Signed)
Physician Discharge Summary  Colleen Reynolds PJA:[id number] DOB: 02/14/1946 DOA: 07/09/2021  PCP: Billie Ruddy, MD  Admit date: 07/09/2021 Discharge date: 07/13/2021  Admitted From: Home Disposition: Home   Recommendations for Outpatient Follow-up:  Follow up with PCP in 1-2 weeks Please obtain BMP/CBC in one week Follow-up with urology in 2 weeks for definitive management of stone Patient plans to establish care with cardiology as an outpatient  Home Health: Equipment/Devices:  Discharge Condition: Stable CODE STATUS: Full code Diet recommendation: Heart healthy  Brief/Interim Summary: HPI: Colleen Reynolds is a 76 y.o. female with medical history significant of chronic atrial fibrillation on anticoagulation, previous history of kidney stones, who presents to the hospital with right flank pain.  Reports that symptoms started just over 24 hours ago.  Discomfort started in right flank as well as right abdomen.  She progressively became nauseous and had intractable vomiting.  She was unable to keep anything down.  She felt increasingly weak and lightheaded.  She also experienced chills.  She denied any chest pain or shortness of breath.  She has not had any diarrhea.   ED Course: She was evaluated the emergency room where she wasnoted to be hypotensive and tachycardic.  EKG showed rapid atrial fibrillation.  Lactic acid noted to be elevated.  Urinalysis indicated possible infection.  CT abdomen did show concern for perinephric stranding as well as renal calculus.  She was started on IV fluids per sepsis protocol.  She was started on IV antibiotics.  Case was discussed with urology.  She was admitted for further management.  Discharge Diagnoses:  Principal Problem:   Pyelonephritis Active Problems:   Sepsis ([place])   Renal calculus, right   Atrial fibrillation, chronic (HCC)  Sepsis secondary to pyelonephritis and gram neg bacteremia -Noted to be hypotensive, tachycardic and  tachypneic on arrival. -Started on cefepime -Blood cultures positive for E coli -Lactic acid elevated, but normalized with IV fluids -Hypotensive on admission, overall blood pressures appear to be improved with IV fluids -Based on culture data, antibiotics discussed with infectious disease and she will be transition to cefadroxil to complete her course   Right renal calculus -Seen by interventional radiology who attempted to place percutaneous nephrostomy, but was unable to do so -Subsequently seen by urology and underwent stent placement -She will need to follow-up with urology for definitive management of stone for follow-up ureteroscopy in 2 weeks -Started on Flomax for ureteral colic   Atrial fibrillation with rapid ventricular response -Likely precipitated by sepsis -Chronically on Coreg, held on admission due to hypotension -Since blood pressures have since stabilized, Coreg has been resumed and has been titrated up to 6.25 mg twice daily -CHADS2VASc of 2.  -Restarted on Eliquis 24 hours after stent placement -Echocardiogram with normal ejection fraction  Hematuria -Related to stent placement in the setting of Eliquis -Hemoglobin has been stable -Discussed with urology and recommended to continue on Eliquis on discharge despite hematuria, unless she has had a significant drop in hemoglobin with associated symptoms of anemia.  In which case Eliquis should be held. Suspect hematuria to be self-limited.  Discharge Instructions  Discharge Instructions     Diet - low sodium heart healthy   Complete by: As directed    Increase activity slowly   Complete by: As directed       Allergies as of 07/13/2021       Reactions   Clindamycin/lincomycin Nausea And Vomiting   Lovastatin Other (See Comments)   Fatigue, muscle aches  Penicillins    Tolerated Rocephin 07/10/21        Medication List     TAKE these medications    apixaban 5 MG Tabs tablet Commonly known as:  Eliquis Take 1 tablet (5 mg total) by mouth 2 (two) times daily.   carvedilol 6.25 MG tablet Commonly known as: COREG Take 1 tablet (6.25 mg total) by mouth 2 (two) times daily with a meal. What changed:  medication strength how much to take   cefadroxil 500 MG capsule Commonly known as: DURICEF Take 2 capsules (1,000 mg total) by mouth 2 (two) times daily.   famotidine 20 MG tablet Commonly known as: PEPCID Take 20 mg by mouth daily.   FLUoxetine 40 MG capsule Commonly known as: PROZAC TAKE 1 CAPSULE BY MOUTH EVERY DAY   HYDROcodone-acetaminophen 5-325 MG tablet Commonly known as: NORCO/VICODIN Take 1 tablet by mouth every 6 (six) hours as needed for moderate pain.   polyethylene glycol 17 g packet Commonly known as: MIRALAX / GLYCOLAX Take 17 g by mouth daily.   tamsulosin 0.4 MG Caps capsule Commonly known as: FLOMAX Take 1 capsule (0.4 mg total) by mouth daily. Start taking on: July 14, 2021   valACYclovir 1000 MG tablet Commonly known as: Valtrex Take 1 tablet (1,000 mg total) by mouth daily.        Follow-up Information     Janith Lima, MD Follow up.   Specialty: Urology Why: office will call you with appointment Contact information: [place] [address] [phone]         Billie Ruddy, MD. Schedule an appointment as soon as possible for a visit in 2 week(s).   Specialty: Family Medicine Contact information: [place] [address] [phone]                Allergies  Allergen Reactions   Clindamycin/Lincomycin Nausea And Vomiting   Lovastatin Other (See Comments)    Fatigue, muscle aches   Penicillins     Tolerated Rocephin 07/10/21    Consultations: Urology Interventional radiology   Procedures/Studies: DG C-Arm 1-60 Min-No Report  Result Date: 07/11/2021 Fluoroscopy was utilized by the requesting physician.  No radiographic interpretation.   ECHOCARDIOGRAM  COMPLETE  Result Date: 07/11/2021    ECHOCARDIOGRAM REPORT   Patient Name:   Colleen Reynolds Date of Exam: 07/11/2021 Medical Rec #:  [id number]          Height:       66.0 in Accession #:    [id number]         Weight:       170.0 lb Date of Birth:  03-21-1946          BSA:          1.866 m Patient Age:    76 years           BP:           123/44 mmHg Patient Gender: F                  HR:           79 bpm. Exam Location:  Inpatient Procedure: 2D Echo, Cardiac Doppler and Color Doppler Indications:    Afib  History:        Patient has no prior history of Echocardiogram examinations.  Sonographer:    Glo Herring Referring Phys: [place]  1. Left ventricular ejection fraction, by estimation, is  55 to 60%. The left ventricle has normal function. The left ventricle has no regional wall motion abnormalities. There is mild left ventricular hypertrophy. Left ventricular diastolic parameters were normal.  2. Right ventricular systolic function is normal. The right ventricular size is normal.  3. The mitral valve is normal in structure. Mild mitral valve regurgitation.  4. The aortic valve is tricuspid. Aortic valve regurgitation is not visualized. Aortic valve sclerosis is present, with no evidence of aortic valve stenosis.  5. The inferior vena cava is dilated in size with <50% respiratory variability, suggesting right atrial pressure of 15 mmHg. FINDINGS  Left Ventricle: Left ventricular ejection fraction, by estimation, is 55 to 60%. The left ventricle has normal function. The left ventricle has no regional wall motion abnormalities. The left ventricular internal cavity size was normal in size. There is  mild left ventricular hypertrophy. Left ventricular diastolic parameters were normal. Right Ventricle: The right ventricular size is normal. Right vetricular wall thickness was not assessed. Right ventricular systolic function is normal. Left Atrium: Left atrial size was normal in size. Right  Atrium: Right atrial size was normal in size. Pericardium: There is no evidence of pericardial effusion. Mitral Valve: The mitral valve is normal in structure. Mild mitral valve regurgitation. Tricuspid Valve: The tricuspid valve is normal in structure. Tricuspid valve regurgitation is mild. Aortic Valve: The aortic valve is tricuspid. Aortic valve regurgitation is not visualized. Aortic valve sclerosis is present, with no evidence of aortic valve stenosis. Aortic valve mean gradient measures 4.0 mmHg. Aortic valve peak gradient measures 6.2  mmHg. Aortic valve area, by VTI measures 2.03 cm. Pulmonic Valve: The pulmonic valve was normal in structure. Pulmonic valve regurgitation is mild. Aorta: The aortic root and ascending aorta are structurally normal, with no evidence of dilitation. Venous: The inferior vena cava is dilated in size with less than 50% respiratory variability, suggesting right atrial pressure of 15 mmHg. IAS/Shunts: The interatrial septum was not assessed.  LEFT VENTRICLE PLAX 2D LVIDd:         4.20 cm   Diastology LVIDs:         2.90 cm   LV e' medial:    6.96 cm/s LV PW:         0.90 cm   LV E/e' medial:  13.0 LV IVS:        1.20 cm   LV e' lateral:   7.07 cm/s LVOT diam:     2.00 cm   LV E/e' lateral: 12.8 LV SV:         62 LV SV Index:   33 LVOT Area:     3.14 cm  RIGHT VENTRICLE             IVC RV Basal diam:  3.20 cm     IVC diam: 2.30 cm RV S prime:     10.30 cm/s LEFT ATRIUM             Index        RIGHT ATRIUM           Index LA diam:        4.00 cm 2.14 cm/m   RA Area:     17.10 cm LA Vol (A2C):   41.1 ml 22.02 ml/m  RA Volume:   39.80 ml  21.33 ml/m LA Vol (A4C):   55.6 ml 29.79 ml/m LA Biplane Vol: 47.8 ml 25.61 ml/m  AORTIC VALVE  PULMONIC VALVE AV Area (Vmax):    2.31 cm     PV Vmax:       0.78 m/s AV Area (Vmean):   2.16 cm     PV Peak grad:  2.4 mmHg AV Area (VTI):     2.03 cm AV Vmax:           125.00 cm/s AV Vmean:          90.600 cm/s AV VTI:             0.305 m AV Peak Grad:      6.2 mmHg AV Mean Grad:      4.0 mmHg LVOT Vmax:         92.00 cm/s LVOT Vmean:        62.200 cm/s LVOT VTI:          0.197 m LVOT/AV VTI ratio: 0.65  AORTA Ao Root diam: 2.80 cm Ao Asc diam:  3.30 cm MITRAL VALVE MV Area (PHT): 4.06 cm    SHUNTS MV Decel Time: 187 msec    Systemic VTI:  0.20 m MV E velocity: 90.80 cm/s  Systemic Diam: 2.00 cm MV A velocity: 78.00 cm/s MV E/A ratio:  1.16 Dorris Carnes MD Electronically signed by Dorris Carnes MD Signature Date/Time: 07/11/2021/12:59:12 PM    Final    CT Renal Stone Study  Result Date: 07/09/2021 CLINICAL DATA:  Right flank pain EXAM: CT ABDOMEN AND PELVIS WITHOUT CONTRAST TECHNIQUE: Multidetector CT imaging of the abdomen and pelvis was performed following the standard protocol without IV contrast. COMPARISON:  None. FINDINGS: Lower chest: No acute abnormality. Hepatobiliary: No focal liver abnormality is seen. No gallstones, gallbladder wall thickening, or biliary dilatation. Pancreas: Atrophic with no suspicious mass or ductal dilatation visualized. Spleen: Normal in size without focal abnormality. Adrenals/Urinary Tract: Adrenal glands appear normal. 2 mm nonobstructing calculus in the upper pole the left kidney. 5 mm calculus in the lower pole right kidney which is near the renal pelvis with asymmetric distention of the inferior pole calices. No ureteral calculi or frank hydronephrosis otherwise. There is perinephric fat stranding around the inferior pole the right kidney extending inferiorly in the retroperitoneum. Urinary bladder appears within normal limits. Stomach/Bowel: Intraluminal fluid in the visualized distal esophagus. Small hiatal hernia. No bowel obstruction, free air or pneumatosis. Colonic diverticulosis. No bowel wall edema. Appendix is normal. Vascular/Lymphatic: No significant vascular findings are present. No enlarged abdominal or pelvic lymph nodes. Reproductive: Status post hysterectomy. No adnexal masses. Other: No  ascites. Musculoskeletal: No suspicious bony lesions. IMPRESSION: 1. 5 mm calculus in the lower pole right kidney which appears to obstruct the lower pole calices. Associated perinephric and retroperitoneal fat stranding around the lower right kidney and inferiorly. 2. Small left renal calculus. 3. Colonic diverticulosis. 4. Small hiatal hernia. Fluid in the visualized distal esophagus, correlate for reflux. Electronically Signed   By: Ofilia Neas M.D.   On: 07/09/2021 10:23   IR NEPHROSTOMY PLACEMENT RIGHT  Result Date: 07/10/2021 INDICATION: 76 year old female presents to the hospital with urosepsis secondary 2 a stone in the lower pole collecting system infundibulum, with local pelvicaliectasis of the lower pole. She has been referred for attempt at PCN. EXAM: IMAGE GUIDED PERCUTANEOUS NEPHROSTOMY COMPARISON:  CT 07/09/2021 MEDICATIONS: Ciprofloxacin 400 mg IV; The antibiotic was administered in an appropriate time frame prior to skin puncture. ANESTHESIA/SEDATION: Moderate (conscious) sedation was employed during this procedure. A total of Versed 3.5 mg and Fentanyl 200 mcg was administered intravenously by the  radiology nurse. Total intra-service moderate Sedation Time: 98 minutes. The patient's level of consciousness and vital signs were monitored continuously by radiology nursing throughout the procedure under my direct supervision. CONTRAST:  80 cc-administered into the collecting system(s) FLUOROSCOPY TIME:  Fluoroscopy Time: 41 minutes 24 seconds (599 mGy). COMPLICATIONS: SIR LEVEL B - Normal therapy, includes overnight admission for observation. Intravasation of contrast at the conclusion of the case, with bacteremia/rigors PROCEDURE: Informed written consent was obtained from the patient after a thorough discussion of the procedural risks, benefits and alternatives. All questions were addressed. Maximal Sterile Barrier Technique was utilized including caps, mask, sterile gowns, sterile gloves,  sterile drape, hand hygiene and skin antiseptic. A timeout was performed prior to the initiation of the procedure. Patient positioned prone position on the fluoroscopy table. Ultrasound survey of the right flank was performed with images stored and sent to PACs. The patient was then prepped and draped in the usual sterile fashion. 1% lidocaine was used to anesthetize the skin and subcutaneous tissues for local anesthesia. An Envy/chiba needle was then used in attempt to access the mildly dilated posterior inferior calyx with ultrasound guidance. After 3 attempts, we elected to forego ultrasound. A second needle puncture was then performed, targeting the infundibulum stone with a second needle from an AP approach. 1% lidocaine was used for local anesthesia, and the needle was then used to puncture the infundibulum at the site of the stone. Contrast opacification was performed. The second needle was then used in attempt to access the opacified lower pole and most posterior calyx. On the initial needle puncture, the needle tip was within the lower pole posterior calyx, however, the initial angulation was not favorable for passing the wire. A second more inferior needle approach was then performed, for a favorable angle into the calyx. On this second needle puncture the wire was passed into the lower pole collecting system calyx, though would not traverse the region the impacted stone. The Envy tri-axial system was then advanced over the wire into the collecting system under fluoroscopy. The metal stiffener and inner dilator were removed, and contrast was injected. Contrast was clearly not within the collecting system/lower pole calyx on this passage of the triaxial system. The 4 French sheath was removed. At this point, there was not a needle within the collecting system, and another puncture of the stone was attempted. From here forward, all of the contrast was injected demonstrated some element of venous  intravasation. The same needle trajectory from a caudal right posterior oblique approach was performed into the collapsed collecting system. Multiple needle punctures were performed, with only partial opacification. Ultimately the end the triaxial system was again past, which was only partially within the collapse collecting system. Multiple attempts of passing a 018 wire was performed, ultimately not within the lower pole calyx. The patient began to experience rigors after approximately 1 hour and 30 minutes of working time, 41 minutes of fluoroscopy time. We aborted given that there was a clear venous communication with the disrupted lower pole calyx. 30 cc estimated blood loss. IMPRESSION: Status post image guided attempt at a percutaneous nephrostomy of the isolated obstruction of the right lower pole calyx secondary to impacted infundibular stone. Ultimately, we were unable to place the catheter in the small space. Signed, Dulcy Fanny. Dellia Nims, RPVI [facility] Radiology Electronically Signed   By: Corrie Mckusick D.O.   On: 07/10/2021 18:03   VAS [place] LOWER EXTREMITY VENOUS (DVT)  Result Date:  07/10/2021  Lower Venous DVT Study Patient Name:  WINSLOW VERRILL  Date of Exam:   07/10/2021 Medical Rec #: [id number]           Accession #:    [id number] Date of Birth: [birth year]           Patient Gender: F Patient Age:   67 years Exam Location:  [facility] Procedure:      VAS [place] LOWER EXTREMITY VENOUS (DVT) Referring Phys: Jolaine Artist Mutasim Tuckey --------------------------------------------------------------------------------  Indications: Pain.  Risk Factors: None identified. Comparison Study: No prior studies. Performing Technologist: Oliver Hum RVT  Examination Guidelines: A complete evaluation includes B-mode imaging, spectral Doppler, color Doppler, and power Doppler as needed of all accessible portions of each vessel. Bilateral testing is considered an  integral part of a complete examination. Limited examinations for reoccurring indications may be performed as noted. The reflux portion of the exam is performed with the patient in reverse Trendelenburg.  +---------+---------------+---------+-----------+----------+--------------+  RIGHT     Compressibility Phasicity Spontaneity Properties Thrombus Aging  +---------+---------------+---------+-----------+----------+--------------+  CFV       Full            Yes       Yes                                    +---------+---------------+---------+-----------+----------+--------------+  SFJ       Full                                                             +---------+---------------+---------+-----------+----------+--------------+  FV Prox   Full                                                             +---------+---------------+---------+-----------+----------+--------------+  FV Mid    Full                                                             +---------+---------------+---------+-----------+----------+--------------+  FV Distal Full                                                             +---------+---------------+---------+-----------+----------+--------------+  PFV       Full                                                             +---------+---------------+---------+-----------+----------+--------------+  POP       Full  Yes       Yes                                    +---------+---------------+---------+-----------+----------+--------------+  PTV       Full                                                             +---------+---------------+---------+-----------+----------+--------------+  PERO      Full                                                             +---------+---------------+---------+-----------+----------+--------------+   +----+---------------+---------+-----------+----------+--------------+  LEFT Compressibility Phasicity Spontaneity Properties Thrombus Aging   +----+---------------+---------+-----------+----------+--------------+  CFV  Full            Yes       Yes                                    +----+---------------+---------+-----------+----------+--------------+     Summary: RIGHT: - There is no evidence of deep vein thrombosis in the lower extremity.  - No cystic structure found in the popliteal fossa.  LEFT: - No evidence of common femoral vein obstruction.  *See table(s) above for measurements and observations. Electronically signed by Orlie Pollen on 07/10/2021 at 6:58:08 PM.    Final       Subjective: Not had any further abdominal pain after starting on tamsulosin yesterday.  She has had some hematuria.  Discharge Exam: Vitals:   07/12/21 2217 07/13/21 0218 07/13/21 0543 07/13/21 1318  BP: (!) 142/74 132/71 138/90 (!) 142/84  Pulse: 99 86 (!) 101 86  Resp: 18 14 18 17   Temp: 98.4 F (36.9 C) 98.1 F (36.7 C) 98 F (36.7 C) 98.9 F (37.2 C)  TempSrc: Oral Oral Oral Oral  SpO2: 96% 92% 96% 96%  Weight:      Height:        General: Pt is alert, awake, not in acute distress Cardiovascular: RRR, S1/S2 +, no rubs, no gallops Respiratory: CTA bilaterally, no wheezing, no rhonchi Abdominal: Soft, NT, ND, bowel sounds + Extremities: no edema, no cyanosis    The results of significant diagnostics from this hospitalization (including imaging, microbiology, ancillary and laboratory) are listed below for reference.     Microbiology: Recent Results (from the past 240 hour(s))  Urine Culture     Status: None   Collection Time: 07/09/21 11:43 AM   Specimen: Urine, Clean Catch  Result Value Ref Range Status   Specimen Description   Final    URINE, CLEAN CATCH Performed at [facility], [address], [place], [place] [address]    Special Requests   Final    NONE Performed at [facility], [address], [place], [place] [address]    Culture   Final    NO GROWTH Performed at [facility], [place] [address] [place], [place] [address]    Report Status 07/10/2021  FINAL  Final  Culture, blood (routine x 2)     Status: Abnormal   Collection Time: 07/09/21 11:46 AM   Specimen: Left Antecubital; Blood  Result Value Ref Range Status   Specimen Description   Final    LEFT ANTECUBITAL Performed at [facility], [address], [place], [place] [address]    Special Requests   Final    BOTTLES DRAWN AEROBIC AND ANAEROBIC Blood Culture adequate volume Performed at [facility], [place], [place] [address]    Culture  Setup Time   Final    GRAM NEGATIVE RODS IN BOTH AEROBIC AND ANAEROBIC BOTTLES CRITICAL RESULT CALLED TO, READ BACK BY AND VERIFIED WITH: M BELL,[id number]  07/10/21 [place] Performed at [facility], [place] [address] [place], [place] [address]    Culture ESCHERICHIA COLI (A)  Final   Report Status 07/12/2021 FINAL  Final   Organism ID, Bacteria ESCHERICHIA COLI  Final      Susceptibility   Escherichia coli - MIC*    AMPICILLIN <=2 SENSITIVE Sensitive     CEFAZOLIN <=4 SENSITIVE Sensitive     CEFEPIME <=0.12 SENSITIVE Sensitive     CEFTAZIDIME <=1 SENSITIVE Sensitive     CEFTRIAXONE <=0.25 SENSITIVE Sensitive     CIPROFLOXACIN <=0.25 SENSITIVE Sensitive     GENTAMICIN <=1 SENSITIVE Sensitive     IMIPENEM <=0.25 SENSITIVE Sensitive     TRIMETH/SULFA <=20 SENSITIVE Sensitive     AMPICILLIN/SULBACTAM <=2 SENSITIVE Sensitive     PIP/TAZO <=4 SENSITIVE Sensitive     * ESCHERICHIA COLI  Culture, blood (routine x 2)     Status: Abnormal   Collection Time: 07/09/21 11:46 AM   Specimen: BLOOD RIGHT HAND  Result Value Ref Range Status   Specimen Description   Final    BLOOD RIGHT HAND Performed at [facility], [address] [place], [place] [address]    Special Requests   Final    BOTTLES DRAWN AEROBIC AND ANAEROBIC Blood Culture results may not be optimal due to an excessive  volume of blood received in culture bottles Performed at [facility], [address], [place], [place] [address]    Culture  Setup Time   Final    GRAM NEGATIVE RODS IN BOTH AEROBIC AND ANAEROBIC BOTTLES CRITICAL VALUE NOTED.  VALUE IS CONSISTENT WITH PREVIOUSLY REPORTED AND CALLED VALUE.    Culture (A)  Final    ESCHERICHIA COLI SUSCEPTIBILITIES PERFORMED ON PREVIOUS CULTURE WITHIN THE LAST 5 DAYS. Performed at [facility], [place] [address], [place],  [address]    Report Status 07/12/2021 FINAL  Final  Blood Culture ID Panel (Reflexed)     Status: Abnormal   Collection Time: 07/09/21 11:46 AM  Result Value Ref Range Status   Enterococcus faecalis NOT DETECTED NOT DETECTED Final   Enterococcus Faecium NOT DETECTED NOT DETECTED Final   Listeria monocytogenes NOT DETECTED NOT DETECTED Final   Staphylococcus species NOT DETECTED NOT DETECTED Final   Staphylococcus aureus (BCID) NOT DETECTED NOT DETECTED Final   Staphylococcus epidermidis NOT DETECTED NOT DETECTED Final   Staphylococcus lugdunensis NOT DETECTED NOT DETECTED Final   Streptococcus species NOT DETECTED NOT DETECTED Final   Streptococcus agalactiae NOT DETECTED NOT DETECTED Final   Streptococcus pneumoniae NOT DETECTED NOT DETECTED Final   Streptococcus pyogenes NOT DETECTED NOT DETECTED Final   A.calcoaceticus-baumannii NOT DETECTED NOT DETECTED Final   Bacteroides fragilis NOT DETECTED NOT DETECTED Final   Enterobacterales DETECTED (A)  NOT DETECTED Final    Comment: Enterobacterales represent a large order of gram negative bacteria, not a single organism. CRITICAL RESULT CALLED TO, READ BACK BY AND VERIFIED WITH: M BELL,PHARMD@0715  07/10/21 [place]    Enterobacter cloacae complex NOT DETECTED NOT DETECTED Final   Escherichia coli DETECTED (A) NOT DETECTED Final    Comment: CRITICAL RESULT CALLED TO, READ BACK BY AND VERIFIED WITH: M BELL,PHARMD@0715  07/10/21 [place]    Klebsiella aerogenes NOT  DETECTED NOT DETECTED Final   Klebsiella oxytoca NOT DETECTED NOT DETECTED Final   Klebsiella pneumoniae NOT DETECTED NOT DETECTED Final   Proteus species NOT DETECTED NOT DETECTED Final   Salmonella species NOT DETECTED NOT DETECTED Final   Serratia marcescens NOT DETECTED NOT DETECTED Final   Haemophilus influenzae NOT DETECTED NOT DETECTED Final   Neisseria meningitidis NOT DETECTED NOT DETECTED Final   Pseudomonas aeruginosa NOT DETECTED NOT DETECTED Final   Stenotrophomonas maltophilia NOT DETECTED NOT DETECTED Final   Candida albicans NOT DETECTED NOT DETECTED Final   Candida auris NOT DETECTED NOT DETECTED Final   Candida glabrata NOT DETECTED NOT DETECTED Final   Candida krusei NOT DETECTED NOT DETECTED Final   Candida parapsilosis NOT DETECTED NOT DETECTED Final   Candida tropicalis NOT DETECTED NOT DETECTED Final   Cryptococcus neoformans/gattii NOT DETECTED NOT DETECTED Final   CTX-M ESBL NOT DETECTED NOT DETECTED Final   Carbapenem resistance IMP NOT DETECTED NOT DETECTED Final   Carbapenem resistance KPC NOT DETECTED NOT DETECTED Final   Carbapenem resistance NDM NOT DETECTED NOT DETECTED Final   Carbapenem resist OXA 48 LIKE NOT DETECTED NOT DETECTED Final   Carbapenem resistance VIM NOT DETECTED NOT DETECTED Final    Comment: Performed at [facility], [address], [place], [place] [address]  Resp Panel by RT-PCR (Flu A&B, Covid) Nasopharyngeal Swab     Status: None   Collection Time: 07/09/21  3:20 PM   Specimen: Nasopharyngeal Swab; Nasopharyngeal(NP) swabs in vial transport medium  Result Value Ref Range Status   SARS Coronavirus 2 by RT PCR NEGATIVE NEGATIVE Final    Comment: (NOTE) SARS-CoV-2 target nucleic acids are NOT DETECTED.  The SARS-CoV-2 RNA is generally detectable in upper respiratory specimens during the acute phase of infection. The lowest concentration of SARS-CoV-2 viral copies this assay can detect is 138 copies/mL. A negative result does  not preclude SARS-Cov-2 infection and should not be used as the sole basis for treatment or other patient management decisions. A negative result may occur with  improper specimen collection/handling, submission of specimen other than nasopharyngeal swab, presence of viral mutation(s) within the areas targeted by this assay, and inadequate number of viral copies(<138 copies/mL). A negative result must be combined with clinical observations, patient history, and epidemiological information. The expected result is Negative.  Fact Sheet for Patients:  EntrepreneurPulse.com.au  Fact Sheet for Healthcare Providers:  IncredibleEmployment.be  This test is no t yet approved or cleared by the [place] FDA and  has been authorized for detection and/or diagnosis of SARS-CoV-2 by FDA under an Emergency Use Authorization (EUA). This EUA will remain  in effect (meaning this test can be used) for the duration of the COVID-19 declaration under Section 564(b)(1) of the Act, 21 U.S.C.section 360bbb-3(b)(1), unless the authorization is terminated  or revoked sooner.       Influenza A by PCR NEGATIVE NEGATIVE Final   Influenza B by PCR NEGATIVE NEGATIVE Final    Comment: (NOTE) The Xpert Xpress SARS-CoV-2/FLU/RSV plus assay is  intended as an aid in the diagnosis of influenza from Nasopharyngeal swab specimens and should not be used as a sole basis for treatment. Nasal washings and aspirates are unacceptable for Xpert Xpress SARS-CoV-2/FLU/RSV testing.  Fact Sheet for Patients: EntrepreneurPulse.com.au  Fact Sheet for Healthcare Providers: IncredibleEmployment.be  This test is not yet approved or cleared by the [place] FDA and has been authorized for detection and/or diagnosis of SARS-CoV-2 by FDA under an Emergency Use Authorization (EUA). This EUA will remain in effect (meaning this test can be used) for the  duration of the COVID-19 declaration under Section 564(b)(1) of the Act, 21 U.S.C. section 360bbb-3(b)(1), unless the authorization is terminated or revoked.  Performed at [facility], [address], [place], [place] [address]   MRSA Next Gen by PCR, Nasal     Status: None   Collection Time: 07/09/21  5:34 PM   Specimen: Nasal Mucosa; Nasal Swab  Result Value Ref Range Status   MRSA by PCR Next Gen NOT DETECTED NOT DETECTED Final    Comment: (NOTE) The GeneXpert MRSA Assay (FDA approved for NASAL specimens only), is one component of a comprehensive MRSA colonization surveillance program. It is not intended to diagnose MRSA infection nor to guide or monitor treatment for MRSA infections. Test performance is not FDA approved in patients less than 58 years old. Performed at [facility], [place] [address] [place], [place] [address]   Culture, blood (routine x 2)     Status: None (Preliminary result)   Collection Time: 07/10/21  6:39 PM   Specimen: BLOOD  Result Value Ref Range Status   Specimen Description   Final    BLOOD RIGHT ANTECUBITAL Performed at [facility] [address] [place], [place] [address]    Special Requests   Final    Blood Culture adequate volume BOTTLES DRAWN AEROBIC AND ANAEROBIC Performed at [facility] [address] [place], [place] [address]    Culture   Final    NO GROWTH 2 DAYS Performed at [facility] [address] [place], [place] [address]    Report Status PENDING  Incomplete  Culture, blood (routine x 2)     Status: None (Preliminary result)   Collection Time: 07/10/21  6:39 PM   Specimen: BLOOD  Result Value Ref Range Status   Specimen Description   Final    BLOOD LEFT ANTECUBITAL Performed at [facility] [address] [place], [place] [address]    Special Requests   Final    BOTTLES DRAWN AEROBIC ONLY Blood Culture results  may not be optimal due to an inadequate volume of blood received in culture bottles Performed at [facility] [address] [place], [place] [address]    Culture   Final    NO GROWTH 2 DAYS Performed at [facility] [address] [place], [place] [address]    Report Status PENDING  Incomplete     Labs: BNP (last 3 results) No results for input(s): BNP in the last 8760 hours. Basic Metabolic Panel: Recent Labs  Lab 07/09/21 1000 07/10/21 0549 07/11/21 0259  NA 133* 138 135  K 3.6 3.8 3.6  CL 102 110 107  CO2 16* 20* 23  GLUCOSE 181* 112* 98  BUN 18 18 14   CREATININE 1.17* 0.85 0.81  CALCIUM 9.3 8.8* 8.6*   Liver Function Tests: Recent Labs  Lab 07/09/21 1000 07/10/21 0549  AST 22  27  ALT 19 22  ALKPHOS 86 48  BILITOT 2.5* 1.7*  PROT 7.2 5.9*  ALBUMIN 3.9 2.8*   Recent Labs  Lab 07/09/21 1000  LIPASE <10*   No results for input(s): AMMONIA in the last 168 hours. CBC: Recent Labs  Lab 07/09/21 1000 07/10/21 0549 07/11/21 0259 07/12/21 0255 07/13/21 1117  WBC 4.7 12.4* 9.9 9.0 8.6  HGB 14.2 11.8* 10.6* 11.2* 11.4*  HCT 41.4 35.3* 32.0* 33.5* 33.7*  MCV 92.0 95.4 94.4 94.9 93.1  PLT 149* 126* 113* 139* 184   Cardiac Enzymes: No results for input(s): CKTOTAL, CKMB, CKMBINDEX, TROPONINI in the last 168 hours. BNP: Invalid input(s): POCBNP CBG: No results for input(s): GLUCAP in the last 168 hours. D-Dimer No results for input(s): DDIMER in the last 72 hours. Hgb A1c No results for input(s): HGBA1C in the last 72 hours. Lipid Profile No results for input(s): CHOL, HDL, LDLCALC, TRIG, CHOLHDL, LDLDIRECT in the last 72 hours. Thyroid function studies No results for input(s): TSH, T4TOTAL, T3FREE, THYROIDAB in the last 72 hours.  Invalid input(s): FREET3 Anemia work up No results for input(s): VITAMINB12, FOLATE, FERRITIN, TIBC, IRON, RETICCTPCT in the last 72 hours. Urinalysis    Component Value Date/Time    COLORURINE YELLOW 07/09/2021 1340   APPEARANCEUR HAZY (A) 07/09/2021 1340   LABSPEC 1.020 07/09/2021 1340   PHURINE 5.5 07/09/2021 1340   GLUCOSEU 100 (A) 07/09/2021 1340   HGBUR LARGE (A) 07/09/2021 1340   BILIRUBINUR NEGATIVE 07/09/2021 1340   BILIRUBINUR neg 11/30/2019 1338   KETONESUR 15 (A) 07/09/2021 1340   PROTEINUR 30 (A) 07/09/2021 1340   UROBILINOGEN 0.2 11/30/2019 1338   NITRITE NEGATIVE 07/09/2021 1340   LEUKOCYTESUR LARGE (A) 07/09/2021 1340   Sepsis Labs Invalid input(s): PROCALCITONIN,  WBC,  LACTICIDVEN Microbiology Recent Results (from the past 240 hour(s))  Urine Culture     Status: None   Collection Time: 07/09/21 11:43 AM   Specimen: Urine, Clean Catch  Result Value Ref Range Status   Specimen Description   Final    URINE, CLEAN CATCH Performed at [facility], [address], [place], [place] [address]    Special Requests   Final    NONE Performed at [facility], [address], [place], [place] [address]    Culture   Final    NO GROWTH Performed at [facility], [place] [address] [place], [place] [address]    Report Status 07/10/2021 FINAL  Final  Culture, blood (routine x 2)     Status: Abnormal   Collection Time: 07/09/21 11:46 AM   Specimen: Left Antecubital; Blood  Result Value Ref Range Status   Specimen Description   Final    LEFT ANTECUBITAL Performed at [facility], [address] [place], [place] [address]    Special Requests   Final    BOTTLES DRAWN AEROBIC AND ANAEROBIC Blood Culture adequate volume Performed at [facility], [place], [place] [address]    Culture  Setup Time   Final    GRAM NEGATIVE RODS IN BOTH AEROBIC AND ANAEROBIC BOTTLES CRITICAL RESULT CALLED TO, READ BACK BY AND VERIFIED WITH: M BELL,PHARMD@0716  07/10/21 [place] Performed at [facility], [place] [address], [place], [place]    Culture ESCHERICHIA  COLI (A)  Final   Report Status 07/12/2021 FINAL  Final   Organism ID, Bacteria ESCHERICHIA COLI  Final      Susceptibility   Escherichia coli -  MIC*    AMPICILLIN <=2 SENSITIVE Sensitive     CEFAZOLIN <=4 SENSITIVE Sensitive     CEFEPIME <=0.12 SENSITIVE Sensitive     CEFTAZIDIME <=1 SENSITIVE Sensitive     CEFTRIAXONE <=0.25 SENSITIVE Sensitive     CIPROFLOXACIN <=0.25 SENSITIVE Sensitive     GENTAMICIN <=1 SENSITIVE Sensitive     IMIPENEM <=0.25 SENSITIVE Sensitive     TRIMETH/SULFA <=20 SENSITIVE Sensitive     AMPICILLIN/SULBACTAM <=2 SENSITIVE Sensitive     PIP/TAZO <=4 SENSITIVE Sensitive     * ESCHERICHIA COLI  Culture, blood (routine x 2)     Status: Abnormal   Collection Time: 07/09/21 11:46 AM   Specimen: BLOOD RIGHT HAND  Result Value Ref Range Status   Specimen Description   Final    BLOOD RIGHT HAND Performed at [facility], [address], [place], [place] [address]    Special Requests   Final    BOTTLES DRAWN AEROBIC AND ANAEROBIC Blood Culture results may not be optimal due to an excessive volume of blood received in culture bottles Performed at [facility], [address] [place], [place] [address]    Culture  Setup Time   Final    GRAM NEGATIVE RODS IN BOTH AEROBIC AND ANAEROBIC BOTTLES CRITICAL VALUE NOTED.  VALUE IS CONSISTENT WITH PREVIOUSLY REPORTED AND CALLED VALUE.    Culture (A)  Final    ESCHERICHIA COLI SUSCEPTIBILITIES PERFORMED ON PREVIOUS CULTURE WITHIN THE LAST 5 DAYS. Performed at [facility], [place] [address] [place],  [address]    Report Status 07/12/2021 FINAL  Final  Blood Culture ID Panel (Reflexed)     Status: Abnormal   Collection Time: 07/09/21 11:46 AM  Result Value Ref Range Status   Enterococcus faecalis NOT DETECTED NOT DETECTED Final   Enterococcus Faecium NOT DETECTED NOT DETECTED Final   Listeria monocytogenes NOT DETECTED NOT DETECTED Final   Staphylococcus species NOT  DETECTED NOT DETECTED Final   Staphylococcus aureus (BCID) NOT DETECTED NOT DETECTED Final   Staphylococcus epidermidis NOT DETECTED NOT DETECTED Final   Staphylococcus lugdunensis NOT DETECTED NOT DETECTED Final   Streptococcus species NOT DETECTED NOT DETECTED Final   Streptococcus agalactiae NOT DETECTED NOT DETECTED Final   Streptococcus pneumoniae NOT DETECTED NOT DETECTED Final   Streptococcus pyogenes NOT DETECTED NOT DETECTED Final   A.calcoaceticus-baumannii NOT DETECTED NOT DETECTED Final   Bacteroides fragilis NOT DETECTED NOT DETECTED Final   Enterobacterales DETECTED (A) NOT DETECTED Final    Comment: Enterobacterales represent a large order of gram negative bacteria, not a single organism. CRITICAL RESULT CALLED TO, READ BACK BY AND VERIFIED WITH: M BELL,PHARMD@0715  07/10/21 [place]    Enterobacter cloacae complex NOT DETECTED NOT DETECTED Final   Escherichia coli DETECTED (A) NOT DETECTED Final    Comment: CRITICAL RESULT CALLED TO, READ BACK BY AND VERIFIED WITH: M BELL,PHARMD@0715  07/10/21 [place]    Klebsiella aerogenes NOT DETECTED NOT DETECTED Final   Klebsiella oxytoca NOT DETECTED NOT DETECTED Final   Klebsiella pneumoniae NOT DETECTED NOT DETECTED Final   Proteus species NOT DETECTED NOT DETECTED Final   Salmonella species NOT DETECTED NOT DETECTED Final   Serratia marcescens NOT DETECTED NOT DETECTED Final   Haemophilus influenzae NOT DETECTED NOT DETECTED Final   Neisseria meningitidis NOT DETECTED NOT DETECTED Final   Pseudomonas aeruginosa NOT DETECTED NOT DETECTED Final   Stenotrophomonas maltophilia NOT DETECTED NOT DETECTED Final   Candida albicans NOT DETECTED NOT DETECTED Final   Candida auris  NOT DETECTED NOT DETECTED Final   Candida glabrata NOT DETECTED NOT DETECTED Final   Candida krusei NOT DETECTED NOT DETECTED Final   Candida parapsilosis NOT DETECTED NOT DETECTED Final   Candida tropicalis NOT DETECTED NOT DETECTED Final   Cryptococcus  neoformans/gattii NOT DETECTED NOT DETECTED Final   CTX-M ESBL NOT DETECTED NOT DETECTED Final   Carbapenem resistance IMP NOT DETECTED NOT DETECTED Final   Carbapenem resistance KPC NOT DETECTED NOT DETECTED Final   Carbapenem resistance NDM NOT DETECTED NOT DETECTED Final   Carbapenem resist OXA 48 LIKE NOT DETECTED NOT DETECTED Final   Carbapenem resistance VIM NOT DETECTED NOT DETECTED Final    Comment: Performed at [facility], [place] [address], [place], [place] [address]  Resp Panel by RT-PCR (Flu A&B, Covid) Nasopharyngeal Swab     Status: None   Collection Time: 07/09/21  3:20 PM   Specimen: Nasopharyngeal Swab; Nasopharyngeal(NP) swabs in vial transport medium  Result Value Ref Range Status   SARS Coronavirus 2 by RT PCR NEGATIVE NEGATIVE Final    Comment: (NOTE) SARS-CoV-2 target nucleic acids are NOT DETECTED.  The SARS-CoV-2 RNA is generally detectable in upper respiratory specimens during the acute phase of infection. The lowest concentration of SARS-CoV-2 viral copies this assay can detect is 138 copies/mL. A negative result does not preclude SARS-Cov-2 infection and should not be used as the sole basis for treatment or other patient management decisions. A negative result may occur with  improper specimen collection/handling, submission of specimen other than nasopharyngeal swab, presence of viral mutation(s) within the areas targeted by this assay, and inadequate number of viral copies(<138 copies/mL). A negative result must be combined with clinical observations, patient history, and epidemiological information. The expected result is Negative.  Fact Sheet for Patients:  EntrepreneurPulse.com.au  Fact Sheet for Healthcare Providers:  IncredibleEmployment.be  This test is no t yet approved or cleared by the [place] FDA and  has been authorized for detection and/or diagnosis of SARS-CoV-2 by FDA under an Emergency Use  Authorization (EUA). This EUA will remain  in effect (meaning this test can be used) for the duration of the COVID-19 declaration under Section 564(b)(1) of the Act, 21 U.S.C.section 360bbb-3(b)(1), unless the authorization is terminated  or revoked sooner.       Influenza A by PCR NEGATIVE NEGATIVE Final   Influenza B by PCR NEGATIVE NEGATIVE Final    Comment: (NOTE) The Xpert Xpress SARS-CoV-2/FLU/RSV plus assay is intended as an aid in the diagnosis of influenza from Nasopharyngeal swab specimens and should not be used as a sole basis for treatment. Nasal washings and aspirates are unacceptable for Xpert Xpress SARS-CoV-2/FLU/RSV testing.  Fact Sheet for Patients: EntrepreneurPulse.com.au  Fact Sheet for Healthcare Providers: IncredibleEmployment.be  This test is not yet approved or cleared by the [place] FDA and has been authorized for detection and/or diagnosis of SARS-CoV-2 by FDA under an Emergency Use Authorization (EUA). This EUA will remain in effect (meaning this test can be used) for the duration of the COVID-19 declaration under Section 564(b)(1) of the Act, 21 U.S.C. section 360bbb-3(b)(1), unless the authorization is terminated or revoked.  Performed at [facility], [address] [place], [place] [address]   MRSA Next Gen by PCR, Nasal     Status: None   Collection Time: 07/09/21  5:34 PM   Specimen: Nasal Mucosa; Nasal Swab  Result Value Ref Range Status   MRSA by PCR Next Gen NOT DETECTED NOT DETECTED Final  Comment: (NOTE) The GeneXpert MRSA Assay (FDA approved for NASAL specimens only), is one component of a comprehensive MRSA colonization surveillance program. It is not intended to diagnose MRSA infection nor to guide or monitor treatment for MRSA infections. Test performance is not FDA approved in patients less than 71 years old. Performed at [facility], [place]  [address], [place], [place] [address]   Culture, blood (routine x 2)     Status: None (Preliminary result)   Collection Time: 07/10/21  6:39 PM   Specimen: BLOOD  Result Value Ref Range Status   Specimen Description   Final    BLOOD RIGHT ANTECUBITAL Performed at [facility] [address], [place], [place] [address]    Special Requests   Final    Blood Culture adequate volume BOTTLES DRAWN AEROBIC AND ANAEROBIC Performed at [facility] [address], [place], [place] [address]    Culture   Final    NO GROWTH 2 DAYS Performed at [facility] [address], [place], [place] [address]    Report Status PENDING  Incomplete  Culture, blood (routine x 2)     Status: None (Preliminary result)   Collection Time: 07/10/21  6:39 PM   Specimen: BLOOD  Result Value Ref Range Status   Specimen Description   Final    BLOOD LEFT ANTECUBITAL Performed at [facility] [address] [place], [place] [address]    Special Requests   Final    BOTTLES DRAWN AEROBIC ONLY Blood Culture results may not be optimal due to an inadequate volume of blood received in culture bottles Performed at [facility] [address] [place],  [address]    Culture   Final    NO GROWTH 2 DAYS Performed at [facility] [address] [place],  [address]    Report Status PENDING  Incomplete     Time coordinating discharge: 35mins  SIGNED:   Kathie Dike, MD  [facility] 07/13/2021, 9:35 PM   If 7PM-7AM, please contact night-coverage [email or web address]

## 2021-07-14 ENCOUNTER — Telehealth: Payer: Self-pay

## 2021-07-14 NOTE — Telephone Encounter (Signed)
Pt states that she is debating whether to follow up or not.. she states that she will contact the office to make a hospital fu apt  if she decides to do so-  I attempted the TCM

## 2021-07-16 LAB — CULTURE, BLOOD (ROUTINE X 2)
Culture: NO GROWTH
Culture: NO GROWTH
Special Requests: ADEQUATE

## 2021-07-17 ENCOUNTER — Ambulatory Visit (INDEPENDENT_AMBULATORY_CARE_PROVIDER_SITE_OTHER): Payer: Medicare HMO | Admitting: Family Medicine

## 2021-07-17 ENCOUNTER — Encounter: Payer: Self-pay | Admitting: Family Medicine

## 2021-07-17 VITALS — BP 118/78 | HR 87 | Temp 98.7°F | Wt 178.6 lb

## 2021-07-17 DIAGNOSIS — N200 Calculus of kidney: Secondary | ICD-10-CM

## 2021-07-17 DIAGNOSIS — I4891 Unspecified atrial fibrillation: Secondary | ICD-10-CM

## 2021-07-17 DIAGNOSIS — Z01818 Encounter for other preprocedural examination: Secondary | ICD-10-CM

## 2021-07-17 DIAGNOSIS — N12 Tubulo-interstitial nephritis, not specified as acute or chronic: Secondary | ICD-10-CM | POA: Diagnosis not present

## 2021-07-17 NOTE — Progress Notes (Signed)
No chief complaint on file.   HPI:  Patient is seen for optimization of general medical care prior to surgery. Surgery type: Cystoscopy/retrograde ureteroscopy/holmium laser/ R stent placement Date of surgery:07/27/21  Feeling a little tired 2/2 anemia but otherwise okay.  Appetite improving.  Hospitalized 07/09/2021 for E. coli sepsis 2/2 pyelonephritis possibly kidney stone.  IR attempt to place percutaneous nephrostomy unsuccessful.  Pt s/p uteroscopy with R stent placement during hospitalization.  Hypotension initially noted and improved with IV fluids.  Coreg initially held and titrated up to 6.25 mg twice daily for A. fib with RVR Eliquis restarted 24 hours after stent placement.  Echo with normal EF.  Kidney disease? No Prior surgeries/Issues following anesthesia? no Hx MI, heart arrythmia, CHF, angina or stroke? none Epilepsy or Seizures? none Arthritis or problems with neck or jaw? none Thyroid disease? none Liver disease? none Asthma, COPD or chronic lung disease? none Diabetes? none (Needs to be evaluated by anesthesia if yes to these questions.)  H/o new onset Afib on Eliquis and coreg 6.25 mg BID, dx'd in Oct 2022.  Other: Poor nutrition, Frail or other: no  METS:  ?Can take care of self, such as eat, dress, or use the toilet (1 MET). yes ?Can walk up a flight of steps or a hill (4 METs).yes ?Can do heavy work around the house such as scrubbing floors or lifting or moving heavy furniture (between 4 and 10 METs). yes ?Can participate in strenuous sports such as swimming, singles tennis, football, basketball, and skiing (>10 METs) . AHA Risks: Major predictors that require intensive management and may lead to delay in or cancellation of the operative procedure unless emergent: NONE   Unstable coronary syndromes including unstable or severe angina or recent MI   Decompensated heart failure including NYHA functional class IV or worsening or new-onset HF   Significant  arrhythmias including high grade AV block, symptomatic ventricular arrhythmias, supraventricular arrhythmias with ventricular rate >100 bpm at rest, symptomatic bradycardia, and newly recognized ventricular tachycardia   Severe heart valve disease including severe aortic stenosis or symptomatic mitral stenosis   Other clinical predictors that warrant careful assessment of current status: NONE   History of ischemic heart disease  History of cerebrovascular disease   History of compensated heart failure or prior heart failure   Diabetes mellitus   Renal insufficiency  Type of surgery and Risk: 1) High risk (reported risk of cardiac death or nonfatal myocardial infarction [MI] often greater than 5 percent):   Aortic and other major vascular surgery   Peripheral artery surgery   2)Intermediate risk (reported risk of cardiac death or nonfatal MI generally 1 to 5 percent):   Carotid endarterectomy   Head and neck surgery   Intraperitoneal and intrathoracic surgery   Orthopedic surgery   Prostate surgery   3)Low risk (reported risk of cardiac death or nonfatal MI generally less than 1 percent):   Ambulatory surgery   Endoscopic procedures   Superficial procedure   Cataract surgery   Breast surgery  Medications that need to be addressed prior to surgery: None Discontinue acei/arbs/non-statin lipid lowering drugs day of surgery ASA stop 7 days before or discuss with cardiology if CV risks, other anticoagulants discuss with cardiology.   ROS: See pertinent positives and negatives per HPI. 11 point ROS negative except where noted.  Past Medical History:  Diagnosis Date   Depression    Diverticulosis    Kidney stones 2008   OA (osteoarthritis) of hip    Osteopenia  Shingles    Vitamin D deficiency     Past Surgical History:  Procedure Laterality Date   ABDOMINAL HYSTERECTOMY     BREAST REDUCTION SURGERY  1985   COLONOSCOPY  2005   CYSTOSCOPY W/ URETERAL STENT PLACEMENT Right  07/11/2021   Procedure: CYSTOSCOPY WITH RETROGRADE PYELOGRAM/URETERAL STENT PLACEMENT;  Surgeon: Ardis Hughs, MD;  Location: [facility];  Service: Urology;  Laterality: Right;   FRACTURE SURGERY Left 2020   dislocated left elbow    IR NEPHROSTOMY PLACEMENT RIGHT  07/10/2021   LITHOTRIPSY  2008    Family History  Problem Relation Age of Onset   Colon cancer Neg Hx    Esophageal cancer Neg Hx    Rectal cancer Neg Hx    Stomach cancer Neg Hx     Social History   Socioeconomic History   Marital status: Married    Spouse name: Not on file   Number of children: Not on file   Years of education: Not on file   Highest education level: Not on file  Occupational History   Not on file  Tobacco Use   Smoking status: Never   Smokeless tobacco: Never  Vaping Use   Vaping Use: Never used  Substance and Sexual Activity   Alcohol use: Yes    Comment: socially   Drug use: No   Sexual activity: Not on file  Other Topics Concern   Not on file  Social History Narrative   Not on file   Social Determinants of Health   Financial Resource Strain: Low Risk    Difficulty of Paying Living Expenses: Not hard at all  Food Insecurity: No Food Insecurity   Worried About Charity fundraiser in the Last Year: Never true   Ran Out of Food in the Last Year: Never true  Transportation Needs: No Transportation Needs   Lack of Transportation (Medical): No   Lack of Transportation (Non-Medical): No  Physical Activity: Inactive   Days of Exercise per Week: 0 days   Minutes of Exercise per Session: 0 min  Stress: No Stress Concern Present   Feeling of Stress : Not at all  Social Connections: Moderately Isolated   Frequency of Communication with Friends and Family: More than three times a week   Frequency of Social Gatherings with Friends and Family: Once a week   Attends Religious Services: Never   Marine scientist or Organizations: No   Attends Music therapist: Never    Marital Status: Married     Current Outpatient Medications:    acetaminophen (TYLENOL) 500 MG tablet, Take 1,000 mg by mouth every 8 (eight) hours as needed for moderate pain., Disp: , Rfl:    apixaban (ELIQUIS) 5 MG TABS tablet, Take 1 tablet (5 mg total) by mouth 2 (two) times daily., Disp: 60 tablet, Rfl: 11   carvedilol (COREG) 6.25 MG tablet, Take 1 tablet (6.25 mg total) by mouth 2 (two) times daily with a meal., Disp: 60 tablet, Rfl: 1   cefadroxil (DURICEF) 500 MG capsule, Take 2 capsules (1,000 mg total) by mouth 2 (two) times daily., Disp: 40 capsule, Rfl: 0   famotidine (PEPCID) 20 MG tablet, Take 20 mg by mouth daily. , Disp: , Rfl:    FLUoxetine (PROZAC) 40 MG capsule, TAKE 1 CAPSULE BY MOUTH EVERY DAY, Disp: 90 capsule, Rfl: 0   HYDROcodone-acetaminophen (NORCO/VICODIN) 5-325 MG tablet, Take 1 tablet by mouth every 6 (six) hours as needed for moderate pain., Disp:  7 tablet, Rfl: 0   prednisoLONE acetate (PRED FORTE) 1 % ophthalmic suspension, Place 1 drop into the left eye daily as needed (eye pain)., Disp: , Rfl:    valACYclovir (VALTREX) 1000 MG tablet, Take 1 tablet (1,000 mg total) by mouth daily., Disp: 90 tablet, Rfl: 3   polyethylene glycol (MIRALAX / GLYCOLAX) 17 g packet, Take 17 g by mouth daily. (Patient not taking: Reported on 07/17/2021), Disp: 30 each, Rfl: 0   tamsulosin (FLOMAX) 0.4 MG CAPS capsule, Take 1 capsule (0.4 mg total) by mouth daily. (Patient not taking: Reported on 07/17/2021), Disp: 30 capsule, Rfl: 0  Current Facility-Administered Medications:    0.9 %  sodium chloride infusion, 500 mL, Intravenous, Continuous, Nandigam, Kavitha V, MD  EXAM:  Vitals:   07/17/21 1109  BP: 118/78  Pulse: 87  Temp: 98.7 F (37.1 C)  SpO2: 97%    Body mass index is 28.83 kg/m.  GENERAL: vitals reviewed and listed above, alert, oriented, appears well hydrated, mildly fatigued,  and in no acute distress  HEENT: atraumatic, conjunttiva clear, no obvious  abnormalities on inspection of external nose and ears  NECK: no obvious masses on inspection, no carotid bruits  LUNGS: clear to auscultation bilaterally, no wheezes, rales or rhonchi, good air movement  CV: HRRR, no peripheral edema, no JVD, BP normal range, normal radial pulses  MS: moves all extremities without noticeable abnormality  PSYCH: pleasant and cooperative, no obvious depression or anxiety  ASSESSMENT AND PLAN:  Discussed the following assessment and plan:  Preop examination  Atrial fibrillation, unspecified type (HCC) -rate controlled -continue Coreg 6.25 mg BID -will need to hold eliquis 3 days prior to uroscopy and R stent placement  Renal calculi -removal planned 07/27/21 -Continue follow-up with urology  Pyelonephritis -Resolved  Assessment: -Risk factors: afib -Surgery Risks:intermediate -age, nutritional status, fraility: good nutritional status, age >42, no fraility -functional capacity: > 4 METs without symptoms -comorbidities: none Patient Specific Risks: patient is low risk for low risks surgery  Recommendations for optimizing general medical care prior to surgery: -advised patient to discuss specific risks morbidity and mortality of surgery with surgeon, CV risks discussed with patient -advised patient will defer to surgeon for post-op DVT prophylaxis and post op care -no specific medical recommendations for this patient at this time and no recommendations to defer surgery or for further CV testing prior to surgery -form for pre-op optimization of general medical care prior to surgery faxed to surgeon office  -Patient advised to return or notify a doctor immediately if symptoms worsen or persist or new concerns arise.  Billie Ruddy

## 2021-07-21 DIAGNOSIS — N200 Calculus of kidney: Secondary | ICD-10-CM | POA: Diagnosis not present

## 2021-07-22 NOTE — Progress Notes (Addendum)
COVID swab appointment: N/A  COVID Vaccine Completed: Yes x2 Date COVID Vaccine completed: 09-15-19 10-09-19 Has received booster: Yes x2 04-21-20  COVID vaccine manufacturer: Pfizer      Date of COVID positive in last 90 days:  No  PCP - Grier Mitts, MD Cardiologist - N/A  Chest x-ray - N/A EKG - 07-09-21 Epic Stress Test - N/A ECHO - 07-11-21 Epic Cardiac Cath - N/A Pacemaker/ICD device last checked: Spinal Cord Stimulator:  Sleep Study - N/A CPAP -   Fasting Blood Sugar - N/A Checks Blood Sugar _____ times a day  Blood Thinner Instructions: Eliquis.  Hold x3 days.  Patient aware Aspirin Instructions: Last Dose:  Activity level:   Can go up a flight of stairs and perform activities of daily living without stopping and without symptoms of chest pain or shortness of breath.  Patient is very weak from recent illness but states that each day she is feeling better.     Anesthesia review:  Afib (managed by PCP)  Patient denies shortness of breath, fever, cough and chest pain at PAT appointment  Patient verbalized understanding of instructions that were given to them at the PAT appointment. Patient was also instructed that they will need to review over the PAT instructions again at home before surgery.

## 2021-07-22 NOTE — Patient Instructions (Addendum)
DUE TO COVID-19 ONLY ONE VISITOR IS ALLOWED TO COME WITH YOU AND STAY IN THE WAITING ROOM ONLY DURING PRE OP AND PROCEDURE.   **NO VISITORS ARE ALLOWED IN THE SHORT STAY AREA OR RECOVERY ROOM!!**   You are not required to quarantine, however you are required to wear a well-fitted mask when you are out and around people not in your household.  Hand Hygiene often Do NOT share personal items Notify your provider if you are in close contact with someone who has COVID or you develop fever 100.4 or greater, new onset of sneezing, cough, sore throat, shortness of breath or body aches.   Your procedure is scheduled on: Monday, 07-27-21   Report to [facility] Main  Entrance     Report to admitting at 10:30 AM   Call this number if you have problems the morning of surgery [phone]   Do not eat food :After Midnight.   May have liquids until 9:30 AM day of surgery  CLEAR LIQUID DIET  Foods Allowed                                                                     Foods Excluded  Water, Black Coffee (no milk/no creamer) and tea, regular and decaf                              liquids that you cannot  Plain Jell-O in any flavor  (No red)                         see through such as: Fruit ices (not with fruit pulp)                                 milk, soups, orange juice  Iced Popsicles (No red)                                    All solid food                             Apple juices Sports drinks like Gatorade (No red) Lightly seasoned clear broth or consume(fat free) Sugar    Oral Hygiene is also important to reduce your risk of infection.                                    Remember - BRUSH YOUR TEETH THE MORNING OF SURGERY WITH YOUR REGULAR TOOTHPASTE   Do NOT smoke after Midnight   Take these medicines the morning of surgery with A SIP OF WATER: Tylenol, Carvediolol, Cefadroxil, Pepcid, Fluoxetine, Valtrex, Hydrocodone if needed                    Eliquis - hold x3 days  prior to surgery   Stop all vitamins and herbal supplements a week before surgery  You may not have any metal on your body including hair pins, jewelry, and body piercing             Do not wear make-up, lotions, powders, perfumes or deodorant  Do not wear nail polish including gel and S&S, artificial/acrylic nails, or any other type of covering on natural nails including finger and toenails. If you have artificial nails, gel coating, etc. that needs to be removed by a nail salon please have this removed prior to surgery or surgery may need to be canceled/ delayed if the surgeon/ anesthesia feels like they are unable to be safely monitored.   Do not shave  48 hours prior to surgery.   Do not bring valuables to the hospital. [place].   Contacts, dentures or bridgework may not be worn into surgery.   Patients discharged the day of surgery will not be allowed to drive home.   A responsible adult must remain with you for 24 hours after surgery.  Special Instructions: Bring a copy of your healthcare power of attorney and living will documents the day of surgery if you haven't scanned them in before.  Please read over the following fact sheets you were given: IF YOU HAVE QUESTIONS ABOUT YOUR PRE OP INSTRUCTIONS PLEASE CALL [facility] - Preparing for Surgery Before surgery, you can play an important role.  Because skin is not sterile, your skin needs to be as free of germs as possible.  You can reduce the number of germs on your skin by washing with CHG (chlorahexidine gluconate) soap before surgery.  CHG is an antiseptic cleaner which kills germs and bonds with the skin to continue killing germs even after washing. Please DO NOT use if you have an allergy to CHG or antibacterial soaps.  If your skin becomes reddened/irritated stop using the CHG and inform your nurse when you arrive at Short Stay. Do not shave (including legs  and underarms) for at least 48 hours prior to the first CHG shower.  You may shave your face/neck.  Please follow these instructions carefully:  1.  Shower with CHG Soap the night before surgery and the  morning of surgery.  2.  If you choose to wash your hair, wash your hair first as usual with your normal  shampoo.  3.  After you shampoo, rinse your hair and body thoroughly to remove the shampoo.                             4.  Use CHG as you would any other liquid soap.  You can apply chg directly to the skin and wash.  Gently with a scrungie or clean washcloth.  5.  Apply the CHG Soap to your body ONLY FROM THE NECK DOWN.   Do   not use on face/ open                           Wound or open sores. Avoid contact with eyes, ears mouth and   genitals (private parts).                       Wash face,  Genitals (private parts) with your normal soap.             6.  Wash thoroughly, paying special attention to the area where your  surgery  will be performed.  7.  Thoroughly rinse your body with warm water from the neck down.  8.  DO NOT shower/wash with your normal soap after using and rinsing off the CHG Soap.                9.  Pat yourself dry with a clean towel.            10.  Wear clean pajamas.            11.  Place clean sheets on your bed the night of your first shower and do not  sleep with pets. Day of Surgery : Do not apply any lotions/deodorants the morning of surgery.  Please wear clean clothes to the hospital/surgery center.  FAILURE TO FOLLOW THESE INSTRUCTIONS MAY RESULT IN THE CANCELLATION OF YOUR SURGERY  PATIENT SIGNATURE_________________________________  NURSE SIGNATURE__________________________________  ________________________________________________________________________

## 2021-07-23 ENCOUNTER — Encounter (HOSPITAL_COMMUNITY): Payer: Self-pay

## 2021-07-23 ENCOUNTER — Other Ambulatory Visit: Payer: Self-pay

## 2021-07-23 ENCOUNTER — Encounter (HOSPITAL_COMMUNITY)
Admission: RE | Admit: 2021-07-23 | Discharge: 2021-07-23 | Disposition: A | Discharge status: Home / Self Care | Payer: Medicare HMO | Source: Ambulatory Visit | Attending: Urology | Admitting: Urology

## 2021-07-23 VITALS — BP 125/78 | HR 96 | Temp 98.3°F | Resp 18 | Ht 66.0 in | Wt 172.6 lb

## 2021-07-23 DIAGNOSIS — I2510 Atherosclerotic heart disease of native coronary artery without angina pectoris: Secondary | ICD-10-CM | POA: Diagnosis not present

## 2021-07-23 DIAGNOSIS — Z01812 Encounter for preprocedural laboratory examination: Secondary | ICD-10-CM | POA: Diagnosis not present

## 2021-07-23 HISTORY — DX: Gastro-esophageal reflux disease without esophagitis: K21.9

## 2021-07-23 HISTORY — DX: Anemia, unspecified: D64.9

## 2021-07-23 HISTORY — DX: Unspecified atrial fibrillation: I48.91

## 2021-07-23 HISTORY — DX: Personal history of urinary calculi: Z87.442

## 2021-07-23 LAB — BASIC METABOLIC PANEL
Anion gap: 10 (ref 5–15)
BUN: 18 mg/dL (ref 8–23)
CO2: 22 mmol/L (ref 22–32)
Calcium: 10.1 mg/dL (ref 8.9–10.3)
Chloride: 105 mmol/L (ref 98–111)
Creatinine, Ser: 0.98 mg/dL (ref 0.44–1.00)
GFR, Estimated: >60 mL/min (ref >60)
Glucose, Bld: 111 mg/dL — ABNORMAL HIGH (ref 70–99)
Potassium: 4.1 mmol/L (ref 3.5–5.1)
Sodium: 137 mmol/L (ref 135–145)

## 2021-07-27 ENCOUNTER — Ambulatory Visit (HOSPITAL_COMMUNITY): Payer: Medicare HMO

## 2021-07-27 ENCOUNTER — Ambulatory Visit (HOSPITAL_COMMUNITY): Payer: Medicare HMO | Admitting: Anesthesiology

## 2021-07-27 ENCOUNTER — Encounter (HOSPITAL_COMMUNITY)
Admission: RE | Disposition: A | Discharge status: Home / Self Care | Payer: Self-pay | Source: Home / Self Care | Attending: Urology

## 2021-07-27 ENCOUNTER — Ambulatory Visit (HOSPITAL_COMMUNITY): Payer: Medicare HMO | Admitting: Physician Assistant

## 2021-07-27 ENCOUNTER — Ambulatory Visit (HOSPITAL_COMMUNITY)
Admission: RE | Admit: 2021-07-27 | Discharge: 2021-07-27 | Disposition: A | Discharge status: Home / Self Care | Payer: Medicare HMO | Attending: Urology | Admitting: Urology

## 2021-07-27 DIAGNOSIS — I4891 Unspecified atrial fibrillation: Secondary | ICD-10-CM | POA: Insufficient documentation

## 2021-07-27 DIAGNOSIS — N200 Calculus of kidney: Secondary | ICD-10-CM | POA: Diagnosis not present

## 2021-07-27 DIAGNOSIS — M1990 Unspecified osteoarthritis, unspecified site: Secondary | ICD-10-CM | POA: Diagnosis not present

## 2021-07-27 DIAGNOSIS — Z7901 Long term (current) use of anticoagulants: Secondary | ICD-10-CM | POA: Diagnosis not present

## 2021-07-27 DIAGNOSIS — Z466 Encounter for fitting and adjustment of urinary device: Secondary | ICD-10-CM | POA: Diagnosis not present

## 2021-07-27 HISTORY — PX: CYSTOSCOPY/URETEROSCOPY/HOLMIUM LASER/STENT PLACEMENT: SHX6546

## 2021-07-27 SURGERY — CYSTOSCOPY/URETEROSCOPY/HOLMIUM LASER/STENT PLACEMENT
Anesthesia: General | Laterality: Right

## 2021-07-27 MED ORDER — LIDOCAINE 2% (20 MG/ML) 5 ML SYRINGE
INTRAMUSCULAR | Status: DC | PRN
  Administered 2021-07-27: 60 mg via INTRAVENOUS

## 2021-07-27 MED ORDER — PROMETHAZINE HCL 25 MG/ML IJ SOLN: 6.2500 mg | INTRAMUSCULAR | Status: DC | PRN

## 2021-07-27 MED ORDER — FENTANYL CITRATE (PF) 100 MCG/2ML IJ SOLN
INTRAMUSCULAR | Status: DC | PRN
  Administered 2021-07-27: 50 ug via INTRAVENOUS
  Administered 2021-07-27 (×2): 25 ug via INTRAVENOUS

## 2021-07-27 MED ORDER — PROPOFOL 10 MG/ML IV BOLUS
INTRAVENOUS | Status: AC
  Filled 2021-07-27: qty 20

## 2021-07-27 MED ORDER — MIDAZOLAM HCL 2 MG/2ML IJ SOLN
INTRAMUSCULAR | Status: DC | PRN
  Administered 2021-07-27: 2 mg via INTRAVENOUS

## 2021-07-27 MED ORDER — SULFAMETHOXAZOLE-TRIMETHOPRIM 800-160 MG PO TABS: 1.0000 | ORAL_TABLET | Freq: Two times a day (BID) | ORAL | 0 refills | Status: AC

## 2021-07-27 MED ORDER — DEXAMETHASONE SODIUM PHOSPHATE 10 MG/ML IJ SOLN
INTRAMUSCULAR | Status: AC
  Filled 2021-07-27: qty 1

## 2021-07-27 MED ORDER — PROPOFOL 500 MG/50ML IV EMUL
INTRAVENOUS | Status: DC | PRN
  Administered 2021-07-27: 150 mg via INTRAVENOUS

## 2021-07-27 MED ORDER — ONDANSETRON HCL 4 MG/2ML IJ SOLN
INTRAMUSCULAR | Status: DC | PRN
  Administered 2021-07-27: 4 mg via INTRAVENOUS

## 2021-07-27 MED ORDER — AMISULPRIDE (ANTIEMETIC) 5 MG/2ML IV SOLN: 10.0000 mg | Freq: Once | INTRAVENOUS | Status: DC | PRN

## 2021-07-27 MED ORDER — IOHEXOL 300 MG/ML  SOLN
INTRAMUSCULAR | Status: DC | PRN
  Administered 2021-07-27: 8 mL

## 2021-07-27 MED ORDER — SODIUM CHLORIDE 0.9 % IR SOLN
Status: DC | PRN
  Administered 2021-07-27: 3000 mL

## 2021-07-27 MED ORDER — MIDAZOLAM HCL 2 MG/2ML IJ SOLN
INTRAMUSCULAR | Status: AC
  Filled 2021-07-27: qty 2

## 2021-07-27 MED ORDER — DEXAMETHASONE SODIUM PHOSPHATE 10 MG/ML IJ SOLN
INTRAMUSCULAR | Status: DC | PRN
  Administered 2021-07-27: 10 mg via INTRAVENOUS

## 2021-07-27 MED ORDER — ORAL CARE MOUTH RINSE
15.0000 mL | Freq: Once | OROMUCOSAL | Status: AC
  Administered 2021-07-27: 15 mL via OROMUCOSAL

## 2021-07-27 MED ORDER — OXYCODONE HCL 5 MG PO TABS: 5.0000 mg | ORAL_TABLET | Freq: Once | ORAL | Status: AC | PRN

## 2021-07-27 MED ORDER — CHLORHEXIDINE GLUCONATE 0.12 % MT SOLN: 15.0000 mL | Freq: Once | OROMUCOSAL | Status: AC

## 2021-07-27 MED ORDER — CELECOXIB 200 MG PO CAPS: 200.0000 mg | ORAL_CAPSULE | Freq: Once | ORAL | Status: DC

## 2021-07-27 MED ORDER — DOCUSATE SODIUM 100 MG PO CAPS: 100.0000 mg | ORAL_CAPSULE | Freq: Every day | ORAL | 0 refills | Status: DC | PRN

## 2021-07-27 MED ORDER — OXYCODONE-ACETAMINOPHEN 5-325 MG PO TABS: 1.0000 | ORAL_TABLET | ORAL | 0 refills | Status: DC | PRN

## 2021-07-27 MED ORDER — GENTAMICIN SULFATE 40 MG/ML IJ SOLN
5.0000 mg/kg | Freq: Once | INTRAVENOUS | Status: AC
  Administered 2021-07-27: 330 mg via INTRAVENOUS
  Filled 2021-07-27: qty 8.25

## 2021-07-27 MED ORDER — OXYCODONE HCL 5 MG PO TABS
ORAL_TABLET | ORAL | Status: AC
  Administered 2021-07-27: 5 mg via ORAL
  Filled 2021-07-27: qty 1

## 2021-07-27 MED ORDER — LACTATED RINGERS IV SOLN: INTRAVENOUS | Status: DC

## 2021-07-27 MED ORDER — OXYCODONE HCL 5 MG/5ML PO SOLN: 5.0000 mg | Freq: Once | ORAL | Status: AC | PRN

## 2021-07-27 MED ORDER — FENTANYL CITRATE PF 50 MCG/ML IJ SOSY: 25.0000 ug | PREFILLED_SYRINGE | INTRAMUSCULAR | Status: DC | PRN

## 2021-07-27 MED ORDER — ACETAMINOPHEN 500 MG PO TABS
ORAL_TABLET | ORAL | Status: AC
  Filled 2021-07-27: qty 2

## 2021-07-27 MED ORDER — PHENYLEPHRINE 40 MCG/ML (10ML) SYRINGE FOR IV PUSH (FOR BLOOD PRESSURE SUPPORT)
PREFILLED_SYRINGE | INTRAVENOUS | Status: DC | PRN
  Administered 2021-07-27 (×6): 80 ug via INTRAVENOUS

## 2021-07-27 MED ORDER — ONDANSETRON HCL 4 MG/2ML IJ SOLN
INTRAMUSCULAR | Status: AC
  Filled 2021-07-27: qty 2

## 2021-07-27 MED ORDER — ACETAMINOPHEN 500 MG PO TABS
1000.0000 mg | ORAL_TABLET | Freq: Once | ORAL | Status: AC
  Administered 2021-07-27: 1000 mg via ORAL

## 2021-07-27 MED ORDER — FENTANYL CITRATE (PF) 100 MCG/2ML IJ SOLN
INTRAMUSCULAR | Status: AC
  Filled 2021-07-27: qty 2

## 2021-07-27 SURGICAL SUPPLY — 22 items
BAG URO CATCHER STRL LF (MISCELLANEOUS) ×3 IMPLANT
BASKET ZERO TIP NITINOL 2.4FR (BASKET) ×2 IMPLANT
BSKT STON RTRVL ZERO TP 2.4FR (BASKET) ×1
CATH URETL OPEN 5X70 (CATHETERS) ×3 IMPLANT
CLOTH BEACON ORANGE TIMEOUT ST (SAFETY) ×1 IMPLANT
FIBER LASER MOSES 200 DFL (Laser) IMPLANT
GLOVE SURG ENC TEXT LTX SZ7 (GLOVE) ×3 IMPLANT
GOWN STRL REUS W/TWL LRG LVL3 (GOWN DISPOSABLE) ×3 IMPLANT
GUIDEWIRE STR DUAL SENSOR (WIRE) ×6 IMPLANT
GUIDEWIRE ZIPWRE .038 STRAIGHT (WIRE) IMPLANT
KIT TURNOVER KIT A (KITS) IMPLANT
LASER FIB FLEXIVA PULSE ID 365 (Laser) IMPLANT
MANIFOLD NEPTUNE II (INSTRUMENTS) ×3 IMPLANT
PACK CYSTO (CUSTOM PROCEDURE TRAY) ×3 IMPLANT
SHEATH URETERAL 12FR 45CM (SHEATH) IMPLANT
SHEATH URETERAL 12FRX35CM (MISCELLANEOUS) ×2 IMPLANT
STENT URET 6FRX24 CONTOUR (STENTS) ×2 IMPLANT
TRACTIP FLEXIVA PULS ID 200XHI (Laser) IMPLANT
TRACTIP FLEXIVA PULSE ID 200 (Laser) ×3
TUBING CONNECTING 10 (TUBING) ×2 IMPLANT
TUBING CONNECTING 10' (TUBING) ×1
TUBING UROLOGY SET (TUBING) ×3 IMPLANT

## 2021-07-27 NOTE — Transfer of Care (Signed)
Immediate Anesthesia Transfer of Care Note  Patient: Colleen Reynolds  Procedure(s) Performed: CYSTOSCOPY, RETROGRADE PYELOGRAM, URETEROSCOPY, HOLMIUM LASER, STENT PLACEMENT (Right)  Patient Location: PACU  Anesthesia Type:General  Level of Consciousness: awake, alert , oriented, drowsy and patient cooperative  Airway & Oxygen Therapy: Patient Spontanous Breathing and Patient connected to face mask oxygen  Post-op Assessment: Report given to RN and Post -op Vital signs reviewed and stable  Post vital signs: Reviewed and stable  Last Vitals:  Vitals Value Taken Time  BP 128/92 07/27/21 1528  Temp    Pulse 99 07/27/21 1530  Resp 32 07/27/21 1530  SpO2 100 % 07/27/21 1530  Vitals shown include unvalidated device data.  Last Pain:  Vitals:   07/27/21 1101  TempSrc: Oral         Complications: No notable events documented.

## 2021-07-27 NOTE — Op Note (Signed)
Operative Note  Preoperative diagnosis:  1.  Right lower pole renal stone  Postoperative diagnosis: 1.  Right lower pole renal stone  Procedure(s): 1.  Cystoscopy 2. Right ureteroscopy with laser lithotripsy and basket extraction of stones 3. Right retrograde pyelogram 4. Right ureteral stent exchange 5. Fluoroscopy with intraoperative interpretation  Surgeon: Rexene Alberts, MD  Assistants:  None  Anesthesia:  General  Complications:  None  EBL:  Minimal  Specimens: 1. Stones for stone analysis (to be done at [facility])  Drains/Catheters: 1.  Right 6Fr x 24cm ureteral stent with a tether string  Intraoperative findings:   Cystoscopy demonstrated no suspicious bladder lesions. Right ureteroscopy demonstrated 75mm right lower pole renal stone. Successful right ureteral stent placement.  Indication:  Colleen Reynolds is a 76 y.o. female with a 58mm right lower pole renal stone. She is here for definitive treatment of her renal stone.  Description of procedure: After informed consent was obtained from the patient, the patient was identified and taken to the operating room and placed in the supine position.  General anesthesia was administered as well as perioperative IV antibiotics.  At the beginning of the case, a time-out was performed to properly identify the patient, the surgery to be performed, and the surgical site.  Sequential compression devices were applied to the lower extremities at the beginning of the case for DVT prophylaxis.  The patient was then placed in the dorsal lithotomy supine position, prepped and draped in sterile fashion.  Preliminary scout fluoroscopy revealed that there was a 8mm calcification area at the right lower pole, which corresponds to the stone found on the preoperative CT scan. We then passed the 21-French rigid cystoscope through the urethra and into the bladder under vision without any difficulty, noting a normal urethra.  A systematic  evaluation of the bladder revealed no evidence of any suspicious bladder lesions.  Ureteral orifices were in normal position.    The distal aspect of the ureteral stent was seen protruding from the right ureteral orifice.  We then used the alligator-tooth forceps and grasped the distal end of the ureteral stent and brought it out the urethral meatus while watching the proximal coil straighten out nicely on fluoroscopy. Through the ureteral stent, we then passed a 0.038 sensor wire up to the level of the renal pelvis.  The ureteral stent was then removed, leaving the sensor wire up the right ureter.    Under cystoscopic and flouroscopic guidance, we cannulated the right ureteral orifice with a 5-French open-ended ureteral catheter and a gentle retrograde pyelogram was performed, revealing a normal caliber ureter without any filling defects. There was no hydronephrosis of the collecting system. There was a 40mm filling defect in the right lower pole corresponding to the right lower pole renal stone. A 0.038 sensor wire was then passed up to the level of the renal pelvis and secured to the drape as a safety wire. The ureteral catheter and cystoscope were removed, leaving the safety wire in place.   A semi-rigid ureteroscope was passed alongside the wire up the distal ureter which appeared normal. A second 0.038 sensor wire was passed under direct vision and the semirigid scope was removed. A 12/14 Fr ureteral access sheath was carefully advanced up the ureter to the level of the UPJ over this wire under fluoroscopic guidance. The flexible ureteroscope was advanced into the collecting system via the access sheath. The collecting system was inspected. The calculus was identified at the right lower pole. Using  the 200 micron holmium laser fiber, the stone was fragmented completely. A 2.2 Fr zero tip basket was used to remove the fragments under visual guidance. These were sent for chemical analysis. With the  ureteroscope in the kidney, a gentle pyelogram was performed to delineate the calyceal system and we evaluated the calyces systematically. We encountered no further stones. The rest of the stone fragments were very tiny and these were  irrigated away gently. The calyces were re-inspected and there were no significant stone fragment residual.   We then withdrew the ureteroscope back down the ureter along with the access sheath, noting no evidence of any stones along the course of the ureter.  Prior to removing the ureteroscope, we did pass the Glidewire back up to the ureter to the renal pelvis.  Once the ureteroscope was removed, the Glidewire was backloaded through the rigid cystoscope, which was then advanced down the urethra and into the bladder. We then used the Glidewire under direct vision through the rigid cystoscope and under fluoroscopic guidance and passed up a 6-French, 24 cm double-pigtail ureteral stent up ureter, making sure that the proximal and distal ends coiled within the kidney and bladder respectively.  Note that we left a long tether string attached to the distal end of the ureteral stent and it exited the urethral meatus and was tucked in the vagina.  The cystoscope was then advanced back into the bladder under vision.  We were able to see the distal stent coiling nicely within the bladder.  The bladder was then emptied with irrigation solution.  The cystoscope was then removed.    The patient tolerated the procedure well and there was no complication. Patient was awoken from anesthesia and taken to the recovery room in stable condition. I was present and scrubbed for the entirety of the case.  Plan:  Patient will be discharged home.  Follow up with me in 1 month with renal ultrasound.  [facility]  Pager: [phone]

## 2021-07-27 NOTE — Anesthesia Procedure Notes (Signed)
Procedure Name: LMA Insertion Date/Time: 07/27/2021 2:20 PM Performed by: Gerald Leitz, CRNA Pre-anesthesia Checklist: Patient identified, Patient being monitored, Timeout performed, Emergency Drugs available and Suction available Patient Re-evaluated:Patient Re-evaluated prior to induction Oxygen Delivery Method: Circle system utilized Preoxygenation: Pre-oxygenation with 100% oxygen Induction Type: IV induction Ventilation: Mask ventilation without difficulty LMA: LMA inserted LMA Size: 4.0 Tube type: Oral Number of attempts: 1 Placement Confirmation: positive ETCO2 and breath sounds checked- equal and bilateral Tube secured with: Tape Dental Injury: Teeth and Oropharynx as per pre-operative assessment

## 2021-07-27 NOTE — H&P (Signed)
Office Visit Report     07/21/2021    CC/HPI: Colleen Reynolds is a 76 year old female is seen in follow-up with history of right renal stone.   1. Right renal stone:  -She presented to the ED 05/13/2022 with symptoms of sepsis from a urinary source. She had history of UTI symptoms over the preceding week. She developed right flank pain, nausea. She presented the ED and was found to be in A. fib with RVR. She was hypotensive. She was afebrile. She reported chills and rigors at home. CT A/P 07/09/2021 demonstrated a 5 mm stone in the lower pole of the right kidney obstructing the lower pole calyces. Attempt was made at percutaneous nephrostomy tube placement however this is unsuccessful. She underwent right ureteral stent with obstructing right lower pole stone by Dr. Louis Meckel on 07/11/2021. Her urine cultures resulted no growth. She completed a 10-day course of cefadroxil.  -She denies fevers, chills, dysuria, abdominal pain or flank pain.  -She does have a prior history of urolithiasis.   Patient currently denies fever, chills, sweats, nausea, vomiting, abdominal or flank pain, gross hematuria or dysuria.   She does have a history of atrial fibrillation and takes Eliquis. She has had a hysterectomy.     ALLERGIES: Penicillin Statin Drugs    MEDICATIONS: Carvedilol 6.25 mg tablet  Cefadroxil 500 mg capsule  Eliquis 5 mg tablet  Famotidine 20 mg tablet  Fluoxetine Hcl 40 mg capsule  Valacyclovir 1,000 mg tablet     GU PSH: Hysterectomy - 2001     NON-GU PSH: Breast Reduction - 1985     GU PMH: None     PMH Notes:  1898-07-05 00:00:00 - Note: Normal Routine History And Physical Adult   NON-GU PMH: Arthritis Atrial Fibrillation Depression GERD    FAMILY HISTORY: 2 daughters - Runs in Family   SOCIAL HISTORY: Marital Status: Married Ethnicity: Not Hispanic Or Latino; Race: White Current Smoking Status: Patient has never smoked.   Tobacco Use Assessment Completed: Used Tobacco  in last 30 days? Does drink.  Drinks 1 caffeinated drink per day.    REVIEW OF SYSTEMS:    GU Review Female:   Patient reports get up at night to urinate. Patient denies frequent urination, hard to postpone urination, burning /pain with urination, leakage of urine, stream starts and stops, trouble starting your stream, have to strain to urinate, and being pregnant.  Gastrointestinal (Upper):   Patient denies nausea, vomiting, and indigestion/ heartburn.  Gastrointestinal (Lower):   Patient denies diarrhea and constipation.  Constitutional:   Patient denies fever, night sweats, weight loss, and fatigue.  Skin:   Patient denies skin rash/ lesion and itching.  Eyes:   Patient denies blurred vision and double vision.  Ears/ Nose/ Throat:   Patient denies sore throat and sinus problems.  Hematologic/Lymphatic:   Patient denies swollen glands and easy bruising.  Cardiovascular:   Patient denies leg swelling and chest pains.  Respiratory:   Patient denies cough and shortness of breath.  Endocrine:   Patient denies excessive thirst.  Musculoskeletal:   Patient denies back pain and joint pain.  Neurological:   Patient denies headaches and dizziness.  Psychologic:   Patient reports depression and anxiety.    VITAL SIGNS:      07/21/2021 01:28 PM  Weight 170 lb / 77.11 kg  Height 66 in / 167.64 cm  BP 99/68 mmHg  Pulse 76 /min  Temperature 97.3 F / 36.2 C  BMI 27.4 kg/m   MULTI-SYSTEM  PHYSICAL EXAMINATION:    Constitutional: Well-nourished. No physical deformities. Normally developed. Good grooming.  Respiratory: No labored breathing, no use of accessory muscles.   Cardiovascular: Normal temperature, normal extremity pulses, no swelling, no varicosities.  Gastrointestinal: No mass, no tenderness, no rigidity, non obese abdomen. No CVA tenderness     Complexity of Data:  Source Of History:  Patient, Medical Record Summary  Records Review:   Previous Doctor Records, Previous Hospital  Records  Urine Test Review:   Urinalysis  X-Ray Review: C.T. Abdomen/Pelvis: Reviewed Films. Reviewed Report. Discussed With Patient.     PROCEDURES:          Urinalysis w/Scope Dipstick Dipstick Cont'd Micro  Color: Yellow Bilirubin: Neg mg/dL WBC/hpf: 0 - 5/hpf  Appearance: Cloudy Ketones: Neg mg/dL RBC/hpf: 20 - 40/hpf  Specific Gravity: 1.025 Blood: 3+ ery/uL Bacteria: Mod (26-50/hpf)  pH: 5.5 Protein: Trace mg/dL Cystals: NS (Not Seen)  Glucose: Neg mg/dL Urobilinogen: 2.0 mg/dL Casts: NS (Not Seen)    Nitrites: Neg Trichomonas: Not Present    Leukocyte Esterase: Trace leu/uL Mucous: Not Present      Epithelial Cells: 6 - 10/hpf      Yeast: NS (Not Seen)      Sperm: Not Present    ASSESSMENT:      ICD-10 Details  1 GU:   Renal calculus - N20.0    PLAN:           Orders Labs CULTURE, URINE          Schedule X-Rays: 1 Month - Renal Ultrasound. No Oral Contrast  Return Visit/Planned Activity: 1 Month - Office Visit          Document Letter(s):  Created for Patient: Clinical Summary         Notes:   1. Right renal stone:  - We discussed options and she is elected to proceed with cystoscopy, right retrograde pyelogram, right ureteroscopy with laser lithotripsy and basket traction of stone. Discussed risk and benefits. Surgery scheduled for next week. We will send urine for culture. If positive, will treat with antibiotics.  -We discussed stone prevention as below.  -She will follow-up after surgery with renal ultrasound the following month to ensure no hydronephrosis. At that time, we will discuss metabolic evaluation.   We discussed the options for management of kidney stones, including observation, ESWL, ureteroscopy with laser lithotripsy, and PCNL. The risks and benefits of each option were discussed.  For observation I described the risks which include but are not limited to silent renal damage, life-threatening infection, need for emergent surgery, failure to pass  stone, and pain.   ESWL: risks and benefits of ESWL were outlined including infection, bleeding, pain, steinstrasse, kidney injury, need for ancillary treatments, and global anesthesia risks including but not limited to CVA, MI, DVT, PE, pneumonia, and death.   Ureteroscopy: risks and benefits of ureteroscopy were outlined, including infection, bleeding, pain, temporary ureteral stent and associated stent bother, ureteral injury, ureteral stricture, need for ancillary treatments, and global anesthesia risks including but not limited to CVA, MI, DVT, PE, pneumonia, and death.   PCNL: risks and benefits of PCNL were outlined including infection, bleeding, blood transfusion, pain, pneumothorax, bowel injury, persistent urine leak, positioning injury, inability to clear stone burden, renal laceration, arterial venous fistula or malformation, need for ancillary treatments, and global anesthesia risks including but not limited to CVA, MI, DVT, PE, pneumonia, and death.    We discussed dietary methods for stone prevention including the following: increased  water intake to 2-3 liters per day, add lemon or lemon concentrate to water to increase citrate which is beneficial for stone prevention, limiting dietary sodium to less than 2000 mg per day, limiting animal protein to less than 2 servings (16 ounces/day), and limiting foods high in oxalate content (spinach, beans, chocolate, etc.).          Next Appointment:      Next Appointment: 07/27/2021 12:45 PM    Appointment Type: Surgery     Location: [facility] [phone]    Provider: Rexene Alberts, M.D.    Reason for Visit: [facility]/OP CYSTO, RT RPG, RT URS, LL, RT STENT   Urology Preoperative H&P   Chief Complaint: Right renal stone  History of Present Illness: Colleen Reynolds is a 76 y.o. female with a right renal stone here for ureteroscopy with laser lithotripsy and basket extraction of stone. Denies fevers, chills,  dysuria.    Past Medical History:  Diagnosis Date   Anemia    Atrial fibrillation ([place])    Depression    Diverticulosis    GERD (gastroesophageal reflux disease)    History of kidney stones    Kidney stones 2008   OA (osteoarthritis) of hip    Osteopenia    Shingles    Vitamin D deficiency     Past Surgical History:  Procedure Laterality Date   ABDOMINAL HYSTERECTOMY     BREAST REDUCTION SURGERY  1985   COLONOSCOPY  2005   CYSTOSCOPY W/ URETERAL STENT PLACEMENT Right 07/11/2021   Procedure: CYSTOSCOPY WITH RETROGRADE PYELOGRAM/URETERAL STENT PLACEMENT;  Surgeon: Ardis Hughs, MD;  Location: [facility];  Service: Urology;  Laterality: Right;   FRACTURE SURGERY Left 2020   dislocated left elbow    IR NEPHROSTOMY PLACEMENT RIGHT  07/10/2021   KNEE SURGERY     1967   LITHOTRIPSY  2008    Allergies:  Allergies  Allergen Reactions   Clindamycin/Lincomycin Nausea And Vomiting   Lovastatin Other (See Comments)    Fatigue, muscle aches   Penicillins Rash    Tolerated Rocephin 07/10/21    Family History  Problem Relation Age of Onset   Colon cancer Neg Hx    Esophageal cancer Neg Hx    Rectal cancer Neg Hx    Stomach cancer Neg Hx     Social History:  reports that she has never smoked. She has never used smokeless tobacco. She reports current alcohol use. She reports that she does not use drugs.  ROS: A complete review of systems was performed.  All systems are negative except for pertinent findings as noted.  Physical Exam:  Vital signs in last 24 hours: Temp:  [98.1 F (36.7 C)] 98.1 F (36.7 C) (01/23 1101) Pulse Rate:  [76] 76 (01/23 1101) Resp:  [18] 18 (01/23 1101) BP: (117)/(79) 117/79 (01/23 1101) SpO2:  [99 %] 99 % (01/23 1101) Weight:  [78.3 kg] 78.3 kg (01/23 1101) Constitutional:  Alert and oriented, No acute distress Cardiovascular: Regular rate and rhythm Respiratory: Normal respiratory effort, Lungs clear bilaterally GI: Abdomen is soft,  nontender, nondistended, no abdominal masses GU: No CVA tenderness Lymphatic: No lymphadenopathy Neurologic: Grossly intact, no focal deficits Psychiatric: Normal mood and affect  Laboratory Data:  No results for input(s): WBC, HGB, HCT, PLT in the last 72 hours.  No results for input(s): NA, K, CL, GLUCOSE, BUN, CALCIUM, CREATININE in the last 72 hours.  Invalid input(s): CO3   No results found for this or  any previous visit (from the past 24 hour(s)). No results found for this or any previous visit (from the past 240 hour(s)).  Renal Function: Recent Labs    07/23/21 1005  CREATININE 0.98   Estimated Creatinine Clearance: 54 mL/min (by C-G formula based on SCr of 0.98 mg/dL).  Radiologic Imaging: No results found.  I independently reviewed the above imaging studies.  Assessment and Plan Colleen Reynolds is a 76 y.o. female with a right renal stone here for R URS/LL.  -The risks, benefits and alternatives of cystoscopy with R URS/LL, R JJ stent exchange was discussed with the patient.  Risks include, but are not limited to: bleeding, urinary tract infection, ureteral injury, ureteral stricture disease, chronic pain, urinary symptoms, bladder injury, stent migration, the need for nephrostomy tube placement, MI, CVA, DVT, PE and the inherent risks with general anesthesia.  The patient voices understanding and wishes to proceed.       Matt R. Ashauna Bertholf MD 07/27/2021, 12:50 PM  [facility] Pager: [phone]): [phone]

## 2021-07-27 NOTE — Anesthesia Postprocedure Evaluation (Signed)
Anesthesia Post Note  Patient: Demitra A Galdamez  Procedure(s) Performed: CYSTOSCOPY, RETROGRADE PYELOGRAM, URETEROSCOPY, HOLMIUM LASER, STENT PLACEMENT (Right)     Patient location during evaluation: PACU Anesthesia Type: General Level of consciousness: awake and alert and oriented Pain management: pain level controlled Vital Signs Assessment: post-procedure vital signs reviewed and stable Respiratory status: spontaneous breathing, nonlabored ventilation and respiratory function stable Cardiovascular status: blood pressure returned to baseline Postop Assessment: no apparent nausea or vomiting Anesthetic complications: no   No notable events documented.  Last Vitals:  Vitals:   07/27/21 1527 07/27/21 1545  BP: (!) 139/105 (!) 142/69  Pulse: 99 76  Resp: 18 12  Temp: 36.9 C   SpO2: 100% 98%    Last Pain:  Vitals:   07/27/21 1527  TempSrc:   PainSc: Asleep                 Marthenia Rolling

## 2021-07-27 NOTE — Discharge Instructions (Signed)
[facility] [phone] Post Ureteroscopy With or Without Stent Instructions  Definitions:  Ureter: The duct that transports urine from the kidney to the bladder. Stent:   A plastic hollow tube that is placed into the ureter, from the kidney to the bladder to prevent the ureter from swelling shut.  GENERAL INSTRUCTIONS:  Despite the fact that no skin incisions were used, the area around the ureter and bladder is raw and irritated. The stent is a foreign body which will further irritate the bladder wall. This irritation is manifested by increased frequency of urination, both day and night, and by an increase in the urge to urinate. In some, the urge to urinate is present almost always. Sometimes the urge is strong enough that you may not be able to stop yourself from urinating. The only real cure is to remove the stent and then give time for the bladder wall to heal which can't be done until the danger of the ureter swelling shut has passed, which varies.  You may see some blood in your urine while the stent is in place and a few days afterwards. Do not be alarmed, even if the urine was clear for a while. Get off your feet and drink lots of fluids until clearing occurs. If you start to pass clots or don't improve, call us.  DIET: You may return to your normal diet immediately. Because of the raw surface of your bladder, alcohol, spicy foods, acid type foods and drinks with caffeine may cause irritation or frequency and should be used in moderation. To keep your urine flowing freely and to avoid constipation, drink plenty of fluids during the day ( 8-10 glasses ). Tip: Avoid cranberry juice because it is very acidic.  ACTIVITY: Your physical activity doesn't need to be restricted. However, if you are very active, you may see some blood in your urine. We suggest that you reduce your activity under these circumstances until the bleeding has stopped.  BOWELS: It is important to  keep your bowels regular during the postoperative period. Straining with bowel movements can cause bleeding. A bowel movement every other day is reasonable. Use a mild laxative if needed, such as Milk of Magnesia 2-3 tablespoons, or 2 Dulcolax tablets. Call if you continue to have problems. If you have been taking narcotics for pain, before, during or after your surgery, you may be constipated. Take a laxative if necessary.   MEDICATION: You should resume your pre-surgery medications unless told not to. In addition you will often be given an antibiotic to prevent infection. These should be taken as prescribed until the bottles are finished unless you are having an unusual reaction to one of the drugs.  PROBLEMS YOU SHOULD REPORT TO [place]: Fevers over 100.5 Fahrenheit. Heavy bleeding, or clots ( See above notes about blood in urine ). Inability to urinate. Drug reactions ( hives, rash, nausea, vomiting, diarrhea ). Severe burning or pain with urination that is not improving.  FOLLOW-UP: You will need a follow-up appointment to monitor your progress. Call for this appointment at the number listed above. Usually the first appointment will be about three to fourteen days after your surgery.  You have a stent draining your kidney. Remove stent by pulling on attached string in 3 days.

## 2021-07-27 NOTE — Anesthesia Preprocedure Evaluation (Addendum)
Anesthesia Evaluation  Patient identified by MRN, date of birth, ID band Patient awake    Reviewed: Allergy & Precautions, H&P , NPO status , Patient's Chart, lab work & pertinent test results  History of Anesthesia Complications Negative for: history of anesthetic complications  Airway Mallampati: II  TM Distance: >3 FB Neck ROM: full    Dental  (+) Dental Advisory Given   Pulmonary neg pulmonary ROS,    breath sounds clear to auscultation       Cardiovascular + dysrhythmias Atrial Fibrillation  Rhythm:regular Rate:Normal  IMPRESSIONS    1. Left ventricular ejection fraction, by estimation, is 55 to 60%. The  left ventricle has normal function. The left ventricle has no regional  wall motion abnormalities. There is mild left ventricular hypertrophy.  Left ventricular diastolic parameters  were normal.  2. Right ventricular systolic function is normal. The right ventricular  size is normal.  3. The mitral valve is normal in structure. Mild mitral valve  regurgitation.  4. The aortic valve is tricuspid. Aortic valve regurgitation is not  visualized. Aortic valve sclerosis is present, with no evidence of aortic  valve stenosis.  5. The inferior vena cava is dilated in size with <50% respiratory  variability, suggesting right atrial pressure of 15 mmHg.    Neuro/Psych PSYCHIATRIC DISORDERS Depression    GI/Hepatic   Endo/Other    Renal/GU Renal diseaseRight ureteral stone     Musculoskeletal  (+) Arthritis ,   Abdominal   Peds  Hematology   Anesthesia Other Findings   Reproductive/Obstetrics                            Anesthesia Physical  Anesthesia Plan  ASA: 3  Anesthesia Plan: General   Post-op Pain Management: Celebrex PO (pre-op) and Tylenol PO (pre-op)   Induction: Intravenous  PONV Risk Score and Plan: 3 and Ondansetron, Dexamethasone and Treatment may vary due to  age or medical condition  Airway Management Planned: LMA  Additional Equipment:   Intra-op Plan:   Post-operative Plan: Extubation in OR  Informed Consent: I have reviewed the patients History and Physical, chart, labs and discussed the procedure including the risks, benefits and alternatives for the proposed anesthesia with the patient or authorized representative who has indicated his/her understanding and acceptance.     Dental advisory given  Plan Discussed with: Anesthesiologist and CRNA  Anesthesia Plan Comments:        Anesthesia Quick Evaluation

## 2021-07-28 ENCOUNTER — Encounter (HOSPITAL_COMMUNITY): Payer: Self-pay | Admitting: Urology

## 2021-08-19 ENCOUNTER — Ambulatory Visit: Payer: Medicare HMO

## 2021-08-25 DIAGNOSIS — N200 Calculus of kidney: Secondary | ICD-10-CM | POA: Diagnosis not present

## 2021-09-10 ENCOUNTER — Other Ambulatory Visit (HOSPITAL_COMMUNITY): Payer: Medicare HMO

## 2021-09-21 ENCOUNTER — Other Ambulatory Visit: Payer: Self-pay | Admitting: Family Medicine

## 2021-09-21 DIAGNOSIS — F339 Major depressive disorder, recurrent, unspecified: Secondary | ICD-10-CM

## 2021-11-16 DIAGNOSIS — I480 Paroxysmal atrial fibrillation: Secondary | ICD-10-CM | POA: Diagnosis not present

## 2021-11-16 DIAGNOSIS — I10 Essential (primary) hypertension: Secondary | ICD-10-CM | POA: Diagnosis not present

## 2021-11-16 DIAGNOSIS — E7800 Pure hypercholesterolemia, unspecified: Secondary | ICD-10-CM | POA: Diagnosis not present

## 2021-11-16 DIAGNOSIS — D229 Melanocytic nevi, unspecified: Secondary | ICD-10-CM | POA: Diagnosis not present

## 2021-11-16 DIAGNOSIS — B0230 Zoster ocular disease, unspecified: Secondary | ICD-10-CM | POA: Diagnosis not present

## 2021-11-16 DIAGNOSIS — Z79899 Other long term (current) drug therapy: Secondary | ICD-10-CM | POA: Diagnosis not present

## 2021-11-19 ENCOUNTER — Telehealth: Payer: Self-pay

## 2021-11-19 NOTE — Telephone Encounter (Signed)
NOTES SCANNED TO REFERRAL 

## 2021-11-20 ENCOUNTER — Ambulatory Visit: Payer: Medicare HMO | Admitting: Cardiovascular Disease

## 2021-11-20 ENCOUNTER — Encounter: Payer: Self-pay | Admitting: Cardiovascular Disease

## 2021-11-20 VITALS — BP 102/70 | HR 90 | Ht 64.0 in | Wt 182.8 lb

## 2021-11-20 DIAGNOSIS — E7800 Pure hypercholesterolemia, unspecified: Secondary | ICD-10-CM

## 2021-11-20 DIAGNOSIS — I480 Paroxysmal atrial fibrillation: Secondary | ICD-10-CM | POA: Diagnosis not present

## 2021-11-20 DIAGNOSIS — I10 Essential (primary) hypertension: Secondary | ICD-10-CM | POA: Diagnosis not present

## 2021-11-20 DIAGNOSIS — D6869 Other thrombophilia: Secondary | ICD-10-CM

## 2021-11-20 DIAGNOSIS — I4891 Unspecified atrial fibrillation: Secondary | ICD-10-CM

## 2021-11-20 NOTE — Patient Instructions (Signed)
Medication Instructions:  No changes *If you need a refill on your cardiac medications before your next appointment, please call your pharmacy*   Lab Work: None ordered If you have labs (blood work) drawn today and your tests are completely normal, you will receive your results only by: MyChart Message (if you have MyChart) OR A paper copy in the mail If you have any lab test that is abnormal or we need to change your treatment, we will call you to review the results.   Testing/Procedures: None ordered   Follow-Up: At [facility], you and your health needs are our priority.  As part of our continuing mission to provide you with exceptional heart care, we have created designated Provider Care Teams.  These Care Teams include your primary Cardiologist (physician) and Advanced Practice Providers (APPs -  Physician Assistants and Nurse Practitioners) who all work together to provide you with the care you need, when you need it.  We recommend signing up for the patient portal called "MyChart".  Sign up information is provided on this After Visit Summary.  MyChart is used to connect with patients for Virtual Visits (Telemedicine).  Patients are able to view lab/test results, encounter notes, upcoming appointments, etc.  Non-urgent messages can be sent to your provider as well.   To learn more about what you can do with MyChart, go to [email or web address].    Your next appointment:   12 month(s)  The format for your next appointment:   In Person  Provider:   Dr. Croitoru  Important Information About Sugar       

## 2021-11-20 NOTE — Progress Notes (Unsigned)
Cardiology Office Note:    Date:  11/21/2021   ID:  Colleen Reynolds, DOB 1945-12-07, MRN [id number]  PCP:  Lujean Amel, MD   [facility] Providers Cardiologist:  Sanda Klein, MD     Referring MD: Lujean Amel, MD   Chief Complaint  Patient presents with   Consult  Colleen Reynolds is a 76 y.o. female who is being seen today for the evaluation of atrial fibrillation at the request of Koirala, Dibas, MD.   History of Present Illness:    Colleen Reynolds is a 76 y.o. female with a hx of hypertension and hypercholesterolemia as well as history of recurrent paroxysmal atrial fibrillation since age 67.  She began wearing a smart watch about a year ago and recently noticed an increase in the frequency of the episodes of atrial fibrillation.  Interestingly these appeared to portend what ended up being a diagnosis of pyelonephritis.  The episodes decreased in frequency once the pyelonephritis was treated.  She is in atrial fibrillation today but is unaware of the arrhythmia.  She is almost never aware of palpitations.  Interestingly, her smart watch has not acknowledged the current episode of arrhythmia either.  When we recorded a sample of her rhythm with a watch the diagnosis was "indeterminate".  Interestingly, she had a brief period of normal sinus rhythm, lasting for about 10 beats, sandwiched between atrial fibrillation.  She was completely unaware of the rhythm transition.  She has no other cardiovascular complaint. The patient specifically denies any chest pain at rest exertion, dyspnea at rest or with exertion, orthopnea, paroxysmal nocturnal dyspnea, syncope, palpitations, focal neurological deficits, intermittent claudication, lower extremity edema, unexplained weight gain, cough, hemoptysis or wheezing.  She has never had a stroke or TIA.  She was hospitalized with sepsis due to pyelonephritis and gram-negative bacteremia in the setting of an obstructive right renal  calculus in January.  She had an echocardiogram during that admission that showed normal findings.  Both atria were described as normal in size although the left atrial end-systolic diameter was 4.0 cm and the end-systolic volume index was borderline at 25.6 mL/m.  There were no significant valvular problems.  She has been on anticoagulation since October 2022, without serious bleeding events.  She does not have any recent falls or injuries.  When in atrial fibrillation her rate control is well controlled on carvedilol, including today.  Past Medical History:  Diagnosis Date   Anemia    Atrial fibrillation ([place])    Depression    Diverticulosis    GERD (gastroesophageal reflux disease)    History of kidney stones    Kidney stones 2008   OA (osteoarthritis) of hip    Osteopenia    Shingles    Vitamin D deficiency     Past Surgical History:  Procedure Laterality Date   ABDOMINAL HYSTERECTOMY     BREAST REDUCTION SURGERY  1985   COLONOSCOPY  2005   CYSTOSCOPY W/ URETERAL STENT PLACEMENT Right 07/11/2021   Procedure: CYSTOSCOPY WITH RETROGRADE PYELOGRAM/URETERAL STENT PLACEMENT;  Surgeon: Ardis Hughs, MD;  Location: [facility];  Service: Urology;  Laterality: Right;   CYSTOSCOPY/URETEROSCOPY/HOLMIUM LASER/STENT PLACEMENT Right 07/27/2021   Procedure: CYSTOSCOPY, RETROGRADE PYELOGRAM, URETEROSCOPY, HOLMIUM LASER, STENT PLACEMENT;  Surgeon: Janith Lima, MD;  Location: [facility];  Service: Urology;  Laterality: Right;  ONLY NEEDS 60 MIN   FRACTURE SURGERY Left 2020   dislocated left elbow    IR NEPHROSTOMY PLACEMENT RIGHT  07/10/2021   KNEE  SURGERY     1967   LITHOTRIPSY  2008    Current Medications: Current Meds  Medication Sig   apixaban (ELIQUIS) 5 MG TABS tablet Take 1 tablet (5 mg total) by mouth 2 (two) times daily.   carvedilol (COREG) 6.25 MG tablet Take 1 tablet (6.25 mg total) by mouth 2 (two) times daily with a meal.   famotidine (PEPCID) 20 MG tablet Take 20 mg by  mouth daily.    FLUoxetine (PROZAC) 40 MG capsule TAKE 1 CAPSULE BY MOUTH EVERY DAY   valACYclovir (VALTREX) 1000 MG tablet Take 1 tablet (1,000 mg total) by mouth daily.   Current Facility-Administered Medications for the 11/20/21 encounter (Office Visit) with Frederich Montilla, MD  Medication   0.9 %  sodium chloride infusion     Allergies:   Clindamycin/lincomycin, Lovastatin, and Penicillins   Social History   Socioeconomic History   Marital status: Married    Spouse name: Not on file   Number of children: Not on file   Years of education: Not on file   Highest education level: Not on file  Occupational History   Not on file  Tobacco Use   Smoking status: Never   Smokeless tobacco: Never  Vaping Use   Vaping Use: Never used  Substance and Sexual Activity   Alcohol use: Yes    Comment: socially   Drug use: No   Sexual activity: Not on file  Other Topics Concern   Not on file  Social History Narrative   Not on file   Social Determinants of Health   Financial Resource Strain: Not on file  Food Insecurity: Not on file  Transportation Needs: Not on file  Physical Activity: Not on file  Stress: Not on file  Social Connections: Not on file     Family History: The patient's family history is negative for Colon cancer, Esophageal cancer, Rectal cancer, and Stomach cancer.  ROS:   Please see the history of present illness.     All other systems reviewed and are negative.  EKGs/Labs/Other Studies Reviewed:    The following studies were reviewed today: ECHO 07/11/2021     1. Left ventricular ejection fraction, by estimation, is 55 to 60%. The left ventricle has normal function. The left ventricle has no regional wall motion abnormalities. There is mild left ventricular hypertrophy.  Left ventricular diastolic parameters  were normal.  2. Right ventricular systolic function is normal. The right ventricular size is normal.   3. The mitral valve is normal in  structure. Mild mitral valve  regurgitation.   4. The aortic valve is tricuspid. Aortic valve regurgitation is not visualized. Aortic valve sclerosis is present, with no evidence of aortic valve stenosis.   5. The inferior vena cava is dilated in size with <50% respiratory variability, suggesting right atrial pressure of 15 mmHg.   EKG:  EKG is  ordered today.  The ekg ordered today demonstrates atrial fibrillation and left anterior fascicular block, otherwise normal tracing, QTc 450 ms  Recent Labs: 07/10/2021: ALT 22; TSH 1.090 07/13/2021: Hemoglobin 11.4; Platelets 184 07/23/2021: BUN 18; Creatinine, Ser 0.98; Potassium 4.1; Sodium 137  Recent Lipid Panel    Component Value Date/Time   CHOL 242 (H) 03/23/2018 1218   TRIG 96.0 03/23/2018 1218   HDL 63.80 03/23/2018 1218   CHOLHDL 4 03/23/2018 1218   VLDL 19.2 03/23/2018 1218   LDLCALC 159 (H) 03/23/2018 1218     Risk Assessment/Calculations:    CHA2DS2-VASc Score = 2  This indicates a 2.2% annual risk of stroke. The patient's score is based upon: CHF History: 0 HTN History: 0 Diabetes History: 0 Stroke History: 0 Vascular Disease History: 0 Age Score: 1 Gender Score: 1           Physical Exam:    VS:  BP 102/70 (BP Location: Left Arm, Patient Position: Sitting, Cuff Size: Large)   Pulse 90   Ht '5\' 4"'$  (1.626 m)   Wt 182 lb 12.8 oz (82.9 kg)   SpO2 96%   BMI 31.38 kg/m     Wt Readings from Last 3 Encounters:  11/20/21 182 lb 12.8 oz (82.9 kg)  07/27/21 172 lb 9.6 oz (78.3 kg)  07/23/21 172 lb 9.6 oz (78.3 kg)     GEN: Appears well, younger than stated age, well nourished, well developed in no acute distress HEENT: Normal NECK: No JVD; No carotid bruits LYMPHATICS: No lymphadenopathy CARDIAC: Irregular, normal first and second heart sounds , no murmurs, rubs, gallops RESPIRATORY:  Clear to auscultation without rales, wheezing or rhonchi  ABDOMEN: Soft, non-tender, non-distended MUSCULOSKELETAL:  No edema; No  deformity  SKIN: Warm and dry NEUROLOGIC:  Alert and oriented x 3 PSYCHIATRIC:  Normal affect   ASSESSMENT:    1. Paroxysmal atrial fibrillation (HCC)   2. Essential hypertension   3. Acquired thrombophilia ([place])   4. Hypercholesterolemia    PLAN:    In order of problems listed above:  Paroxysmal atrial fibrillation: Asymptomatic, well rate controlled and on appropriate anticoagulation.  No particular intervention appears to be necessary.  She is essentially unaware of the arrhythmia and antiarrhythmics with, with an justifiable risk for no benefit.  Similarly, it seems unnecessary recommend ablation or Watchman device.  CHA2DS2-VASc score will soon be 4  when she turns 77 next year. HTN: Blood pressure is very well controlled.  There is borderline LVH on echocardiography and a mildly dilated left atrium.  I do not think this is the underlying cause for atrial fibrillation, which appears to have started at a very young age.  There is no evidence of other, more pervasive cardiomyopathy changes. Anticoagulation: Has tolerated this well without complications. HLP: Lipids followed by PCP.  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  Do not have a recent lipid profile available for review.  LDL was 159 on labs performed in 2019, 129 on labs performed in 2017 and 2018.  Advised attention to diet, exercise and encouraged some weight loss.  She does not have known CAD or PAD.  Consider a coronary calcium score to fine-tune recommendation for other types of lipid-lowering therapy.           Medication Adjustments/Labs and Tests Ordered: Current medicines are reviewed at length with the patient today.  Concerns regarding medicines are outlined above.  Orders Placed This Encounter  Procedures   EKG 12-Lead   No orders of the defined types were placed in this encounter.   Patient Instructions  Medication Instructions:  No changes *If you need a refill on your cardiac  medications before your next appointment, please call your pharmacy*   Lab Work: None ordered If you have labs (blood work) drawn today and your tests are completely normal, you will receive your results only by: [place] (if you have MyChart) OR A paper copy in the mail If you have any lab test that is abnormal or we need to change your treatment, we will call you to review the results.   Testing/Procedures:  None ordered   Follow-Up: At [facility], you and your health needs are our priority.  As part of our continuing mission to provide you with exceptional heart care, we have created designated Provider Care Teams.  These Care Teams include your primary Cardiologist (physician) and Advanced Practice Providers (APPs -  Physician Assistants and Nurse Practitioners) who all work together to provide you with the care you need, when you need it.  We recommend signing up for the patient portal called "MyChart".  Sign up information is provided on this After Visit Summary.  MyChart is used to connect with patients for Virtual Visits (Telemedicine).  Patients are able to view lab/test results, encounter notes, upcoming appointments, etc.  Non-urgent messages can be sent to your provider as well.   To learn more about what you can do with MyChart, go to [email or web address].    Your next appointment:   12 month(s)  The format for your next appointment:   In Person  Provider:   Dr. Sallyanne Kuster  Important Information About Sugar         Signed, Sanda Klein, MD  11/21/2021 3:56 PM    [place]

## 2022-02-25 DIAGNOSIS — Z0100 Encounter for examination of eyes and vision without abnormal findings: Secondary | ICD-10-CM | POA: Diagnosis not present

## 2022-02-25 DIAGNOSIS — H5213 Myopia, bilateral: Secondary | ICD-10-CM | POA: Diagnosis not present

## 2022-02-25 DIAGNOSIS — H5203 Hypermetropia, bilateral: Secondary | ICD-10-CM | POA: Diagnosis not present

## 2022-02-28 DIAGNOSIS — H5203 Hypermetropia, bilateral: Secondary | ICD-10-CM | POA: Diagnosis not present

## 2022-03-29 ENCOUNTER — Other Ambulatory Visit: Payer: Self-pay | Admitting: Family Medicine

## 2022-03-29 DIAGNOSIS — F339 Major depressive disorder, recurrent, unspecified: Secondary | ICD-10-CM

## 2022-05-04 DIAGNOSIS — Z79899 Other long term (current) drug therapy: Secondary | ICD-10-CM | POA: Diagnosis not present

## 2022-05-04 DIAGNOSIS — N3091 Cystitis, unspecified with hematuria: Secondary | ICD-10-CM | POA: Diagnosis not present

## 2022-05-04 DIAGNOSIS — R300 Dysuria: Secondary | ICD-10-CM | POA: Diagnosis not present

## 2022-05-04 DIAGNOSIS — Z0130 Encounter for examination of blood pressure without abnormal findings: Secondary | ICD-10-CM | POA: Diagnosis not present

## 2022-05-04 DIAGNOSIS — Z6830 Body mass index (BMI) 30.0-30.9, adult: Secondary | ICD-10-CM | POA: Diagnosis not present

## 2022-05-04 DIAGNOSIS — Z87442 Personal history of urinary calculi: Secondary | ICD-10-CM | POA: Diagnosis not present

## 2022-05-11 DIAGNOSIS — E7800 Pure hypercholesterolemia, unspecified: Secondary | ICD-10-CM | POA: Diagnosis not present

## 2022-05-11 DIAGNOSIS — Z6830 Body mass index (BMI) 30.0-30.9, adult: Secondary | ICD-10-CM | POA: Diagnosis not present

## 2022-05-11 DIAGNOSIS — M129 Arthropathy, unspecified: Secondary | ICD-10-CM | POA: Diagnosis not present

## 2022-05-11 DIAGNOSIS — Z1211 Encounter for screening for malignant neoplasm of colon: Secondary | ICD-10-CM | POA: Diagnosis not present

## 2022-05-11 DIAGNOSIS — Z0130 Encounter for examination of blood pressure without abnormal findings: Secondary | ICD-10-CM | POA: Diagnosis not present

## 2022-05-11 DIAGNOSIS — I10 Essential (primary) hypertension: Secondary | ICD-10-CM | POA: Diagnosis not present

## 2022-05-11 DIAGNOSIS — R69 Illness, unspecified: Secondary | ICD-10-CM | POA: Diagnosis not present

## 2022-05-11 DIAGNOSIS — R030 Elevated blood-pressure reading, without diagnosis of hypertension: Secondary | ICD-10-CM | POA: Diagnosis not present

## 2022-05-12 DIAGNOSIS — Z131 Encounter for screening for diabetes mellitus: Secondary | ICD-10-CM | POA: Diagnosis not present

## 2022-05-12 DIAGNOSIS — M129 Arthropathy, unspecified: Secondary | ICD-10-CM | POA: Diagnosis not present

## 2022-05-12 DIAGNOSIS — E559 Vitamin D deficiency, unspecified: Secondary | ICD-10-CM | POA: Diagnosis not present

## 2022-05-12 DIAGNOSIS — R5383 Other fatigue: Secondary | ICD-10-CM | POA: Diagnosis not present

## 2022-05-12 DIAGNOSIS — D539 Nutritional anemia, unspecified: Secondary | ICD-10-CM | POA: Diagnosis not present

## 2022-05-12 DIAGNOSIS — Z79899 Other long term (current) drug therapy: Secondary | ICD-10-CM | POA: Diagnosis not present

## 2022-05-12 DIAGNOSIS — E7800 Pure hypercholesterolemia, unspecified: Secondary | ICD-10-CM | POA: Diagnosis not present

## 2022-07-12 IMAGING — CT CT RENAL STONE PROTOCOL
2 of 4 series · 16 of 46 positions shown, 18 images · non-contrast
Comparison: None.

CLINICAL DATA: Right flank pain

EXAM:
CT ABDOMEN AND PELVIS WITHOUT CONTRAST
TECHNIQUE: Multidetector CT imaging of the abdomen and pelvis was performed
following the standard protocol without IV contrast.

[Series 2: stone full · axial · 0.77mm/px · z∈[+817,+1262]mm · 13 of 97 slices shown, 15 images]
[im 4/97  soft-tissue]
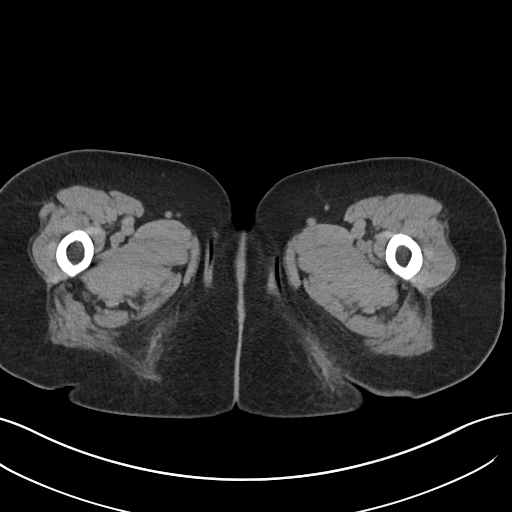
[im 4/97  bone]
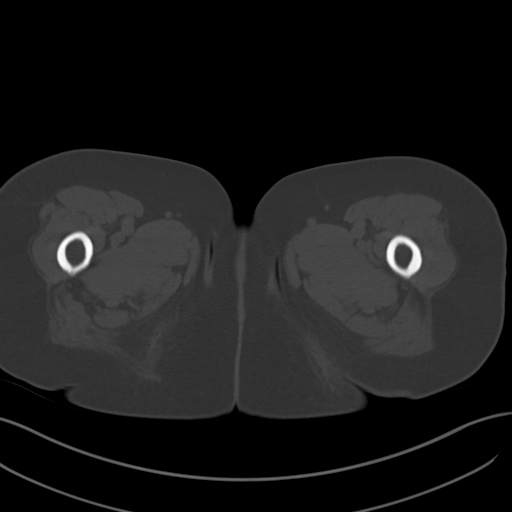
[im 12/97  soft-tissue]
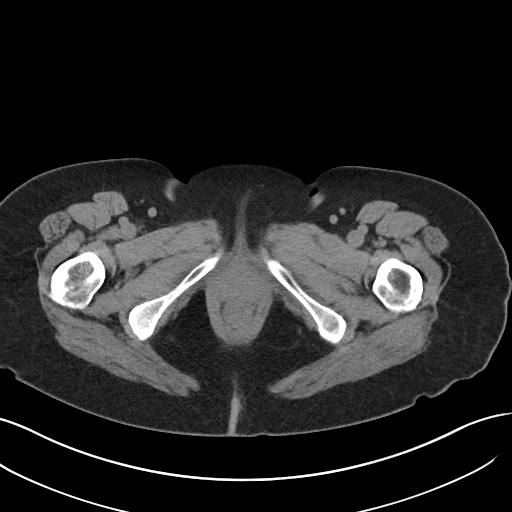
[im 20/97  soft-tissue]
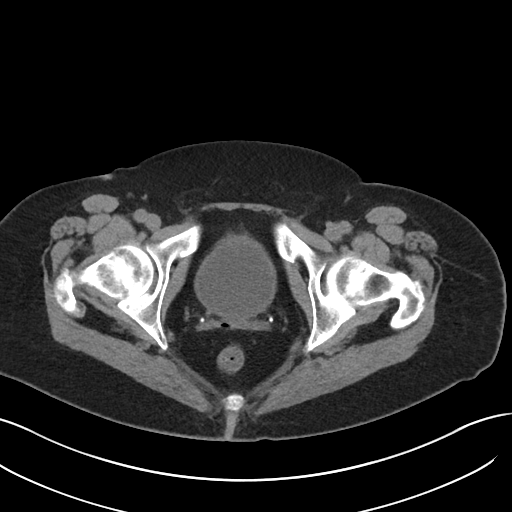
[im 27/97  soft-tissue]
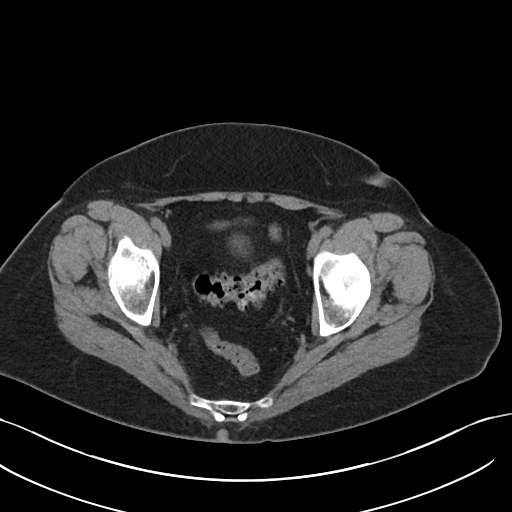
[im 35/97  soft-tissue]
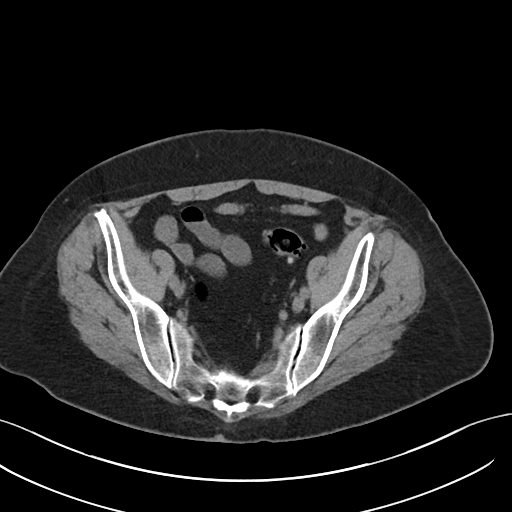
[im 43/97  soft-tissue]
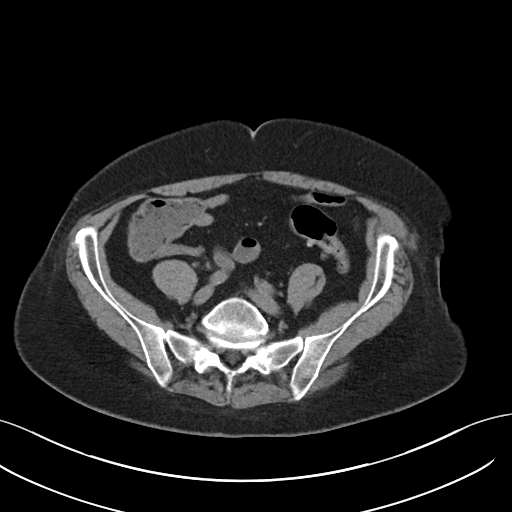
[im 50/97  soft-tissue]
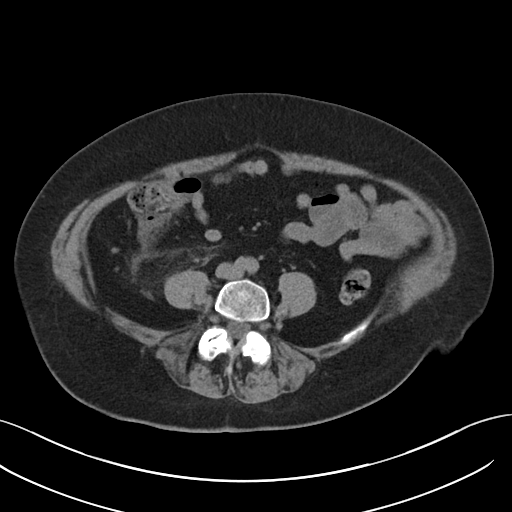
[im 54/97  soft-tissue]
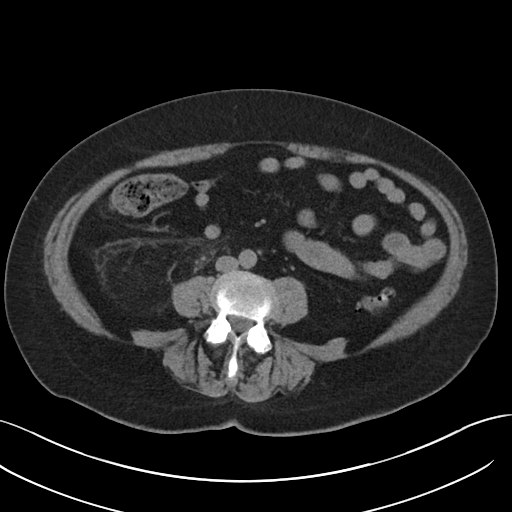
[im 62/97  soft-tissue]
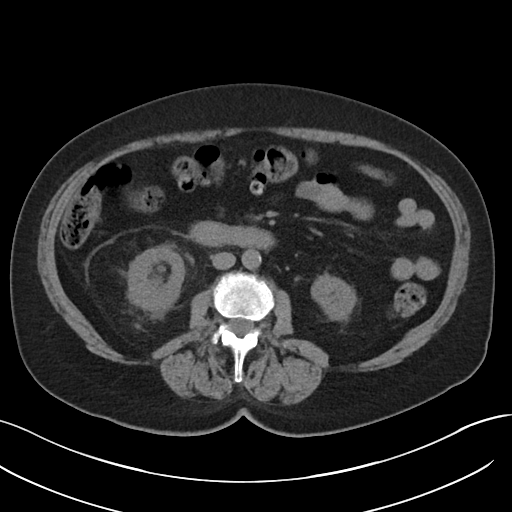
[im 62/97  bone]
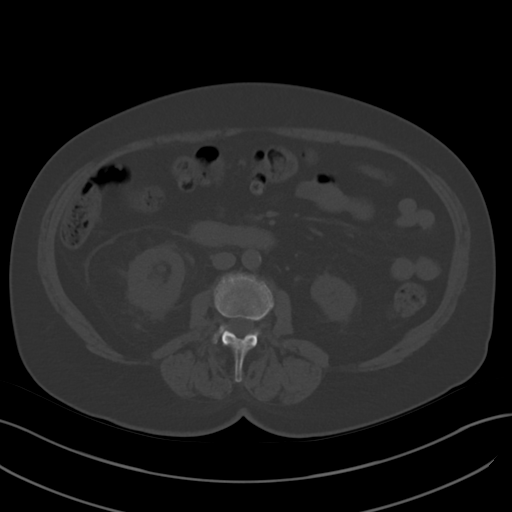
[im 70/97  soft-tissue]
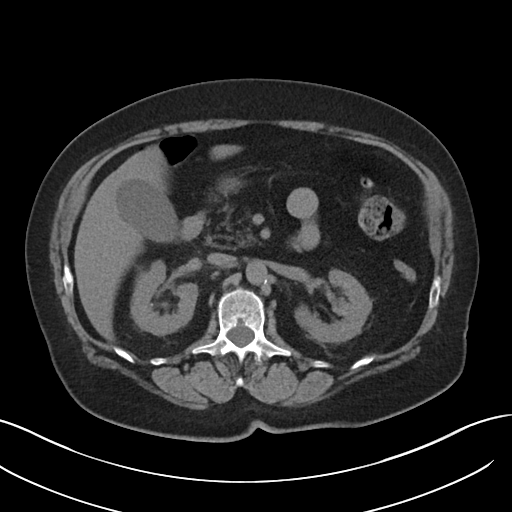
[im 77/97  soft-tissue]
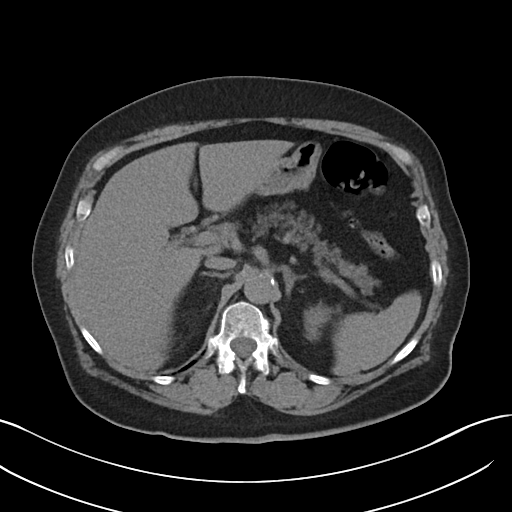
[im 85/97  soft-tissue]
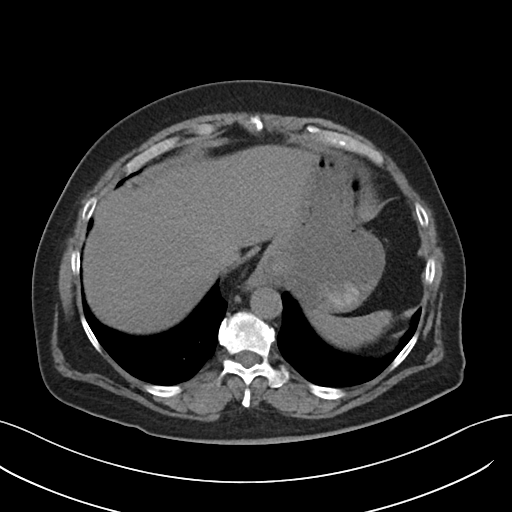
[im 93/97  soft-tissue]
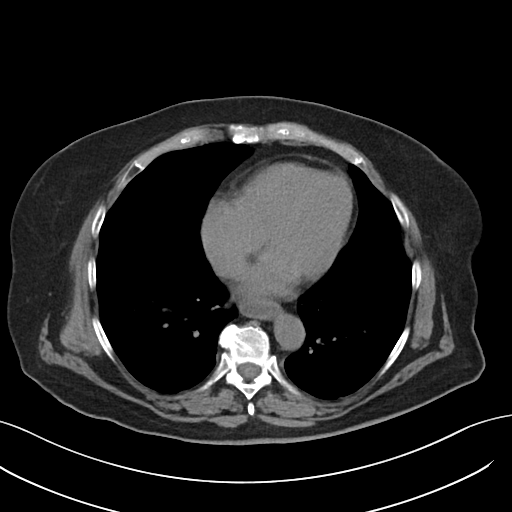

[Series 5: coronal · coronal · 0.82mm/px · 3 of 95 slices shown]
[im 32/95  soft-tissue]
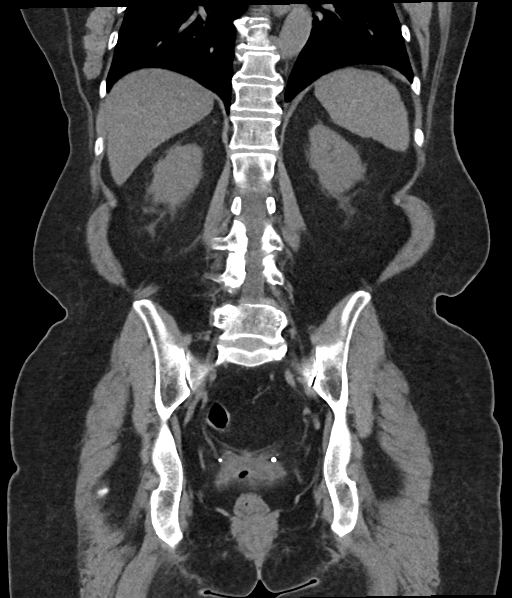
[im 42/95  soft-tissue]
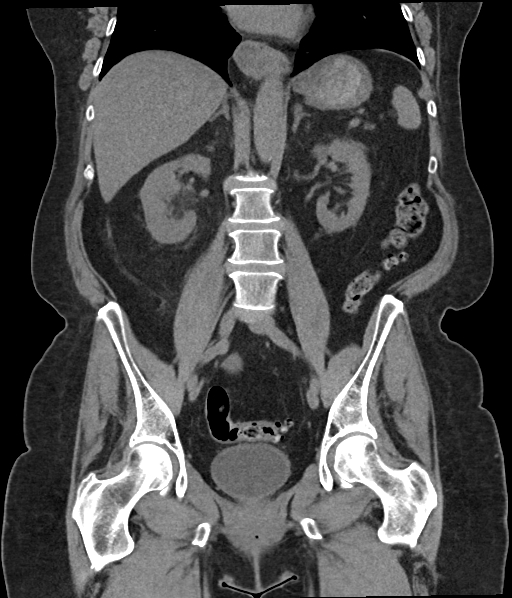
[im 53/95  soft-tissue]
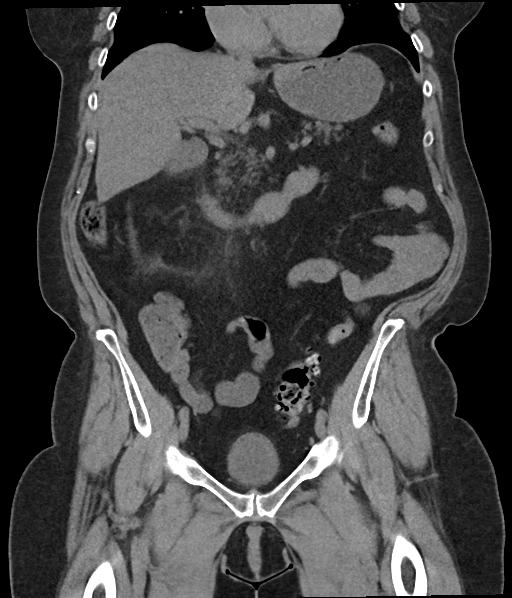

[16 of 46 positions shown; findings below may reference images not displayed]

FINDINGS: Lower chest: No acute abnormality.

Hepatobiliary: No focal liver abnormality is seen. No gallstones,
gallbladder wall thickening, or biliary dilatation.

Pancreas: Atrophic with no suspicious mass or ductal dilatation
visualized.

Spleen: Normal in size without focal abnormality.

Adrenals/Urinary Tract: Adrenal glands appear normal. 2 mm
nonobstructing calculus in the upper pole the left kidney. 5 mm
calculus in the lower pole right kidney which is near the renal
pelvis with asymmetric distention of the inferior pole calices. No
ureteral calculi or frank hydronephrosis otherwise. There is
perinephric fat stranding around the inferior pole the right kidney
extending inferiorly in the retroperitoneum. Urinary bladder appears
within normal limits.

Stomach/Bowel: Intraluminal fluid in the visualized distal
esophagus. Small hiatal hernia. No bowel obstruction, free air or
pneumatosis. Colonic diverticulosis. No bowel wall edema. Appendix
is normal.

Vascular/Lymphatic: No significant vascular findings are present. No
enlarged abdominal or pelvic lymph nodes.

Reproductive: Status post hysterectomy. No adnexal masses.

Other: No ascites.

Musculoskeletal: No suspicious bony lesions.
IMPRESSION: 1. 5 mm calculus in the lower pole right kidney which appears to
obstruct the lower pole calices. Associated perinephric and
retroperitoneal fat stranding around the lower right kidney and
inferiorly.
2. Small left renal calculus.
3. Colonic diverticulosis.
4. Small hiatal hernia. Fluid in the visualized distal esophagus,
correlate for reflux.

## 2022-07-13 IMAGING — XA IR NEPHROSTOMY PLACEMENT RIGHT
1 series · 13 of 24 positions shown · non-contrast
Comparison: CT 07/09/2021

INDICATION: 75-year-old female presents to the hospital with urosepsis secondary
2 a stone in the lower pole collecting system infundibulum, with
local pelvicaliectasis of the lower pole.

She has been referred for attempt at PCN.
EXAM:
IMAGE GUIDED PERCUTANEOUS NEPHROSTOMY

[Series 1: processed: ir nephrostomy placement righ · 9 acquisitions, 13 frames shown]
[im 1/9]
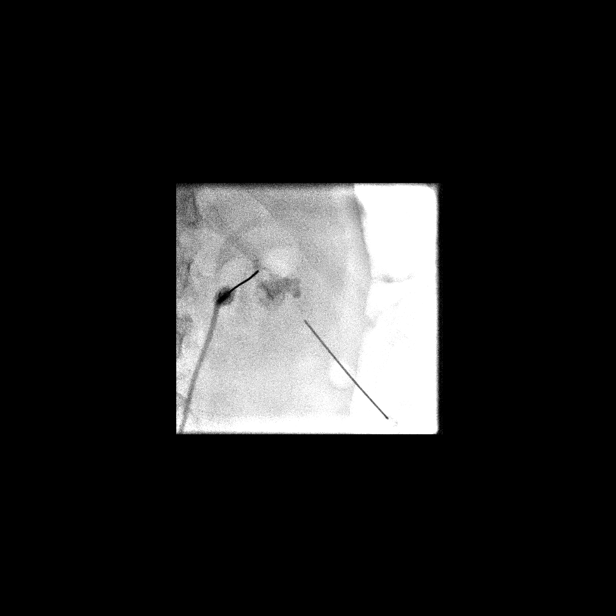
[im 1/9]
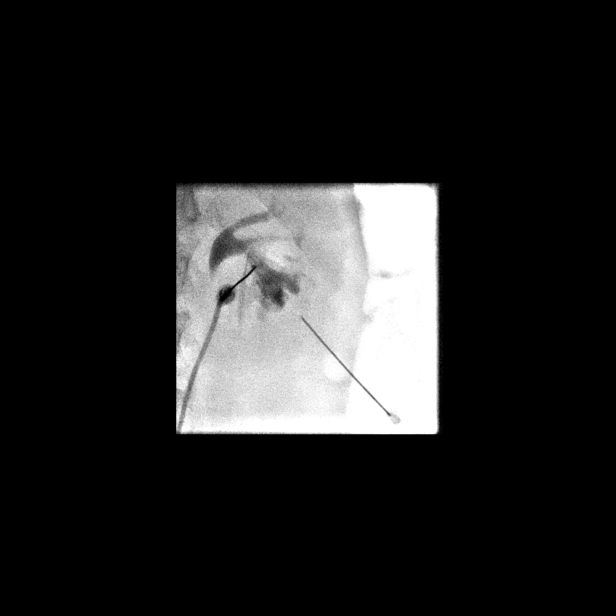
[im 2/9]
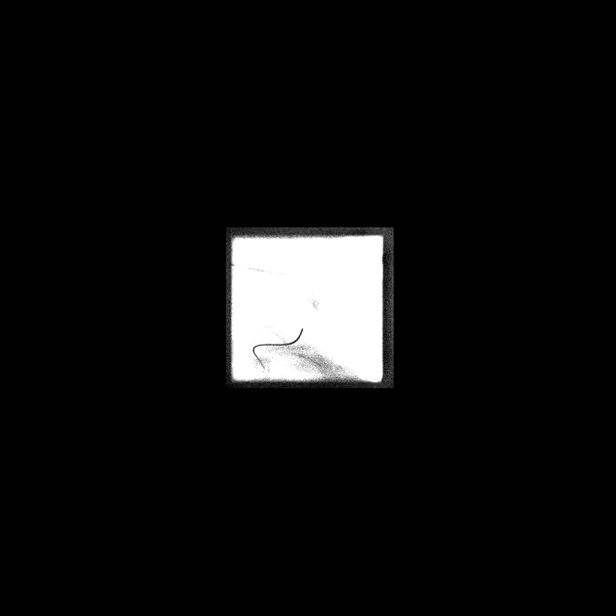
[im 3/9]
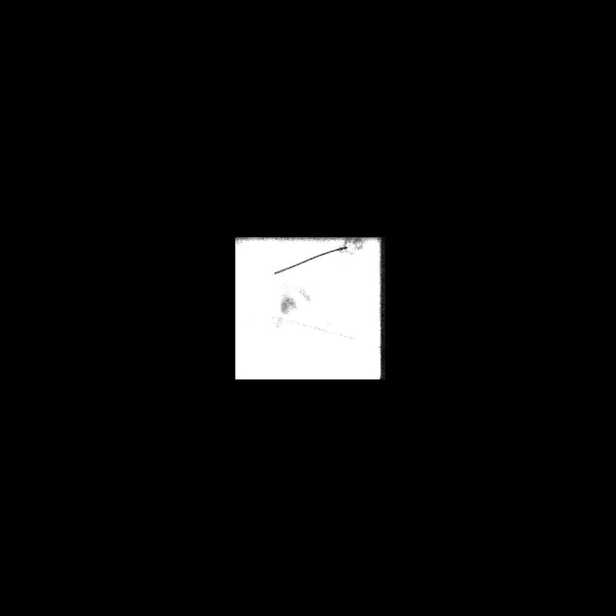
[im 4/9]
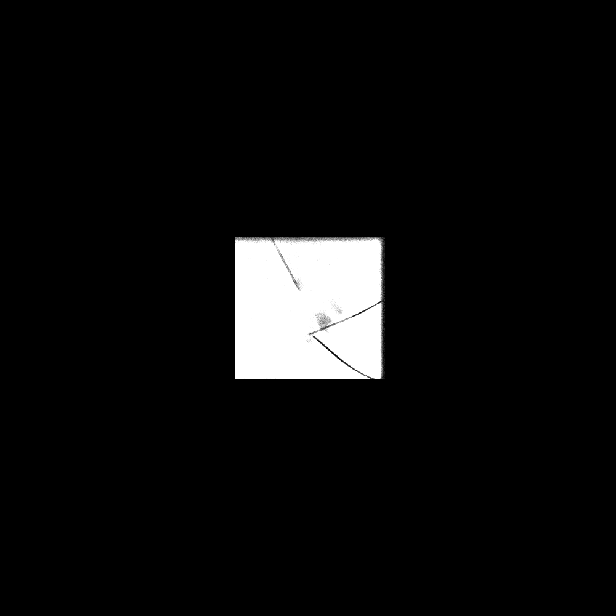
[im 5/9]
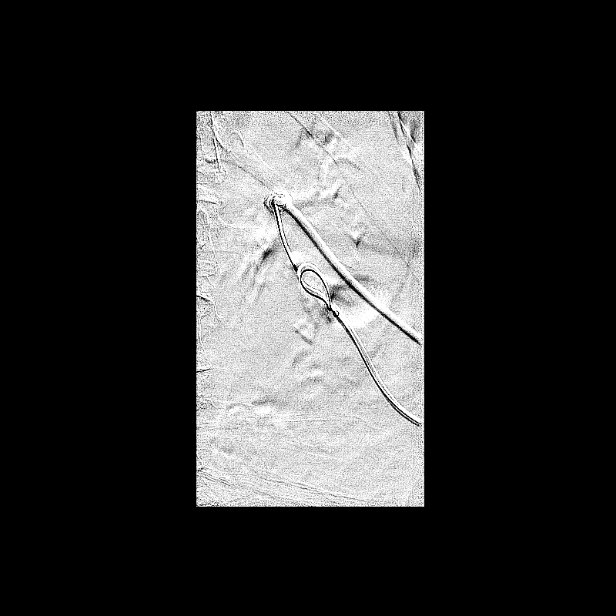
[im 5/9]
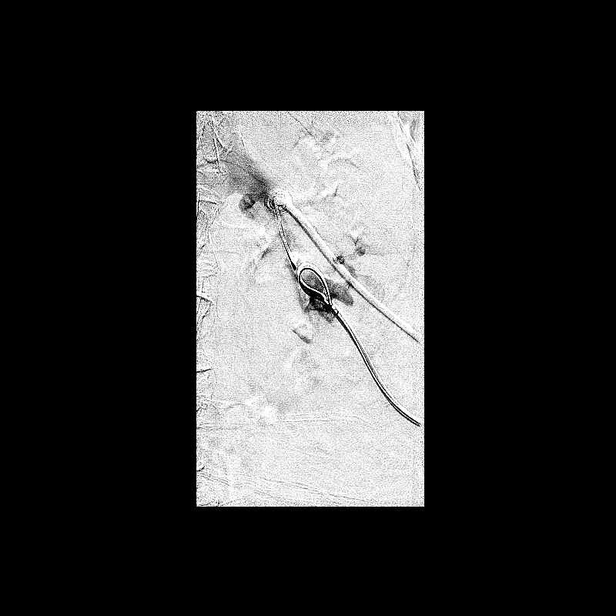
[im 6/9]
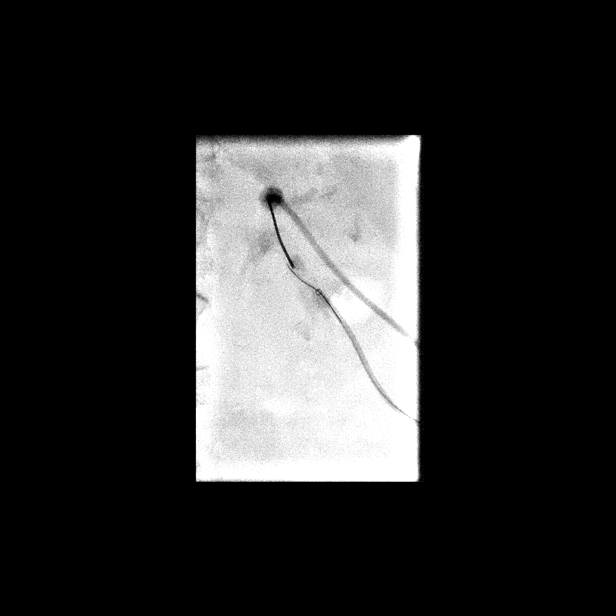
[im 6/9]
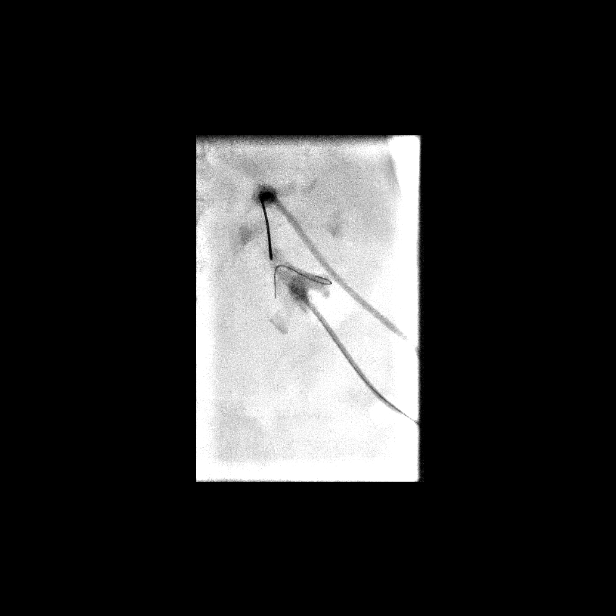
[im 7/9]
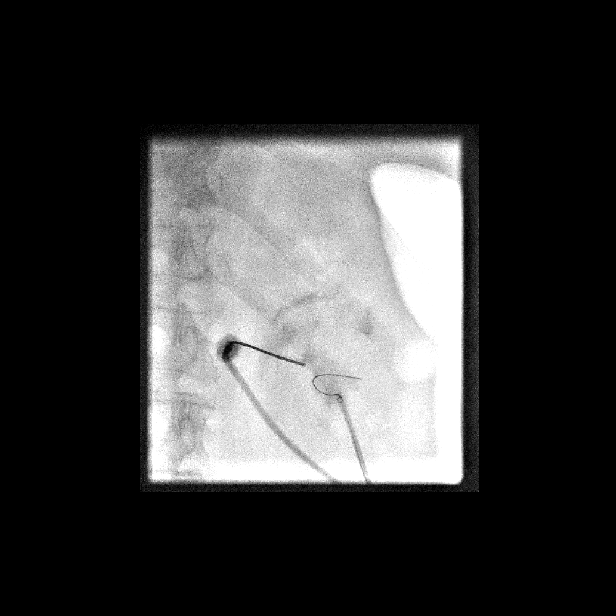
[im 8/9]
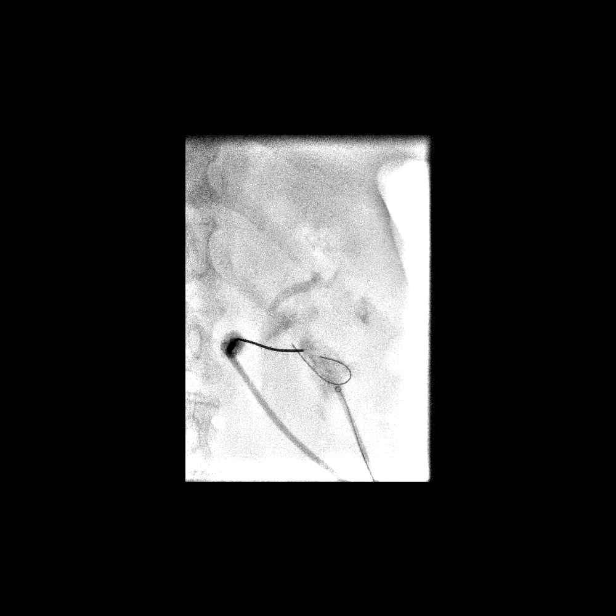
[im 9/9]
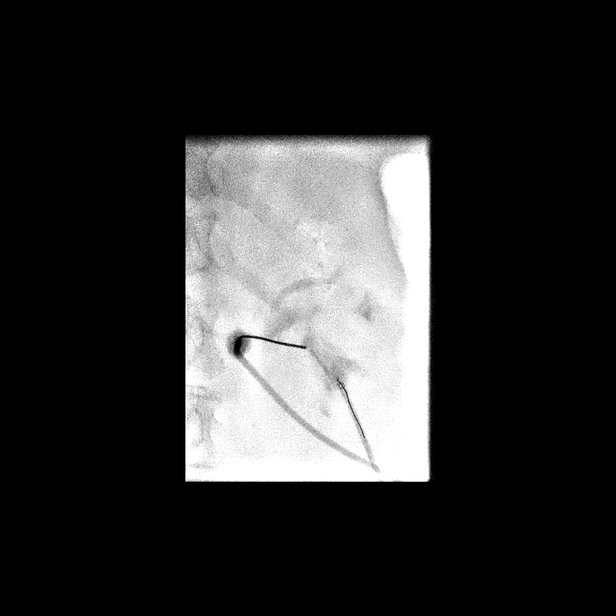
[im 9/9]
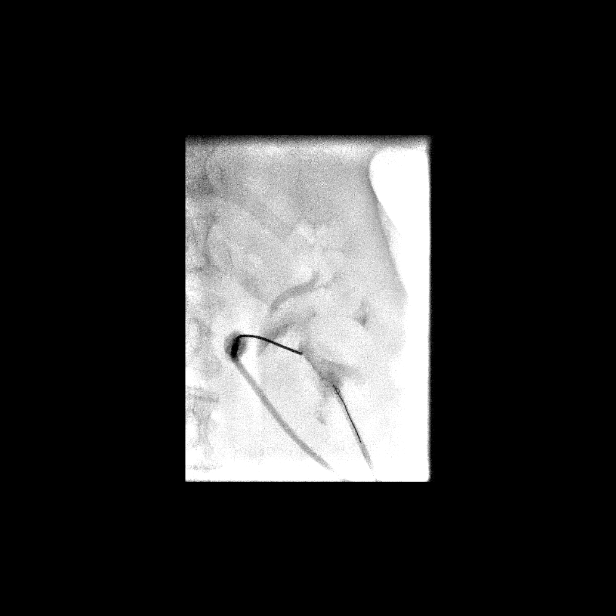

[13 of 24 positions shown; findings below may reference images not displayed]

MEDICATIONS:
Ciprofloxacin 400 mg IV; The antibiotic was administered in an
appropriate time frame prior to skin puncture.

ANESTHESIA/SEDATION:
Moderate (conscious) sedation was employed during this procedure. A
total of Versed 3.5 mg and Fentanyl 200 mcg was administered
intravenously by the radiology nurse.

Total intra-service moderate Sedation Time: 98 minutes. The
patient's level of consciousness and vital signs were monitored
continuously by radiology nursing throughout the procedure under my
direct supervision.

CONTRAST:  80 cc-administered into the collecting system(s)

FLUOROSCOPY TIME:  Fluoroscopy Time: 41 minutes 24 seconds (599
mGy).

COMPLICATIONS:
SIR LEVEL B - Normal therapy, includes overnight admission for
observation.

Intravasation of contrast at the conclusion of the case, with
bacteremia/rigors

PROCEDURE:
Informed written consent was obtained from the patient after a
thorough discussion of the procedural risks, benefits and
alternatives. All questions were addressed. Maximal Sterile Barrier
Technique was utilized including caps, mask, sterile gowns, sterile
gloves, sterile drape, hand hygiene and skin antiseptic. A timeout
was performed prior to the initiation of the procedure.

Patient positioned prone position on the fluoroscopy table.
Ultrasound survey of the right flank was performed with images
stored and sent to PACs.

The patient was then prepped and draped in the usual sterile
fashion. 1% lidocaine was used to anesthetize the skin and
subcutaneous tissues for local anesthesia.

An Envy/chiba needle was then used in attempt to access the mildly
dilated posterior inferior calyx with ultrasound guidance. After 3
attempts, we elected to forego ultrasound.

A second needle puncture was then performed, targeting the
infundibulum stone with a second needle from an AP approach. 1%
lidocaine was used for local anesthesia, and the needle was then
used to puncture the infundibulum at the site of the stone.

Contrast opacification was performed.

The second needle was then used in attempt to access the opacified
lower pole and most posterior calyx. On the initial needle puncture,
the needle tip was within the lower pole posterior calyx, however,
the initial angulation was not favorable for passing the wire.

A second more inferior needle approach was then performed, for a
favorable angle into the calyx. On this second needle puncture the
wire was passed into the lower pole collecting system calyx, though
would not traverse the region the impacted stone.

The Envy tri-axial system was then advanced over the wire into the
collecting system under fluoroscopy. The metal stiffener and inner
dilator were removed, and contrast was injected. Contrast was
clearly not within the collecting system/lower pole calyx on this
passage of the triaxial system. The 4 French sheath was removed.

At this point, there was not a needle within the collecting system,
and another puncture of the stone was attempted. From here forward,
all of the contrast was injected demonstrated some element of venous
intravasation.

The same needle trajectory from a caudal right posterior oblique
approach was performed into the collapsed collecting system.
Multiple needle punctures were performed, with only partial
opacification.

Ultimately the end the triaxial system was again past, which was
only partially within the collapse collecting system. Multiple
attempts of passing a 018 wire was performed, ultimately not within
the lower pole calyx.

The patient began to experience rigors after approximately 1 hour
and 30 minutes of working time, 41 minutes of fluoroscopy time. We
aborted given that there was a clear venous communication with the
disrupted lower pole calyx.

30 cc estimated blood loss.
IMPRESSION: Status post image guided attempt at a percutaneous nephrostomy of
the isolated obstruction of the right lower pole calyx secondary to
impacted infundibular stone. Ultimately, we were unable to place the
catheter in the small space.

## 2022-08-10 DIAGNOSIS — Z6830 Body mass index (BMI) 30.0-30.9, adult: Secondary | ICD-10-CM | POA: Diagnosis not present

## 2022-08-10 DIAGNOSIS — Z131 Encounter for screening for diabetes mellitus: Secondary | ICD-10-CM | POA: Diagnosis not present

## 2022-08-10 DIAGNOSIS — R0602 Shortness of breath: Secondary | ICD-10-CM | POA: Diagnosis not present

## 2022-08-10 DIAGNOSIS — Z0130 Encounter for examination of blood pressure without abnormal findings: Secondary | ICD-10-CM | POA: Diagnosis not present

## 2022-08-10 DIAGNOSIS — E559 Vitamin D deficiency, unspecified: Secondary | ICD-10-CM | POA: Diagnosis not present

## 2022-08-10 DIAGNOSIS — R5383 Other fatigue: Secondary | ICD-10-CM | POA: Diagnosis not present

## 2022-08-10 DIAGNOSIS — E7800 Pure hypercholesterolemia, unspecified: Secondary | ICD-10-CM | POA: Diagnosis not present

## 2022-08-10 DIAGNOSIS — Z0000 Encounter for general adult medical examination without abnormal findings: Secondary | ICD-10-CM | POA: Diagnosis not present

## 2022-08-10 DIAGNOSIS — Z79899 Other long term (current) drug therapy: Secondary | ICD-10-CM | POA: Diagnosis not present

## 2022-11-01 DIAGNOSIS — Z6830 Body mass index (BMI) 30.0-30.9, adult: Secondary | ICD-10-CM | POA: Diagnosis not present

## 2022-11-01 DIAGNOSIS — E559 Vitamin D deficiency, unspecified: Secondary | ICD-10-CM | POA: Diagnosis not present

## 2022-11-01 DIAGNOSIS — I10 Essential (primary) hypertension: Secondary | ICD-10-CM | POA: Diagnosis not present

## 2022-11-01 DIAGNOSIS — E7800 Pure hypercholesterolemia, unspecified: Secondary | ICD-10-CM | POA: Diagnosis not present

## 2022-11-01 DIAGNOSIS — F5101 Primary insomnia: Secondary | ICD-10-CM | POA: Diagnosis not present

## 2022-11-01 DIAGNOSIS — I2510 Atherosclerotic heart disease of native coronary artery without angina pectoris: Secondary | ICD-10-CM | POA: Diagnosis not present

## 2022-11-01 DIAGNOSIS — R0602 Shortness of breath: Secondary | ICD-10-CM | POA: Diagnosis not present

## 2022-11-01 DIAGNOSIS — Z0130 Encounter for examination of blood pressure without abnormal findings: Secondary | ICD-10-CM | POA: Diagnosis not present

## 2022-11-01 DIAGNOSIS — R5383 Other fatigue: Secondary | ICD-10-CM | POA: Diagnosis not present

## 2022-11-01 DIAGNOSIS — F419 Anxiety disorder, unspecified: Secondary | ICD-10-CM | POA: Diagnosis not present

## 2022-11-01 DIAGNOSIS — L988 Other specified disorders of the skin and subcutaneous tissue: Secondary | ICD-10-CM | POA: Diagnosis not present

## 2022-11-01 DIAGNOSIS — Z79899 Other long term (current) drug therapy: Secondary | ICD-10-CM | POA: Diagnosis not present

## 2022-11-01 DIAGNOSIS — Z131 Encounter for screening for diabetes mellitus: Secondary | ICD-10-CM | POA: Diagnosis not present

## 2022-11-01 DIAGNOSIS — F32A Depression, unspecified: Secondary | ICD-10-CM | POA: Diagnosis not present

## 2022-12-16 ENCOUNTER — Emergency Department (HOSPITAL_BASED_OUTPATIENT_CLINIC_OR_DEPARTMENT_OTHER)
Admission: EM | Admit: 2022-12-16 | Discharge: 2022-12-16 | Disposition: A | Discharge status: Home / Self Care | Payer: Medicare HMO | Attending: Emergency Medicine | Admitting: Emergency Medicine

## 2022-12-16 ENCOUNTER — Encounter (HOSPITAL_BASED_OUTPATIENT_CLINIC_OR_DEPARTMENT_OTHER): Payer: Self-pay | Admitting: Emergency Medicine

## 2022-12-16 DIAGNOSIS — R300 Dysuria: Secondary | ICD-10-CM | POA: Diagnosis not present

## 2022-12-16 DIAGNOSIS — N390 Urinary tract infection, site not specified: Secondary | ICD-10-CM | POA: Insufficient documentation

## 2022-12-16 DIAGNOSIS — Z7901 Long term (current) use of anticoagulants: Secondary | ICD-10-CM | POA: Diagnosis not present

## 2022-12-16 HISTORY — DX: Essential (primary) hypertension: I10

## 2022-12-16 LAB — BASIC METABOLIC PANEL
Anion gap: 8 (ref 5–15)
BUN: 23 mg/dL (ref 8–23)
CO2: 23 mmol/L (ref 22–32)
Calcium: 9.7 mg/dL (ref 8.9–10.3)
Chloride: 106 mmol/L (ref 98–111)
Creatinine, Ser: 1.07 mg/dL — ABNORMAL HIGH (ref 0.44–1.00)
GFR, Estimated: 54 mL/min — ABNORMAL LOW (ref >60)
Glucose, Bld: 102 mg/dL — ABNORMAL HIGH (ref 70–99)
Potassium: 4.7 mmol/L (ref 3.5–5.1)
Sodium: 137 mmol/L (ref 135–145)

## 2022-12-16 LAB — URINALYSIS, ROUTINE W REFLEX MICROSCOPIC
Bilirubin Urine: NEGATIVE
Glucose, UA: NEGATIVE mg/dL
Ketones, ur: NEGATIVE mg/dL
Nitrite: NEGATIVE
Specific Gravity, Urine: 1.018 (ref 1.005–1.030)
WBC, UA: >50 WBC/hpf (ref 0–5)
pH: 6 (ref 5.0–8.0)

## 2022-12-16 LAB — CBC WITH DIFFERENTIAL/PLATELET
Abs Immature Granulocytes: 0.02 10*3/uL (ref 0.00–0.07)
Basophils Absolute: 0.1 10*3/uL (ref 0.0–0.1)
Basophils Relative: 1 %
Eosinophils Absolute: 0.2 10*3/uL (ref 0.0–0.5)
Eosinophils Relative: 4 %
HCT: 42 % (ref 36.0–46.0)
Hemoglobin: 14.7 g/dL (ref 12.0–15.0)
Immature Granulocytes: 0 %
Lymphocytes Relative: 24 %
Lymphs Abs: 1.3 10*3/uL (ref 0.7–4.0)
MCH: 31.1 pg (ref 26.0–34.0)
MCHC: 35 g/dL (ref 30.0–36.0)
MCV: 89 fL (ref 80.0–100.0)
Monocytes Absolute: 0.5 10*3/uL (ref 0.1–1.0)
Monocytes Relative: 9 %
Neutro Abs: 3.4 10*3/uL (ref 1.7–7.7)
Neutrophils Relative %: 62 %
Platelets: 208 10*3/uL (ref 150–400)
RBC: 4.72 MIL/uL (ref 3.87–5.11)
RDW: 12.7 % (ref 11.5–15.5)
WBC: 5.4 10*3/uL (ref 4.0–10.5)
nRBC: 0 % (ref 0.0–0.2)

## 2022-12-16 MED ORDER — SODIUM CHLORIDE 0.9 % IV SOLN
1.0000 g | Freq: Once | INTRAVENOUS | Status: AC
  Administered 2022-12-16: 1 g via INTRAVENOUS
  Filled 2022-12-16: qty 10

## 2022-12-16 MED ORDER — CEFDINIR 300 MG PO CAPS: 300.0000 mg | ORAL_CAPSULE | Freq: Two times a day (BID) | ORAL | 0 refills | Status: AC

## 2022-12-16 NOTE — ED Provider Notes (Signed)
[place] EMERGENCY DEPARTMENT AT [facility] Provider Note   CSN: [id number] Arrival date & time: 12/16/22  1121     History  Chief Complaint  Patient presents with   Dysuria    Colleen Reynolds is a 77 y.o. female presented to ED with complaint of dysuria, ongoing for 2 weeks.  She reports intermittent back pain as well.  Denies fevers or chills.  She is concerned because in January she had a UTI that became E. coli bacteremia and sepsis, for which she was quite ill.  She does report cloudy and dark urine, foul-smelling.  HPI     Home Medications Prior to Admission medications   Medication Sig Start Date End Date Taking? Authorizing Provider  cefdinir (OMNICEF) 300 MG capsule Take 1 capsule (300 mg total) by mouth 2 (two) times daily for 7 days. 12/17/22 12/24/22 Yes Breelynn Bankert, Kermit Balo, MD  acetaminophen (TYLENOL) 500 MG tablet Take 1,000 mg by mouth every 8 (eight) hours as needed for moderate pain. Patient not taking: Reported on 11/20/2021    [provider]  apixaban (ELIQUIS) 5 MG TABS tablet Take 1 tablet (5 mg total) by mouth 2 (two) times daily. 05/14/21   Deeann Saint, MD  carvedilol (COREG) 6.25 MG tablet Take 1 tablet (6.25 mg total) by mouth 2 (two) times daily with a meal. 07/13/21   Erick Blinks, MD  cefadroxil (DURICEF) 500 MG capsule Take 2 capsules (1,000 mg total) by mouth 2 (two) times daily. Patient not taking: Reported on 11/20/2021 07/13/21   Erick Blinks, MD  famotidine (PEPCID) 20 MG tablet Take 20 mg by mouth daily.     [provider]  FLUoxetine (PROZAC) 40 MG capsule TAKE 1 CAPSULE BY MOUTH EVERY DAY 03/29/22   Deeann Saint, MD  prednisoLONE acetate (PRED FORTE) 1 % ophthalmic suspension Place 1 drop into the left eye daily as needed (eye pain). Patient not taking: Reported on 11/20/2021    [provider]  valACYclovir (VALTREX) 1000 MG tablet Take 1 tablet (1,000 mg total) by mouth daily. 12/10/20   Deeann Saint, MD      Allergies    Clindamycin/lincomycin, Lovastatin, and Penicillins    Review of Systems   Review of Systems  Physical Exam Updated Vital Signs BP 116/72   Pulse 60   Temp 97.9 F (36.6 C)   Resp 18   Wt 79.4 kg   SpO2 100%   BMI 30.04 kg/m  Physical Exam Constitutional:      General: She is not in acute distress. HENT:     Head: Normocephalic and atraumatic.  Eyes:     Conjunctiva/sclera: Conjunctivae normal.     Pupils: Pupils are equal, round, and reactive to light.  Cardiovascular:     Rate and Rhythm: Normal rate and regular rhythm.  Pulmonary:     Effort: Pulmonary effort is normal. No respiratory distress.  Abdominal:     General: There is no distension.     Tenderness: There is no abdominal tenderness.  Skin:    General: Skin is warm and dry.  Neurological:     General: No focal deficit present.     Mental Status: She is alert. Mental status is at baseline.  Psychiatric:        Mood and Affect: Mood normal.        Behavior: Behavior normal.     ED Results / Procedures / Treatments   Labs (all labs ordered are listed, but  only abnormal results are displayed) Labs Reviewed  URINALYSIS, ROUTINE W REFLEX MICROSCOPIC - Abnormal; Notable for the following components:      Result Value   APPearance CLOUDY (*)    Hgb urine dipstick SMALL (*)    Protein, ur TRACE (*)    Leukocytes,Ua LARGE (*)    Bacteria, UA FEW (*)    All other components within normal limits  BASIC METABOLIC PANEL - Abnormal; Notable for the following components:   Glucose, Bld 102 (*)    Creatinine, Ser 1.07 (*)    GFR, Estimated 54 (*)    All other components within normal limits  URINE CULTURE  CBC WITH DIFFERENTIAL/PLATELET    EKG None  Radiology No results found.  Procedures Procedures    Medications Ordered in ED Medications  cefTRIAXone (ROCEPHIN) 1 g in sodium chloride 0.9 % 100 mL IVPB (0 g Intravenous Stopped 12/16/22 1341)    ED Course/  Medical Decision Making/ A&P                             Medical Decision Making Amount and/or Complexity of Data Reviewed Labs: ordered.  Risk Prescription drug management.   This is a well-appearing patient presented to ED with complaint of dysuria for 2 weeks.  She is afebrile does not meet SIRS or sepsis criteria on arrival.  I did review external records including her blood cultures from January which were positive for pansensitive E. coli.  I will check her kidney function as well as her white blood cell count.  We have ordered IV Rocephin.  There is no clear evidence of sepsis or bacteremia at this time.  Her UA is suggestive however of UTI with large leukocytes.  We will send a urine culture.  Given that she is clinically well-appearing, with symptoms ongoing for 2 weeks now, I think it would also be reasonable to treat her with a round of Rocephin in the ED and then antibiotics at home.  I do not see any clinical evidence of sepsis at this time, to warrant emergent hospitalization.  The patient is in agreement with this plan, and quite comfortable and hoping to go home.         Final Clinical Impression(s) / ED Diagnoses Final diagnoses:  Urinary tract infection without hematuria, site unspecified    Rx / DC Orders ED Discharge Orders          Ordered    cefdinir (OMNICEF) 300 MG capsule  2 times daily        12/16/22 1334              Terald Sleeper, MD 12/16/22 1526

## 2022-12-16 NOTE — ED Triage Notes (Signed)
Pt arrives pov, steady gait with c/o concern for "bladder infection". Reports dysuria and urgency x 2 weeks. Endorses bilateral back pain

## 2022-12-18 LAB — URINE CULTURE: Culture: >=100000 — AB

## 2022-12-19 ENCOUNTER — Telehealth (HOSPITAL_BASED_OUTPATIENT_CLINIC_OR_DEPARTMENT_OTHER): Payer: Self-pay

## 2022-12-19 NOTE — Telephone Encounter (Signed)
Post ED Visit - Positive Culture Follow-up  Culture report reviewed by antimicrobial stewardship pharmacist: [facility] [x]  Estill Batten, Pharm.D. BCCCP []  Celedonio Miyamoto, [address].D., BCPS AQ-ID []  Garvin Fila, Pharm.D., BCPS []  Georgina Pillion, [address].D., BCPS []  [place], [address].D., BCPS, AAHIVP []  Estella Husk, Pharm.D., BCPS, AAHIVP []  Lysle Pearl, PharmD, BCPS []  Phillips Climes, PharmD, BCPS []  Agapito Games, PharmD, BCPS []  Verlan Friends, PharmD []  Mervyn Gay, PharmD, BCPS []  Vinnie Level, PharmD  [facility] []  Len Childs, PharmD []  Greer Pickerel, PharmD []  Adalberto Cole, PharmD []  Perlie Gold, Rph []  Lonell Face) Jean Rosenthal, PharmD []  Earl Many, PharmD []  Junita Push, PharmD []  Dorna Leitz, PharmD []  Terrilee Files, PharmD []  Lynann Beaver, PharmD []  Keturah Barre, PharmD []  Loralee Pacas, PharmD []  Bernadene Person, PharmD   Positive urine culture Treated with Cefdinir, organism sensitive to the same and no further patient follow-up is required at this time.  Sandria Senter 12/19/2022, 11:48 AM

## 2023-01-13 ENCOUNTER — Encounter: Payer: Self-pay | Admitting: Adult Health

## 2023-01-13 ENCOUNTER — Ambulatory Visit (INDEPENDENT_AMBULATORY_CARE_PROVIDER_SITE_OTHER): Payer: Medicare HMO | Admitting: Adult Health

## 2023-01-13 VITALS — BP 120/60 | HR 53 | Temp 98.5°F | Ht 64.0 in | Wt 174.0 lb

## 2023-01-13 DIAGNOSIS — I4891 Unspecified atrial fibrillation: Secondary | ICD-10-CM

## 2023-01-13 DIAGNOSIS — M810 Age-related osteoporosis without current pathological fracture: Secondary | ICD-10-CM

## 2023-01-13 DIAGNOSIS — F419 Anxiety disorder, unspecified: Secondary | ICD-10-CM

## 2023-01-13 DIAGNOSIS — F32A Depression, unspecified: Secondary | ICD-10-CM

## 2023-01-13 DIAGNOSIS — E782 Mixed hyperlipidemia: Secondary | ICD-10-CM | POA: Diagnosis not present

## 2023-01-13 DIAGNOSIS — Z7689 Persons encountering health services in other specified circumstances: Secondary | ICD-10-CM

## 2023-01-13 DIAGNOSIS — I10 Essential (primary) hypertension: Secondary | ICD-10-CM

## 2023-01-13 DIAGNOSIS — N200 Calculus of kidney: Secondary | ICD-10-CM | POA: Diagnosis not present

## 2023-01-13 NOTE — Progress Notes (Signed)
Patient presents to clinic today to establish care. She is a pleasant 77 year old female who  has a past medical history of Anemia, Atrial fibrillation (HCC), Depression, Diverticulosis, GERD (gastroesophageal reflux disease), History of kidney stones, Hypertension, Kidney stones (2008), OA (osteoarthritis) of hip, Osteopenia, Shingles, and Vitamin D deficiency.  She was previously seen by at [facility]. Her last CPE was a few months ago.   Acute Concerns: Establish Care   Chronic Issues:  Atrial Fibrillation - No longer seeing Cardiology. Managed with  Coreg 6.25 mg BID for rate control.She has not taken Eliquis in close to two years. She stopped due to price. She is taking 81 mg ASA. She does not feel when she goes in and out of A fib.   Hypertension - well controlled with Coreg 6.25 mg BID. She has boarderline LVH on echo and a mildly dilated left atrium.   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 Her last lipid panel was in 2019.   Lab Results  Component Value Date   CHOL 242 (H) 03/23/2018   HDL 63.80 03/23/2018   LDLCALC 159 (H) 03/23/2018   TRIG 96.0 03/23/2018   CHOLHDL 4 03/23/2018   Osteoporosis - Was on Fosamax but stopped after five years. She has not been on anything since.  Anxiety and Depression - takes Prozac 40 mg daily and klonopin 0.25 mg PRN. This was prescribed by Primary Care. Biggest source of anxiety and depression is her husbands health.   Kidney Stones -  was admitted in 07/2021 for sepsis due to kidney stone. She did have stent placement. Had surgical procedure for stone removal. Believes she has a few stones left   Health Maintenance: Dental - Routine Care  Vision -- Routine Care Immunizations -- UTD.  Colonoscopy -- Had one in the past >10 years ago. She had polyps and was advised to follow up for repeat but never  went. She refuses repeat colonoscopy at this time  Mammogram -- last was >10 years ago. Refuses PAP -- No longer indicated.  Bone Density -- last bone density in 2016 with a T score of - 2.6    Past Medical History:  Diagnosis Date   Anemia    Atrial fibrillation (HCC)    Depression    Diverticulosis    GERD (gastroesophageal reflux disease)    History of kidney stones    Hypertension    Kidney stones 2008   OA (osteoarthritis) of hip    Osteopenia    Shingles    Vitamin D deficiency     Past Surgical History:  Procedure Laterality Date   ABDOMINAL HYSTERECTOMY     BREAST REDUCTION SURGERY  1985   COLONOSCOPY  2005   CYSTOSCOPY W/ URETERAL STENT PLACEMENT Right 07/11/2021   Procedure: CYSTOSCOPY WITH RETROGRADE PYELOGRAM/URETERAL STENT PLACEMENT;  Surgeon: Crist Fat, MD;  Location: [facility];  Service: Urology;  Laterality: Right;   CYSTOSCOPY/URETEROSCOPY/HOLMIUM LASER/STENT PLACEMENT Right 07/27/2021   Procedure: CYSTOSCOPY, RETROGRADE PYELOGRAM, URETEROSCOPY, HOLMIUM LASER, STENT PLACEMENT;  Surgeon: Jannifer Hick, MD;  Location: [facility];  Service: Urology;  Laterality: Right;  ONLY NEEDS 60 MIN   FRACTURE SURGERY Left 2020   dislocated left elbow    IR NEPHROSTOMY PLACEMENT RIGHT  07/10/2021   KNEE SURGERY     1967   LITHOTRIPSY  2008    Current  Outpatient Medications on File Prior to Visit  Medication Sig Dispense Refill   acetaminophen (TYLENOL) 500 MG tablet Take 1,000 mg by mouth every 8 (eight) hours as needed for moderate pain. (Patient not taking: Reported on 11/20/2021)     apixaban (ELIQUIS) 5 MG TABS tablet Take 1 tablet (5 mg total) by mouth 2 (two) times daily. 60 tablet 11   carvedilol (COREG) 6.25 MG tablet Take 1 tablet (6.25 mg total) by mouth 2 (two) times daily with a meal. 60 tablet 1   cefadroxil (DURICEF) 500 MG capsule Take 2 capsules (1,000 mg total) by mouth 2 (two) times daily. (Patient not taking: Reported on 11/20/2021) 40 capsule 0    famotidine (PEPCID) 20 MG tablet Take 20 mg by mouth daily.      FLUoxetine (PROZAC) 40 MG capsule TAKE 1 CAPSULE BY MOUTH EVERY DAY 90 capsule 1   prednisoLONE acetate (PRED FORTE) 1 % ophthalmic suspension Place 1 drop into the left eye daily as needed (eye pain). (Patient not taking: Reported on 11/20/2021)     valACYclovir (VALTREX) 1000 MG tablet Take 1 tablet (1,000 mg total) by mouth daily. 90 tablet 3   Current Facility-Administered Medications on File Prior to Visit  Medication Dose Route Frequency Provider Last Rate Last Admin   0.9 %  sodium chloride infusion  500 mL Intravenous Continuous Nandigam, Kavitha V, MD        Allergies  Allergen Reactions   Clindamycin/Lincomycin Nausea And Vomiting   Lovastatin Other (See Comments)    Fatigue, muscle aches   Penicillins Rash    Tolerated Rocephin 07/10/21    Family History  Problem Relation Age of Onset   Colon cancer Neg Hx    Esophageal cancer Neg Hx    Rectal cancer Neg Hx    Stomach cancer Neg Hx     Social History   Socioeconomic History   Marital status: Married    Spouse name: Not on file   Number of children: Not on file   Years of education: Not on file   Highest education level: Not on file  Occupational History   Not on file  Tobacco Use   Smoking status: Never   Smokeless tobacco: Never  Vaping Use   Vaping status: Never Used  Substance and Sexual Activity   Alcohol use: Yes    Comment: socially   Drug use: No   Sexual activity: Not on file  Other Topics Concern   Not on file  Social History Narrative   Not on file   Social Determinants of Health   Financial Resource Strain: Low Risk  (08/13/2020)   Overall Financial Resource Strain (CARDIA)    Difficulty of Paying Living Expenses: Not hard at all  Food Insecurity: No Food Insecurity (08/13/2020)   Hunger Vital Sign    Worried About Running Out of Food in the Last Year: Never true    Ran Out of Food in the Last Year: Never true  Transportation  Needs: No Transportation Needs (08/13/2020)   PRAPARE - Administrator, Civil Service (Medical): No    Lack of Transportation (Non-Medical): No  Physical Activity: Inactive (08/13/2020)   Exercise Vital Sign    Days of Exercise per Week: 0 days    Minutes of Exercise per Session: 0 min  Stress: No Stress Concern Present (08/13/2020)   [employer] of Occupational Health - Occupational Stress Questionnaire    Feeling of Stress : Not at all  Social Connections:  Moderately Isolated (08/13/2020)   Social Connection and Isolation Panel [NHANES]    Frequency of Communication with Friends and Family: More than three times a week    Frequency of Social Gatherings with Friends and Family: Once a week    Attends Religious Services: Never    Database administrator or Organizations: No    Attends Banker Meetings: Never    Marital Status: Married  Catering manager Violence: Not At Risk (08/13/2020)   Humiliation, Afraid, Rape, and Kick questionnaire    Fear of Current or Ex-Partner: No    Emotionally Abused: No    Physically Abused: No    Sexually Abused: No    ROS  There were no vitals taken for this visit.  Physical Exam  Recent Results (from the past 2160 hour(s))  Urinalysis, Routine w reflex microscopic -Urine, Clean Catch     Status: Abnormal   Collection Time: 12/16/22 11:43 AM  Result Value Ref Range   Color, Urine YELLOW YELLOW   APPearance CLOUDY (A) CLEAR   Specific Gravity, Urine 1.018 1.005 - 1.030   pH 6.0 5.0 - 8.0   Glucose, UA NEGATIVE NEGATIVE mg/dL   Hgb urine dipstick SMALL (A) NEGATIVE   Bilirubin Urine NEGATIVE NEGATIVE   Ketones, ur NEGATIVE NEGATIVE mg/dL   Protein, ur TRACE (A) NEGATIVE mg/dL   Nitrite NEGATIVE NEGATIVE   Leukocytes,Ua LARGE (A) NEGATIVE   RBC / HPF 0-5 0 - 5 RBC/hpf   WBC, UA >50 0 - 5 WBC/hpf   Bacteria, UA FEW (A) NONE SEEN   Squamous Epithelial / HPF 0-5 0 - 5 /HPF   WBC Clumps PRESENT    Budding Yeast PRESENT      Comment: Performed at [facility], [address] [place], [place] [address]  Urine Culture     Status: Abnormal   Collection Time: 12/16/22 11:43 AM   Specimen: Urine, Clean Catch  Result Value Ref Range   Specimen Description      URINE, CLEAN CATCH Performed at [facility], [address], [place], [place] [address]    Special Requests      NONE Performed at [facility], [address], [place], [place] [address]    Culture >=100,000 COLONIES/mL ESCHERICHIA COLI (A)    Report Status 12/18/2022 FINAL    Organism ID, Bacteria ESCHERICHIA COLI (A)       Susceptibility   Escherichia coli - MIC*    AMPICILLIN 4 SENSITIVE Sensitive     CEFAZOLIN <=4 SENSITIVE Sensitive     CEFEPIME <=0.12 SENSITIVE Sensitive     CEFTRIAXONE <=0.25 SENSITIVE Sensitive     CIPROFLOXACIN <=0.25 SENSITIVE Sensitive     GENTAMICIN <=1 SENSITIVE Sensitive     IMIPENEM <=0.25 SENSITIVE Sensitive     NITROFURANTOIN <=16 SENSITIVE Sensitive     TRIMETH/SULFA <=20 SENSITIVE Sensitive     AMPICILLIN/SULBACTAM <=2 SENSITIVE Sensitive     PIP/TAZO <=4 SENSITIVE Sensitive     * >=100,000 COLONIES/mL ESCHERICHIA COLI  Basic metabolic panel     Status: Abnormal   Collection Time: 12/16/22 12:41 PM  Result Value Ref Range   Sodium 137 135 - 145 mmol/L   Potassium 4.7 3.5 - 5.1 mmol/L   Chloride 106 98 - 111 mmol/L   CO2 23 22 - 32 mmol/L   Glucose, Bld 102 (H) 70 - 99 mg/dL    Comment: Glucose reference range applies only to samples taken after fasting for at least 8 hours.  BUN 23 8 - 23 mg/dL   Creatinine, Ser 4.78 (H) 0.44 - 1.00 mg/dL   Calcium 9.7 8.9 - 29.5 mg/dL   GFR, Estimated 54 (L) >60 mL/min    Comment: (NOTE) Calculated using the CKD-EPI Creatinine Equation (2021)    Anion gap 8 5 - 15    Comment: Performed at [facility], [address] [place], [place] [address]  CBC with Differential     Status: None    Collection Time: 12/16/22 12:41 PM  Result Value Ref Range   WBC 5.4 4.0 - 10.5 K/uL   RBC 4.72 3.87 - 5.11 MIL/uL   Hemoglobin 14.7 12.0 - 15.0 g/dL   HCT 86.5 78.4 - 69.6 %   MCV 89.0 80.0 - 100.0 fL   MCH 31.1 26.0 - 34.0 pg   MCHC 35.0 30.0 - 36.0 g/dL   RDW 29.5 28.4 - 13.2 %   Platelets 208 150 - 400 K/uL   nRBC 0.0 0.0 - 0.2 %   Neutrophils Relative % 62 %   Neutro Abs 3.4 1.7 - 7.7 K/uL   Lymphocytes Relative 24 %   Lymphs Abs 1.3 0.7 - 4.0 K/uL   Monocytes Relative 9 %   Monocytes Absolute 0.5 0.1 - 1.0 K/uL   Eosinophils Relative 4 %   Eosinophils Absolute 0.2 0.0 - 0.5 K/uL   Basophils Relative 1 %   Basophils Absolute 0.1 0.0 - 0.1 K/uL   Immature Granulocytes 0 %   Abs Immature Granulocytes 0.02 0.00 - 0.07 K/uL    Comment: Performed at [facility], [address] [place], [place] [address]    Assessment/Plan: 1. Encounter to establish care - Will get records from [facility]   2. Atrial fibrillation, unspecified type (HCC) - SR today. Highly advised on starting anticoagulation. She will call her insurance company and see if Eliquis or Xarelto is now covered.   3. Renal calculi - Per Urology   4. Mixed hyperlipidemia - Get records from [place] [facility]  - Consider other statn  - Consider CT calcium score   5. Osteoporosis without current pathological fracture, unspecified osteoporosis type  - DG Bone Density; Future - Likely place on Prolia  6. Primary hypertension - Controlled.   7. Anxiety and depression - Controlled.   Shirline Frees, NP   Time spent with patient today was 46 minutes which consisted of chart review, discussing diagnosis, work up, treatment answering questions and documentation.

## 2023-01-13 NOTE — Patient Instructions (Addendum)
Check and see what insurance covers for Eliquis or Xeralto for atrial fibrillation   I will request your records from [facility]   Someone will call you for a bone density screen   Let me know if you need anything

## 2023-01-17 ENCOUNTER — Encounter: Payer: Self-pay | Admitting: Adult Health

## 2023-01-18 ENCOUNTER — Other Ambulatory Visit: Payer: Self-pay | Admitting: Adult Health

## 2023-01-18 DIAGNOSIS — I4891 Unspecified atrial fibrillation: Secondary | ICD-10-CM

## 2023-01-18 MED ORDER — APIXABAN 5 MG PO TABS
5.0000 mg | ORAL_TABLET | Freq: Two times a day (BID) | ORAL | 1 refills | Status: AC
Start: 2023-01-18 — End: 2023-04-18

## 2023-01-18 NOTE — Telephone Encounter (Signed)
Please advise 

## 2023-02-06 ENCOUNTER — Encounter: Payer: Self-pay | Admitting: Adult Health

## 2023-03-01 DIAGNOSIS — H5213 Myopia, bilateral: Secondary | ICD-10-CM | POA: Diagnosis not present

## 2023-03-03 ENCOUNTER — Encounter: Payer: Self-pay | Admitting: Adult Health

## 2023-03-04 NOTE — Telephone Encounter (Signed)
FYI

## 2023-03-16 ENCOUNTER — Ambulatory Visit (INDEPENDENT_AMBULATORY_CARE_PROVIDER_SITE_OTHER): Payer: Medicare HMO

## 2023-03-16 VITALS — Ht 65.0 in | Wt 174.0 lb

## 2023-03-16 DIAGNOSIS — Z0000 Encounter for general adult medical examination without abnormal findings: Secondary | ICD-10-CM

## 2023-03-16 NOTE — Patient Instructions (Addendum)
Ms. Crout , Thank you for taking time to come for your Medicare Wellness Visit. I appreciate your ongoing commitment to your health goals. Please review the following plan we discussed and let me know if I can assist you in the future.   Referrals/Orders/Follow-Ups/Clinician Recommendations:   This is a list of the screening recommended for you and due dates:  Health Maintenance  Topic Date Due   Zoster (Shingles) Vaccine (1 of 2) Never done   Flu Shot  02/03/2023   COVID-19 Vaccine (6 - 2023-24 season) 03/06/2023   Medicare Annual Wellness Visit  03/15/2024   DTaP/Tdap/Td vaccine (4 - Td or Tdap) 10/19/2028   Pneumonia Vaccine  Completed   DEXA scan (bone density measurement)  Completed   Hepatitis C Screening  Completed   HPV Vaccine  Aged Out   Colon Cancer Screening  Discontinued    Advanced directives: (Copy Requested) Please bring a copy of your health care power of attorney and living will to the office to be added to your chart at your convenience.  Next Medicare Annual Wellness Visit scheduled for next year: Yes

## 2023-03-16 NOTE — Progress Notes (Signed)
Subjective:   Colleen Reynolds is a 77 y.o. female who presents for Medicare Annual (Subsequent) preventive examination.  Visit Complete: Virtual  I connected with  Medrith A Curenton on 03/16/23 by a audio enabled telemedicine application and verified that I am speaking with the correct person using two identifiers.  Patient Location: Home  Provider Location: Home Office  I discussed the limitations of evaluation and management by telemedicine. The patient expressed understanding and agreed to proceed.     Review of Systems    Vital Signs: Unable to obtain new vitals due to this being a telehealth visit.  Cardiac Risk Factors include: advanced age (>35men, >28 women)     Objective:    Today's Vitals   03/16/23 0836  Weight: 174 lb (78.9 kg)  Height: 5\' 5"  (1.651 m)   Body mass index is 28.96 kg/m.     03/16/2023    8:44 AM 12/16/2022   11:41 AM 07/23/2021    9:53 AM 07/10/2021    2:00 PM 07/09/2021    9:53 AM 08/13/2020   10:33 AM 06/09/2019   11:30 AM  Advanced Directives  Does Patient Have a Medical Advance Directive? Yes No Yes Yes Yes Yes Yes  Type of Estate agent of [place];Living will  Healthcare Power of Attorney Living will  Healthcare Power of [employer] Power of Attorney  Does patient want to make changes to medical advance directive?    No - Patient declined     Copy of Healthcare Power of Attorney in Chart? No - copy requested  No - copy requested   No - copy requested     Current Medications (verified) Outpatient Encounter Medications as of 03/16/2023  Medication Sig   acetaminophen (TYLENOL) 500 MG tablet Take 1,000 mg by mouth every 8 (eight) hours as needed for moderate pain.   apixaban (ELIQUIS) 5 MG TABS tablet Take 1 tablet (5 mg total) by mouth 2 (two) times daily.   aspirin EC 81 MG tablet Take 81 mg by mouth daily. Swallow whole.   carvedilol (COREG) 6.25 MG tablet Take 1 tablet (6.25 mg total) by mouth 2 (two)  times daily with a meal.   cefadroxil (DURICEF) 500 MG capsule Take 2 capsules (1,000 mg total) by mouth 2 (two) times daily. (Patient not taking: Reported on 11/20/2021)   clonazePAM (KLONOPIN) 0.5 MG tablet Take 0.5 mg by mouth daily. 1/2 tablet up to 4 times daily   diphenhydrAMINE (BENADRYL) 25 mg capsule Take 12 mg by mouth at bedtime. For insomnia   famotidine (PEPCID) 20 MG tablet Take 20 mg by mouth daily.    fexofenadine (ALLEGRA) 180 MG tablet Take 180 mg by mouth as needed for allergies or rhinitis.   FLUoxetine (PROZAC) 40 MG capsule TAKE 1 CAPSULE BY MOUTH EVERY DAY   naproxen sodium (ALEVE) 220 MG tablet Take 220 mg by mouth.   Red Yeast Rice 600 MG CAPS Take 600 mg by mouth in the morning and at bedtime.   tretinoin (RETIN-A) 0.05 % cream Apply topically at bedtime.   Facility-Administered Encounter Medications as of 03/16/2023  Medication   0.9 %  sodium chloride infusion    Allergies (verified) Clindamycin/lincomycin, Lovastatin, and Penicillins   History: Past Medical History:  Diagnosis Date   Anemia    Atrial fibrillation (HCC)    Depression    Diverticulosis    GERD (gastroesophageal reflux disease)    History of kidney stones    Hypertension  Kidney stones 2008   OA (osteoarthritis) of hip    osteoporosis    Shingles    Vitamin D deficiency    Past Surgical History:  Procedure Laterality Date   ABDOMINAL HYSTERECTOMY     brachialrsadius puritis     BREAST REDUCTION SURGERY  1985   COLONOSCOPY  2005   CYSTOSCOPY W/ URETERAL STENT PLACEMENT Right 07/11/2021   Procedure: CYSTOSCOPY WITH RETROGRADE PYELOGRAM/URETERAL STENT PLACEMENT;  Surgeon: Crist Fat, MD;  Location: [facility];  Service: Urology;  Laterality: Right;   CYSTOSCOPY/URETEROSCOPY/HOLMIUM LASER/STENT PLACEMENT Right 07/27/2021   Procedure: CYSTOSCOPY, RETROGRADE PYELOGRAM, URETEROSCOPY, HOLMIUM LASER, STENT PLACEMENT;  Surgeon: Jannifer Hick, MD;  Location: [facility];  Service: Urology;   Laterality: Right;  ONLY NEEDS 60 MIN   FRACTURE SURGERY Left 2020   dislocated left elbow    IR NEPHROSTOMY PLACEMENT RIGHT  07/10/2021   KNEE ARTHROSCOPY WITH EXCISION BAKER'S CYST     KNEE SURGERY     1967   LITHOTRIPSY  2008   Family History  Problem Relation Age of Onset   Stroke Mother    Stroke Father    Cancer Sister    Depression Daughter    Migraines Daughter    Colon cancer Neg Hx    Esophageal cancer Neg Hx    Rectal cancer Neg Hx    Stomach cancer Neg Hx    Social History   Socioeconomic History   Marital status: Married    Spouse name: Not on file   Number of children: Not on file   Years of education: Not on file   Highest education level: Not on file  Occupational History   Not on file  Tobacco Use   Smoking status: Never   Smokeless tobacco: Never  Vaping Use   Vaping status: Never Used  Substance and Sexual Activity   Alcohol use: Yes    Alcohol/week: 1.0 standard drink of alcohol    Types: 1 Standard drinks or equivalent per week    Comment: socially   Drug use: Not Currently    Types: Marijuana    Comment: LSD stopped 1969   Sexual activity: Not on file  Other Topics Concern   Not on file  Social History Narrative   Not on file   Social Determinants of Health   Financial Resource Strain: Low Risk  (03/16/2023)   Overall Financial Resource Strain (CARDIA)    Difficulty of Paying Living Expenses: Not hard at all  Food Insecurity: No Food Insecurity (03/16/2023)   Hunger Vital Sign    Worried About Running Out of Food in the Last Year: Never true    Ran Out of Food in the Last Year: Never true  Transportation Needs: No Transportation Needs (03/16/2023)   PRAPARE - Administrator, Civil Service (Medical): No    Lack of Transportation (Non-Medical): No  Physical Activity: Inactive (03/16/2023)   Exercise Vital Sign    Days of Exercise per Week: 0 days    Minutes of Exercise per Session: 0 min  Stress: No Stress Concern Present  (03/16/2023)   [employer] of Occupational Health - Occupational Stress Questionnaire    Feeling of Stress : Not at all  Social Connections: Moderately Isolated (03/16/2023)   Social Connection and Isolation Panel [NHANES]    Frequency of Communication with Friends and Family: More than three times a week    Frequency of Social Gatherings with Friends and Family: More than three times a week  Attends Religious Services: Never    Active Member of Clubs or Organizations: No    Attends Banker Meetings: Never    Marital Status: Married    Tobacco Counseling Counseling given: Not Answered   Clinical Intake:  Pre-visit preparation completed: Yes  Pain : No/denies pain     BMI - recorded: 28.96 Nutritional Status: BMI 25 -29 Overweight Nutritional Risks: None Diabetes: No  How often do you need to have someone help you when you read instructions, pamphlets, or other written materials from your doctor or pharmacy?: 1 - Never  Interpreter Needed?: No  Information entered by :: Theresa Mulligan LPN   Activities of Daily Living    03/16/2023    8:42 AM  In your present state of health, do you have any difficulty performing the following activities:  Hearing? 0  Vision? 0  Difficulty concentrating or making decisions? 0  Walking or climbing stairs? 0  Dressing or bathing? 0  Doing errands, shopping? 0  Preparing Food and eating ? N  Using the Toilet? N  In the past six months, have you accidently leaked urine? N  Do you have problems with loss of bowel control? N  Managing your Medications? N  Managing your Finances? N  Housekeeping or managing your Housekeeping? N    Patient Care Team: Shirline Frees, NP as PCP - General (Family Medicine) Croitoru, Rachelle Hora, MD as PCP - Cardiology (Cardiology)  Indicate any recent Medical Services you may have received from other than [facility] providers in the past year (date may be approximate).     Assessment:   This  is a routine wellness examination for [place].  Hearing/Vision screen Hearing Screening - Comments:: Denies hearing difficulties   Vision Screening - Comments:: Wears rx glasses - up to date with routine eye exams with  Dr Ronney Asters   Goals Addressed               This Visit's Progress     Patient Stated (pt-stated)        Lose weight.       Depression Screen    03/16/2023    8:41 AM 01/13/2023   10:03 AM 05/14/2021   10:20 AM 08/13/2020   10:31 AM 05/19/2018    9:51 AM 03/25/2017   11:54 AM 03/25/2017   10:39 AM  PHQ 2/9 Scores  PHQ - 2 Score 0 0 0 0 2 1 0  PHQ- 9 Score  3 4  10 3      Fall Risk    03/16/2023    8:42 AM 01/13/2023   10:02 AM 08/13/2020   10:34 AM 03/25/2017   10:38 AM  Fall Risk   Falls in the past year? 1 0 1 No  Number falls in past yr: 0 0 1   Injury with Fall? 1 0 0   Comment Rt ankle injury. Followed by medical attention     Risk for fall due to : No Fall Risks No Fall Risks History of fall(s);Impaired vision;Impaired balance/gait   Follow up Falls prevention discussed Falls evaluation completed Falls prevention discussed     MEDICARE RISK AT HOME: Medicare Risk at Home Any stairs in or around the home?: Yes If so, are there any without handrails?: No Home free of loose throw rugs in walkways, pet beds, electrical cords, etc?: Yes Adequate lighting in your home to reduce risk of falls?: Yes Life alert?: No Use of a cane, walker or w/c?: Yes Grab bars in  the bathroom?: Yes Shower chair or bench in shower?: No Elevated toilet seat or a handicapped toilet?: No  TIMED UP AND GO:  Was the test performed?  No    Cognitive Function:        03/16/2023    8:44 AM 08/13/2020   10:38 AM  6CIT Screen  What Year? 0 points 0 points  What month? 0 points 0 points  What time? 0 points   Count back from 20 0 points 0 points  Months in reverse 0 points 0 points  Repeat phrase 0 points 0 points  Total Score 0 points      Immunizations Immunization History  Administered Date(s) Administered   Fluad Quad(high Dose 65+) 03/16/2019, 05/14/2021, 03/27/2022   Influenza Split 06/12/2011   Influenza, High Dose Seasonal PF 03/17/2016, 03/25/2017, 03/22/2018, 03/21/2019, 03/21/2019, 04/21/2020   Influenza, Seasonal, Injecte, Preservative Fre 04/19/2015   PFIZER Comirnaty(Gray Top)Covid-19 Tri-Sucrose Vaccine 10/26/2020, 03/27/2022   PFIZER(Purple Top)SARS-COV-2 Vaccination 09/15/2019, 10/09/2019, 04/21/2020   Pneumococcal Conjugate-13 06/18/2016   Pneumococcal Polysaccharide-23 11/09/2012, 03/22/2018   Td 03/16/2001   Tdap 08/26/2011, 10/20/2018    TDAP status: Up to date  Flu Vaccine status: Due, Education has been provided regarding the importance of this vaccine. Advised may receive this vaccine at local pharmacy or Health Dept. Aware to provide a copy of the vaccination record if obtained from local pharmacy or Health Dept. Verbalized acceptance and understanding.  Pneumococcal vaccine status: Up to date  Covid-19 vaccine status: Declined, Education has been provided regarding the importance of this vaccine but patient still declined. Advised may receive this vaccine at local pharmacy or Health Dept.or vaccine clinic. Aware to provide a copy of the vaccination record if obtained from local pharmacy or Health Dept. Verbalized acceptance and understanding.  Qualifies for Shingles Vaccine? Yes   Zostavax completed No   Shingrix Completed?: No.    Education has been provided regarding the importance of this vaccine. Patient has been advised to call insurance company to determine out of pocket expense if they have not yet received this vaccine. Advised may also receive vaccine at local pharmacy or Health Dept. Verbalized acceptance and understanding.  Screening Tests Health Maintenance  Topic Date Due   Zoster Vaccines- Shingrix (1 of 2) Never done   INFLUENZA VACCINE  02/03/2023   COVID-19 Vaccine (6 -  2023-24 season) 03/06/2023   Medicare Annual Wellness (AWV)  03/15/2024   DTaP/Tdap/Td (4 - Td or Tdap) 10/19/2028   Pneumonia Vaccine 53+ Years old  Completed   DEXA SCAN  Completed   Hepatitis C Screening  Completed   HPV VACCINES  Aged Out   Colonoscopy  Discontinued    Health Maintenance  Health Maintenance Due  Topic Date Due   Zoster Vaccines- Shingrix (1 of 2) Never done   INFLUENZA VACCINE  02/03/2023   COVID-19 Vaccine (6 - 2023-24 season) 03/06/2023        Bone Density status: Completed 01/13/23. Results reflect: Bone density results: OSTEOPOROSIS. Repeat every   years.  Lung Cancer Screening: (Low Dose CT Chest recommended if Age 26-80 years, 20 pack-year currently smoking OR have quit w/in 15years.) does not qualify.     Additional Screening:  Hepatitis C Screening: does qualify; Completed 07/06/15  Vision Screening: Recommended annual ophthalmology exams for early detection of glaucoma and other disorders of the eye. Is the patient up to date with their annual eye exam?  Yes  Who is the provider or what is the name of the office  in which the patient attends annual eye exams? Dr Ronney Asters If pt is not established with a provider, would they like to be referred to a provider to establish care? No .   Dental Screening: Recommended annual dental exams for proper oral hygiene   Community Resource Referral / Chronic Care Management:  CRR required this visit?  No   CCM required this visit?  No     Plan:     I have personally reviewed and noted the following in the patient's chart:   Medical and social history Use of alcohol, tobacco or illicit drugs  Current medications and supplements including opioid prescriptions. Patient is not currently taking opioid prescriptions. Functional ability and status Nutritional status Physical activity Advanced directives List of other physicians Hospitalizations, surgeries, and ER visits in previous 12  months Vitals Screenings to include cognitive, depression, and falls Referrals and appointments  In addition, I have reviewed and discussed with patient certain preventive protocols, quality metrics, and best practice recommendations. A written personalized care plan for preventive services as well as general preventive health recommendations were provided to patient.     Tillie Rung, LPN   [id number]/36/[id number]   After Visit Summary: (MyChart) Due to this being a telephonic visit, the after visit summary with patients personalized plan was offered to patient via MyChart   Nurse Notes: None

## 2023-03-27 ENCOUNTER — Encounter: Payer: Self-pay | Admitting: Adult Health

## 2023-03-27 DIAGNOSIS — I4891 Unspecified atrial fibrillation: Secondary | ICD-10-CM

## 2023-03-27 DIAGNOSIS — F339 Major depressive disorder, recurrent, unspecified: Secondary | ICD-10-CM

## 2023-03-30 ENCOUNTER — Encounter: Payer: Self-pay | Admitting: Adult Health

## 2023-04-01 MED ORDER — APIXABAN 5 MG PO TABS
5.0000 mg | ORAL_TABLET | Freq: Two times a day (BID) | ORAL | 1 refills | Status: DC
Start: 2023-04-01 — End: 2023-10-04

## 2023-04-01 MED ORDER — TRETINOIN 0.05 % EX CREA: TOPICAL_CREAM | Freq: Every day | CUTANEOUS | 0 refills | Status: DC

## 2023-04-01 MED ORDER — CARVEDILOL 6.25 MG PO TABS
6.2500 mg | ORAL_TABLET | Freq: Two times a day (BID) | ORAL | 1 refills | Status: DC
Start: 2023-04-01 — End: 2023-06-17

## 2023-04-01 MED ORDER — FLUOXETINE HCL 40 MG PO CAPS
40.0000 mg | ORAL_CAPSULE | Freq: Every day | ORAL | 0 refills | Status: DC
Start: 2023-04-01 — End: 2023-06-28

## 2023-04-12 ENCOUNTER — Other Ambulatory Visit (HOSPITAL_COMMUNITY): Payer: Self-pay

## 2023-04-20 ENCOUNTER — Other Ambulatory Visit (HOSPITAL_COMMUNITY): Payer: Self-pay

## 2023-04-20 ENCOUNTER — Telehealth: Payer: Self-pay | Admitting: Pharmacy Technician

## 2023-04-20 NOTE — Telephone Encounter (Signed)
Pharmacy Patient Advocate Encounter   Received notification from CoverMyMeds that prior authorization for Tretinoin 0.05% cream is required/requested.   Insurance verification completed.   The patient is insured through [facility] .   Per test claim: PA required; PA submitted to [facility] via CoverMyMeds Key/confirmation #/EOC [id number] Status is pending

## 2023-04-25 NOTE — Telephone Encounter (Signed)
Pharmacy Patient Advocate Encounter  Received notification from [employer] that Prior Authorization for Tretinoin 0.05% cream has been DENIED.  Full denial letter will be uploaded to the media tab. See denial reason below.  Your plan does not allow coverage of this medication based on your prescriber answering No to the following question(s):  Does the patient have the diagnosis of acne vulgaris?   PA #/Case ID/Reference #: [id number]   Please be advised we currently do not have a Pharmacist to review denials, therefore you will need to process appeals accordingly as needed. Thanks for your support at this time. Contact for appeals are as follows: Phone: [phone], Fax: [phone]

## 2023-04-27 NOTE — Telephone Encounter (Signed)
Tried to call pt to advise of update but she was at work.

## 2023-04-29 NOTE — Telephone Encounter (Signed)
Left message to return phone call.

## 2023-04-29 NOTE — Telephone Encounter (Signed)
Patient notified of update  and verbalized understanding. Pt already knew this. Pt stated that never pay for this.

## 2023-06-16 ENCOUNTER — Other Ambulatory Visit: Payer: Self-pay | Admitting: Adult Health

## 2023-06-16 DIAGNOSIS — I4891 Unspecified atrial fibrillation: Secondary | ICD-10-CM

## 2023-06-28 ENCOUNTER — Other Ambulatory Visit: Payer: Self-pay | Admitting: Adult Health

## 2023-06-28 DIAGNOSIS — F339 Major depressive disorder, recurrent, unspecified: Secondary | ICD-10-CM

## 2023-08-03 ENCOUNTER — Ambulatory Visit (INDEPENDENT_AMBULATORY_CARE_PROVIDER_SITE_OTHER): Payer: PPO | Admitting: Adult Health

## 2023-08-03 ENCOUNTER — Encounter: Payer: Self-pay | Admitting: Adult Health

## 2023-08-03 VITALS — BP 100/80 | HR 73 | Temp 97.7°F | Ht 65.0 in | Wt 184.0 lb

## 2023-08-03 DIAGNOSIS — F32A Depression, unspecified: Secondary | ICD-10-CM

## 2023-08-03 DIAGNOSIS — F419 Anxiety disorder, unspecified: Secondary | ICD-10-CM | POA: Diagnosis not present

## 2023-08-03 DIAGNOSIS — I4891 Unspecified atrial fibrillation: Secondary | ICD-10-CM | POA: Diagnosis not present

## 2023-08-03 MED ORDER — CLONAZEPAM 0.5 MG PO TABS: 0.5000 mg | ORAL_TABLET | Freq: Three times a day (TID) | ORAL | 1 refills | Status: AC | PRN

## 2023-08-03 NOTE — Progress Notes (Signed)
Subjective:    Patient ID: Colleen Reynolds, female    DOB: 04/22/1946, 78 y.o.   MRN: [id number]  HPI 78 year old female who  has a past medical history of Anemia, Atrial fibrillation (HCC), Depression, Diverticulosis, GERD (gastroesophageal reflux disease), History of kidney stones, Hypertension, Kidney stones (2008), OA (osteoarthritis) of hip, osteoporosis, Shingles, and Vitamin D deficiency.  She presents to the office today for Anxiety and Depression. She is currently maintained on Prozac 40 mg daily and klonopin 0.5 2-3 times a day PRN. She reports that she ran out of klonopin and needs a refill. Feels as though her depression is worse currently due to it being so dark outside, this has been the case for her in the past.    Review of Systems See HPI   Past Medical History:  Diagnosis Date   Anemia    Atrial fibrillation (HCC)    Depression    Diverticulosis    GERD (gastroesophageal reflux disease)    History of kidney stones    Hypertension    Kidney stones 2008   OA (osteoarthritis) of hip    osteoporosis    Shingles    Vitamin D deficiency     Social History   Socioeconomic History   Marital status: Married    Spouse name: Not on file   Number of children: Not on file   Years of education: Not on file   Highest education level: Not on file  Occupational History   Not on file  Tobacco Use   Smoking status: Never   Smokeless tobacco: Never  Vaping Use   Vaping status: Never Used  Substance and Sexual Activity   Alcohol use: Yes    Alcohol/week: 1.0 standard drink of alcohol    Types: 1 Standard drinks or equivalent per week    Comment: socially   Drug use: Not Currently    Types: Marijuana    Comment: LSD stopped 1969   Sexual activity: Not on file  Other Topics Concern   Not on file  Social History Narrative   Not on file   Social Drivers of Health   Financial Resource Strain: Low Risk  (03/16/2023)   Overall Financial Resource Strain (CARDIA)     Difficulty of Paying Living Expenses: Not hard at all  Food Insecurity: No Food Insecurity (03/16/2023)   Hunger Vital Sign    Worried About Running Out of Food in the Last Year: Never true    Ran Out of Food in the Last Year: Never true  Transportation Needs: No Transportation Needs (03/16/2023)   PRAPARE - Administrator, Civil Service (Medical): No    Lack of Transportation (Non-Medical): No  Physical Activity: Inactive (03/16/2023)   Exercise Vital Sign    Days of Exercise per Week: 0 days    Minutes of Exercise per Session: 0 min  Stress: No Stress Concern Present (03/16/2023)   [employer] of Occupational Health - Occupational Stress Questionnaire    Feeling of Stress : Not at all  Social Connections: Moderately Isolated (03/16/2023)   Social Connection and Isolation Panel [NHANES]    Frequency of Communication with Friends and Family: More than three times a week    Frequency of Social Gatherings with Friends and Family: More than three times a week    Attends Religious Services: Never    Database administrator or Organizations: No    Attends Banker Meetings: Never    Marital Status:  Married  Intimate Partner Violence: Not At Risk (03/16/2023)   Humiliation, Afraid, Rape, and Kick questionnaire    Fear of Current or Ex-Partner: No    Emotionally Abused: No    Physically Abused: No    Sexually Abused: No    Past Surgical History:  Procedure Laterality Date   ABDOMINAL HYSTERECTOMY     brachialrsadius puritis     BREAST REDUCTION SURGERY  1985   COLONOSCOPY  2005   CYSTOSCOPY W/ URETERAL STENT PLACEMENT Right 07/11/2021   Procedure: CYSTOSCOPY WITH RETROGRADE PYELOGRAM/URETERAL STENT PLACEMENT;  Surgeon: Crist Fat, MD;  Location[facility];  Service: Urology;  Laterality: Right;   CYSTOSCOPY/URETEROSCOPY/HOLMIUM LASER/STENT PLACEMENT Right 07/27/2021   Procedure: CYSTOSCOPY, RETROGRADE PYELOGRAM, URETEROSCOPY, HOLMIUM LASER, STENT  PLACEMENT;  Surgeon: Jannifer Hick, MD;  Location: [facility];  Service: Urology;  Laterality: Right;  ONLY NEEDS 60 MIN   FRACTURE SURGERY Left 2020   dislocated left elbow    IR NEPHROSTOMY PLACEMENT RIGHT  07/10/2021   KNEE ARTHROSCOPY WITH EXCISION BAKER'S CYST     KNEE SURGERY     1967   LITHOTRIPSY  2008    Family History  Problem Relation Age of Onset   Stroke Mother    Stroke Father    Cancer Sister    Depression Daughter    Migraines Daughter    Colon cancer Neg Hx    Esophageal cancer Neg Hx    Rectal cancer Neg Hx    Stomach cancer Neg Hx     Allergies  Allergen Reactions   Clindamycin/Lincomycin Nausea And Vomiting   Lovastatin Other (See Comments)    Fatigue, muscle aches   Penicillins Rash    Tolerated Rocephin 07/10/21    Current Outpatient Medications on File Prior to Visit  Medication Sig Dispense Refill   acetaminophen (TYLENOL) 500 MG tablet Take 1,000 mg by mouth every 8 (eight) hours as needed for moderate pain.     apixaban (ELIQUIS) 5 MG TABS tablet Take 1 tablet (5 mg total) by mouth 2 (two) times daily. 180 tablet 1   carvedilol (COREG) 6.25 MG tablet TAKE 1 TABLET BY MOUTH 2 TIMES DAILY WITH A MEAL. 180 tablet 1   clonazePAM (KLONOPIN) 0.5 MG tablet Take 0.5 mg by mouth daily. 1/2 tablet up to 4 times daily     diphenhydrAMINE (BENADRYL) 25 mg capsule Take 12 mg by mouth at bedtime. For insomnia     famotidine (PEPCID) 20 MG tablet Take 20 mg by mouth daily.      fexofenadine (ALLEGRA) 180 MG tablet Take 180 mg by mouth as needed for allergies or rhinitis.     FLUoxetine (PROZAC) 40 MG capsule TAKE 1 CAPSULE (40 MG TOTAL) BY MOUTH DAILY. 90 capsule 1   Red Yeast Rice 600 MG CAPS Take 600 mg by mouth in the morning and at bedtime.     tretinoin (RETIN-A) 0.05 % cream Apply topically at bedtime. 45 g 0   No current facility-administered medications on file prior to visit.    BP 100/80   Pulse 73   Temp 97.7 F (36.5 C) (Oral)   Ht 5\' 5"  (1.651 m)    Wt 184 lb (83.5 kg)   SpO2 96%   BMI 30.62 kg/m       Objective:   Physical Exam Vitals and nursing note reviewed.  Constitutional:      Appearance: Normal appearance.  Cardiovascular:     Rate and Rhythm: Rhythm irregularly irregular.  Musculoskeletal:  General: Normal range of motion.  Skin:    General: Skin is warm and dry.  Neurological:     General: No focal deficit present.     Mental Status: She is alert and oriented to person, place, and time.  Psychiatric:        Mood and Affect: Mood normal.        Behavior: Behavior normal.        Thought Content: Thought content normal.        Judgment: Judgment normal.       Assessment & Plan:  1. Anxiety and depression (Primary) Flowsheet Row Office Visit from 08/03/2023 in [facility] [place]  PHQ-9 Total Score 12         08/03/2023    7:51 AM 05/14/2021   10:20 AM 05/19/2018    9:52 AM  GAD 7 : Generalized Anxiety Score  Nervous, Anxious, on Edge 1 0 2  Control/stop worrying 3 1 2   Worry too much - different things 0 0 1  Trouble relaxing 0 0 0  Restless 0 0 0  Easily annoyed or irritable 0 0 0  Afraid - awful might happen 0 1 3  Total GAD 7 Score 4 2 8   Anxiety Difficulty Somewhat difficult Not difficult at all    - She did not want to change any of her medications today.  - Continue with Prozac  - clonazePAM (KLONOPIN) 0.5 MG tablet; Take 1 tablet (0.5 mg total) by mouth 3 (three) times daily as needed for anxiety.  Dispense: 90 tablet; Refill: 1  2. Atrial fibrillation, unspecified type (HCC) - Continue beta blocker and anticoagulation.  - Follow up with Cardiology as directed   Shirline Frees, NP

## 2023-08-03 NOTE — Patient Instructions (Signed)
It was great seeing you today   You can check with your insurance to see if Wegovy or Zepbound is covered.

## 2023-08-04 ENCOUNTER — Other Ambulatory Visit: Payer: Self-pay | Admitting: Adult Health

## 2023-08-04 ENCOUNTER — Encounter: Payer: Self-pay | Admitting: Adult Health

## 2023-08-04 DIAGNOSIS — E66811 Obesity, class 1: Secondary | ICD-10-CM

## 2023-08-04 MED ORDER — WEGOVY 0.25 MG/0.5ML ~~LOC~~ SOAJ: 0.2500 mg | SUBCUTANEOUS | 0 refills | Status: DC

## 2023-08-05 NOTE — Telephone Encounter (Signed)
Looks like this needs a PA. Will send to PA team for assistance.

## 2023-08-17 ENCOUNTER — Telehealth: Payer: Self-pay | Admitting: Pharmacy Technician

## 2023-08-17 ENCOUNTER — Other Ambulatory Visit (HOSPITAL_COMMUNITY): Payer: Self-pay

## 2023-08-17 NOTE — Telephone Encounter (Signed)
Pharmacy Patient Advocate Encounter   Received notification from RX Request Messages that prior authorization for [facility] 0.25MG /0.5ML auto-injectors is required/requested.   Insurance verification completed.   The patient is insured through [facility] ADVANTAGE/RX ADVANCE .   Per test claim: PA required; PA submitted to above mentioned insurance via CoverMyMeds Key/confirmation #/EOC [id number] Status is pending

## 2023-08-17 NOTE — Telephone Encounter (Signed)
PA request has been Submitted. New Encounter created for follow up. For additional info see Pharmacy Prior Auth telephone encounter from 08/17/2023.

## 2023-08-18 NOTE — Telephone Encounter (Signed)
Pharmacy Patient Advocate Encounter  Received notification from [facility] [employer] that Prior Authorization for [facility] 0.25MG /0.5ML auto-injectors has been DENIED.  Full denial letter will be uploaded to the media tab. See denial reason below.      PA #/Case ID/Reference #: [id number]

## 2023-08-18 NOTE — Telephone Encounter (Signed)
Patient notified of update  and verbalized understanding.

## 2023-09-22 ENCOUNTER — Encounter: Payer: Self-pay | Admitting: Cardiovascular Disease

## 2023-09-22 ENCOUNTER — Ambulatory Visit: Attending: Cardiovascular Disease | Admitting: Cardiovascular Disease

## 2023-09-22 VITALS — BP 126/72 | HR 99 | Ht 66.0 in | Wt 180.3 lb

## 2023-09-22 DIAGNOSIS — I480 Paroxysmal atrial fibrillation: Secondary | ICD-10-CM | POA: Diagnosis not present

## 2023-09-22 DIAGNOSIS — D6869 Other thrombophilia: Secondary | ICD-10-CM

## 2023-09-22 DIAGNOSIS — I10 Essential (primary) hypertension: Secondary | ICD-10-CM

## 2023-09-22 DIAGNOSIS — E7800 Pure hypercholesterolemia, unspecified: Secondary | ICD-10-CM | POA: Diagnosis not present

## 2023-09-22 MED ORDER — CARVEDILOL 12.5 MG PO TABS: 12.5000 mg | ORAL_TABLET | Freq: Two times a day (BID) | ORAL | 3 refills | Status: DC

## 2023-09-22 NOTE — Progress Notes (Signed)
 Cardiology Office Note:    Date:  09/22/2023   ID:  Colleen Reynolds, DOB 29-Feb-1946, MRN [id number]  PCP:  Shirline Frees, NP   [facility] Providers Cardiologist:  Thurmon Fair, MD     Referring MD: Shirline Frees, NP   Chief Complaint  Patient presents with   Atrial Fibrillation     History of Present Illness:    Colleen Reynolds is a 78 y.o. female with a hx of hypertension and hypercholesterolemia as well as history of recurrent paroxysmal atrial fibrillation since age 31.  She began wearing a smart watch in 2022 and has noticed a steady increase in the frequency of the episodes of atrial fibrillation.    The arrhythmia prevalence clearly increases around the time that she may have an acute illness (such as pyelonephritis), but independently of this has been a steady increase in the burden of atrial fibrillation.  Her smart watch has generally reported roughly 20% burden of atrial fibrillation last year, but recently this has increased to about 40% and some weeks will actually be 60%.  He is not aware of palpitations, but does notice a significant decrease in her energy level.  She has not had chest pain or frank dyspnea.  She has not experienced syncope.  During the episodes of atrial fibrillation rate control is adequate with ventricular rates around 100 bpm.  She is currently taking carvedilol 6.25 mg twice daily as her only cardiac medication.  She is compliant with anticoagulation with Eliquis and has not had any serious bleeding complications and denies any falls.  She presents in atrial fibrillation today with a ventricular rate at 99 bpm.  This is readily recognized by her smart watch and confirmed on her ECG today.  I showed her how to transmit a recording from her smart watch to the electronic medical record.  Otherwise she has no cardiovascular complaints.  She denies chest pain or shortness of breath with activity when she is in normal rhythm.  She does not have  orthopnea, PND, lower extremity edema and has not experienced dizziness or syncope.  She does not snore loudly and does not have daytime hypersomnolence.  She has never had focal neurological complaints that would suggest stroke or TIA.    In 2023 when she had gram-negative bacteremia due to pyelonephritis and atrial fibrillation her echocardiogram showed normal left ventricular systolic function.  The report suggests "normal left atrial size" but in fact based on the end-systolic diameter and end-systolic volume index she has mild left atrial dilation.  Otherwise there were no structural abnormalities on the echocardiogram.  Past Medical History:  Diagnosis Date   Anemia    Atrial fibrillation (HCC)    Depression    Diverticulosis    GERD (gastroesophageal reflux disease)    History of kidney stones    Hypertension    Kidney stones 2008   OA (osteoarthritis) of hip    osteoporosis    Shingles    Vitamin D deficiency     Past Surgical History:  Procedure Laterality Date   ABDOMINAL HYSTERECTOMY     brachialrsadius puritis     BREAST REDUCTION SURGERY  1985   COLONOSCOPY  2005   CYSTOSCOPY W/ URETERAL STENT PLACEMENT Right 07/11/2021   Procedure: CYSTOSCOPY WITH RETROGRADE PYELOGRAM/URETERAL STENT PLACEMENT;  Surgeon: Crist Fat, MD;  Location: [facility];  Service: Urology;  Laterality: Right;   CYSTOSCOPY/URETEROSCOPY/HOLMIUM LASER/STENT PLACEMENT Right 07/27/2021   Procedure: CYSTOSCOPY, RETROGRADE PYELOGRAM, URETEROSCOPY, HOLMIUM LASER, STENT PLACEMENT;  Surgeon: Jannifer Hick, MD;  Location: [facility];  Service: Urology;  Laterality: Right;  ONLY NEEDS 60 MIN   FRACTURE SURGERY Left 2020   dislocated left elbow    IR NEPHROSTOMY PLACEMENT RIGHT  07/10/2021   KNEE ARTHROSCOPY WITH EXCISION BAKER'S CYST     KNEE SURGERY     1967   LITHOTRIPSY  2008    Current Medications: Current Meds  Medication Sig   clonazePAM (KLONOPIN) 0.5 MG tablet Take 1 tablet (0.5 mg total) by  mouth 3 (three) times daily as needed for anxiety.   famotidine (PEPCID) 20 MG tablet Take 20 mg by mouth daily.    FLUoxetine (PROZAC) 40 MG capsule TAKE 1 CAPSULE (40 MG TOTAL) BY MOUTH DAILY.   Red Yeast Rice 600 MG CAPS Take 600 mg by mouth in the morning and at bedtime.   [DISCONTINUED] carvedilol (COREG) 6.25 MG tablet TAKE 1 TABLET BY MOUTH 2 TIMES DAILY WITH A MEAL.     Allergies:   Clindamycin/lincomycin, Lovastatin, and Penicillins   Family History: The patient's family history includes Cancer in her sister; Depression in her daughter; Migraines in her daughter; Stroke in her father and mother. There is no history of Colon cancer, Esophageal cancer, Rectal cancer, or Stomach cancer.  ROS:   Please see the history of present illness.     All other systems reviewed and are negative.  EKGs/Labs/Other Studies Reviewed:    The following studies were reviewed today: ECHO 07/11/2021   1. Left ventricular ejection fraction, by estimation, is 55 to 60%. The left ventricle has normal function. The left ventricle has no regional wall motion abnormalities. There is mild left ventricular hypertrophy.  Left ventricular diastolic parameters  were normal.   2. Right ventricular systolic function is normal. The right ventricular size is normal.   3. The mitral valve is normal in structure. Mild mitral valve  regurgitation.   4. The aortic valve is tricuspid. Aortic valve regurgitation is not visualized. Aortic valve sclerosis is present, with no evidence of aortic valve stenosis.   5. The inferior vena cava is dilated in size with <50% respiratory variability, suggesting right atrial pressure of 15 mmHg.   EKG:    EKG Interpretation Date/Time:  Thursday September 22 2023 14:23:45 EDT Ventricular Rate:  99 PR Interval:    QRS Duration:  82 QT Interval:  364 QTC Calculation: 467 R Axis:   -40  Text Interpretation: Atrial fibrillation with premature ventricular or aberrantly conducted  complexes Left axis deviation Minimal voltage criteria for LVH, may be normal variant ( R in aVL ) Septal infarct , age undetermined When compared with ECG of 09-Jul-2021 11:20,  rate is slower Confirmed by Harla Mensch [phone]) on 09/22/2023 2:32:50 PM         Recent Labs: 12/16/2022: BUN 23; Creatinine, Ser 1.07; Hemoglobin 14.7; Platelets 208; Potassium 4.7; Sodium 137  Recent Lipid Panel    Component Value Date/Time   CHOL 242 (H) 03/23/2018 1218   TRIG 96.0 03/23/2018 1218   HDL 63.80 03/23/2018 1218   CHOLHDL 4 03/23/2018 1218   VLDL 19.2 03/23/2018 1218   LDLCALC 159 (H) 03/23/2018 1218     Risk Assessment/Calculations:    CHA2DS2-VASc Score = 4   This indicates a 4.8% annual risk of stroke. The patient's score is based upon: CHF History: 0 HTN History: 1 Diabetes History: 0 Stroke History: 0 Vascular Disease History: 0 Age Score: 2 Gender Score: 1      Has  bled score is 1  Physical Exam:    VS:  BP 126/72 (Cuff Size: Normal)   Pulse 99   Ht 5\' 6"  (1.676 m)   Wt 180 lb 4.8 oz (81.8 kg)   BMI 29.10 kg/m     Wt Readings from Last 3 Encounters:  09/22/23 180 lb 4.8 oz (81.8 kg)  08/03/23 184 lb (83.5 kg)  03/16/23 174 lb (78.9 kg)      General: Alert, oriented x3, no distress, overweight.  Appears younger than stated age. Head: no evidence of trauma, PERRL, EOMI, no exophtalmos or lid lag, no myxedema, no xanthelasma; normal ears, nose and oropharynx Neck: normal jugular venous pulsations and no hepatojugular reflux; brisk carotid pulses without delay and no carotid bruits Chest: clear to auscultation, no signs of consolidation by percussion or palpation, normal fremitus, symmetrical and full respiratory excursions Cardiovascular: normal position and quality of the apical impulse, irregular rhythm, normal first and second heart sounds, no murmurs, rubs or gallops Abdomen: no tenderness or distention, no masses by palpation, no abnormal pulsatility or  arterial bruits, normal bowel sounds, no hepatosplenomegaly Extremities: no clubbing, cyanosis or edema; 2+ radial, ulnar and brachial pulses bilaterally; 2+ right femoral, posterior tibial and dorsalis pedis pulses; 2+ left femoral, posterior tibial and dorsalis pedis pulses; no subclavian or femoral bruits Neurological: grossly nonfocal Psych: Normal mood and affect   ASSESSMENT:    1. Paroxysmal atrial fibrillation (HCC)   2. Essential hypertension   3. Acquired thrombophilia (HCC)   4. Hypercholesterolemia    PLAN:    In order of problems listed above:  Paroxysmal atrial fibrillation: Over the years, the burden of atrial fibrillation has increased and she has become more symptomatic with substantial fatigue when in atrial fibrillation.  She does not have significant structural heart disease or obstructive sleep apnea and is on the moderately overweight.  We discussed options for further management and I think she would be best suited for an early ablation strategy, rather than antiarrhythmic medications.   CHA2DS2-VASc score 4, compliant with anticoagulation.  Rate control was borderline.  Increase the carvedilol to 12.5 mg twice daily. HTN: Blood pressure is very well-controlled.  There is borderline LVH on echocardiography and a mildly dilated left atrium.  I do not think this is the underlying cause for atrial fibrillation, which appears to have started at a very young age.   Anticoagulation: No bleeding complications. HLP: I do not have a recent lipid profile.  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    Do not have a recent lipid profile available for review.  LDL was 159 on labs performed in 2019, 129 on labs performed in 2017 and 2018.  I have suggested a coronary calcium score to fine-tune the lipid-lowering prescription but this has not yet been performed.  Note that on a CT of the abdomen for kidney stones performed in 2023 there was no  visible aortic atherosclerosis.           Medication Adjustments/Labs and Tests Ordered: Current medicines are reviewed at length with the patient today.  Concerns regarding medicines are outlined above.  Orders Placed This Encounter  Procedures   Ambulatory referral to Cardiac Electrophysiology   EKG 12-Lead   Meds ordered this encounter  Medications   carvedilol (COREG) 12.5 MG tablet    Sig: Take 1 tablet (12.5 mg total) by mouth 2 (two) times daily with a meal.    Dispense:  180 tablet  Refill:  3    Dx.code I48.91    Patient Instructions  Medication Instructions:  Your physician has recommended you make the following change in your medication:   Increase Carvedilol 12.5mg  twice daily  ( you may take two of your 6.25mg  twice daily to use them up)  *If you need a refill on your cardiac medications before your next appointment, please call your pharmacy*  Follow-Up: At [facility], you and your health needs are our priority.  As part of our continuing mission to provide you with exceptional heart care, we have created designated Provider Care Teams.  These Care Teams include your primary Cardiologist (physician) and Advanced Practice Providers (APPs -  Physician Assistants and Nurse Practitioners) who all work together to provide you with the care you need, when you need it.  We recommend signing up for the patient portal called "MyChart".  Sign up information is provided on this After Visit Summary.  MyChart is used to connect with patients for Virtual Visits (Telemedicine).  Patients are able to view lab/test results, encounter notes, upcoming appointments, etc.  Non-urgent messages can be sent to your provider as well.   To learn more about what you can do with MyChart, go to [email or web address].    Your next appointment:   1 year(s)  Provider:   Thurmon Fair, MD     Other Instructions We have referred you to talk about A. Fib Ablation     1st  Floor: - Lobby - Registration  - Pharmacy  - Lab - Cafe  2nd Floor: - PV Lab - Diagnostic Testing (echo, CT, nuclear med)  3rd Floor: - Vacant  4th Floor: - TCTS (cardiothoracic surgery) - AFib Clinic - Structural Heart Clinic - Vascular Surgery  - Vascular Ultrasound  5th Floor: - HeartCare Cardiology (general and EP) - Clinical Pharmacy for coumadin, hypertension, lipid, weight-loss medications, and med management appointments    Valet parking services will be available as well.         Signed, Thurmon Fair, MD  09/22/2023 8:59 PM    [facility]

## 2023-09-22 NOTE — Telephone Encounter (Signed)
 Thanks [employer]. We were demo-ing how to send an ECG from smart watch to [employer]

## 2023-09-22 NOTE — Patient Instructions (Signed)
 Medication Instructions:  Your physician has recommended you make the following change in your medication:   Increase Carvedilol 12.5mg  twice daily  ( you may take two of your 6.25mg  twice daily to use them up)  *If you need a refill on your cardiac medications before your next appointment, please call your pharmacy*  Follow-Up: At [facility], you and your health needs are our priority.  As part of our continuing mission to provide you with exceptional heart care, we have created designated Provider Care Teams.  These Care Teams include your primary Cardiologist (physician) and Advanced Practice Providers (APPs -  Physician Assistants and Nurse Practitioners) who all work together to provide you with the care you need, when you need it.  We recommend signing up for the patient portal called "MyChart".  Sign up information is provided on this After Visit Summary.  MyChart is used to connect with patients for Virtual Visits (Telemedicine).  Patients are able to view lab/test results, encounter notes, upcoming appointments, etc.  Non-urgent messages can be sent to your provider as well.   To learn more about what you can do with MyChart, go to [email or web address].    Your next appointment:   1 year(s)  Provider:   Thurmon Fair, MD     Other Instructions We have referred you to talk about A. Fib Ablation     1st Floor: - Lobby - Registration  - Pharmacy  - Lab - Cafe  2nd Floor: - PV Lab - Diagnostic Testing (echo, CT, nuclear med)  3rd Floor: - Vacant  4th Floor: - TCTS (cardiothoracic surgery) - AFib Clinic - Structural Heart Clinic - Vascular Surgery  - Vascular Ultrasound  5th Floor: - HeartCare Cardiology (general and EP) - Clinical Pharmacy for coumadin, hypertension, lipid, weight-loss medications, and med management appointments    Valet parking services will be available as well.

## 2023-10-01 ENCOUNTER — Other Ambulatory Visit: Payer: Self-pay | Admitting: Adult Health

## 2023-10-01 DIAGNOSIS — I4891 Unspecified atrial fibrillation: Secondary | ICD-10-CM

## 2023-10-04 NOTE — Telephone Encounter (Signed)
 Okay for refill?

## 2023-10-17 DIAGNOSIS — B0233 Zoster keratitis: Secondary | ICD-10-CM | POA: Diagnosis not present

## 2023-10-24 ENCOUNTER — Encounter: Payer: Self-pay | Admitting: Cardiovascular Disease

## 2023-10-24 ENCOUNTER — Ambulatory Visit: Attending: Cardiovascular Disease | Admitting: Cardiovascular Disease

## 2023-10-24 VITALS — BP 116/60 | HR 93 | Ht 66.0 in | Wt 180.0 lb

## 2023-10-24 DIAGNOSIS — I480 Paroxysmal atrial fibrillation: Secondary | ICD-10-CM

## 2023-10-24 NOTE — Patient Instructions (Signed)
 Medication Instructions:  Your physician recommends that you continue on your current medications as directed. Please refer to the Current Medication list given to you today. *If you need a refill on your cardiac medications before your next appointment, please call your pharmacy*  Lab Work: CBC and BMET - please have pre-procedure lab work completed on Monday, June 9 . This can be done at ANY [facility] near you - no appointment required and this does not have to be fasting. If you have labs (blood work) drawn today and your tests are completely normal, you will receive your results only by: MyChart Message (if you have MyChart) OR A paper copy in the mail If you have any lab test that is abnormal or we need to change your treatment, we will call you to review the results.  Testing/Procedures: Cardiac CT - someone will contact you to schedule this  Your physician has requested that you have cardiac CT. Cardiac computed tomography (CT) is a painless test that uses an x-ray machine to take clear, detailed pictures of your heart. For further information please visit [email or web address] Please follow instruction sheet as given.   Atrial Fibrillation Ablation - scheduled on Wednesday, July 9. We will be in contact closer to your ablation date with further instructions Your physician has recommended that you have an ablation. Catheter ablation is a medical procedure used to treat some cardiac arrhythmias (irregular heartbeats). During catheter ablation, a long, thin, flexible tube is put into a blood vessel in your groin (upper thigh), or neck. This tube is called an ablation catheter. It is then guided to your heart through the blood vessel. Radio frequency waves destroy small areas of heart tissue where abnormal heartbeats may cause an arrhythmia to start. Please see the instruction sheet given to you today.  Follow-Up: At [facility], you and your health needs are our priority.  As part of  our continuing mission to provide you with exceptional heart care, our providers are all part of one team.  This team includes your primary Cardiologist (physician) and Advanced Practice Providers or APPs (Physician Assistants and Nurse Practitioners) who all work together to provide you with the care you need, when you need it.  Your next appointment:   We will schedule follow up after your ablation  Provider:   Marlane Silver, MD   Cardiac Ablation Cardiac ablation is a procedure to destroy, or ablate, a small amount of heart tissue that is causing problems. The heart has many electrical connections. Sometimes, these connections are abnormal and can cause the heart to beat very fast or irregularly. Ablating the abnormal areas can improve the heart's rhythm or return it to normal. Ablation may be done for people who: Have irregular or rapid heartbeats (arrhythmias). Have Wolff-Parkinson-White syndrome. Have taken medicines for an arrhythmia that did not work or caused side effects. Have a high-risk heartbeat that may be life-threatening. Tell a health care provider about: Any allergies you have. All medicines you are taking, including vitamins, herbs, eye drops, creams, and over-the-counter medicines. Any problems you or family members have had with anesthesia. Any bleeding problems you have. Any surgeries you have had. Any medical conditions you have. Whether you are pregnant or may be pregnant. What are the risks? Your health care provider will talk with you about risks. These may include: Infection. Bruising and bleeding. Stroke or blood clots. Damage to nearby structures or organs. Allergic reaction to medicines or dyes. Needing a pacemaker if the heart gets damaged.  A pacemaker is a device that helps the heart beat normally. Failure of the procedure. A repeat procedure may be needed. What happens before the procedure? Medicines Ask your health care provider about: Changing  or stopping your regular medicines. These include any heart rhythm medicines, diabetes medicines, or blood thinners you take. Taking medicines such as aspirin and ibuprofen. These medicines can thin your blood. Do not take them unless your health care provider tells you to. Taking over-the-counter medicines, vitamins, herbs, and supplements. General instructions Follow instructions from your health care provider about what you may eat and drink. If you will be going home right after the procedure, plan to have a responsible adult: Take you home from the hospital or clinic. You will not be allowed to drive. Care for you for the time you are told. Ask your health care provider what steps will be taken to prevent infection. What happens during the procedure?  An IV will be inserted into one of your veins. You may be given: A sedative. This helps you relax. Anesthesia. This will: Numb certain areas of your body. An incision will be made in your neck or your groin. A needle will be inserted through the incision and into a large vein in your neck or groin. The small, thin tube (catheter) will be inserted through the needle and moved to your heart. A type of X-ray (fluoroscopy) will be used to help guide the catheter and provide images of the heart on a monitor. Dye may be injected through the catheter to help your surgeon see the area of the heart that needs treatment. Electrical currents will be sent from the catheter to destroy heart tissue in certain areas. There are three types of energy that may be used to do this: Heat (radiofrequency energy). Laser energy. Extreme cold (cryoablation). When the tissue has been destroyed, the catheter will be removed. Pressure will be held on the insertion area to prevent bleeding. A bandage (dressing) will be placed over the insertion area. The procedure may vary among health care providers and hospitals. What happens after the procedure? Your blood  pressure, heart rate and rhythm, breathing rate, and blood oxygen level will be monitored until you leave the hospital or clinic. Your insertion area will be checked for bleeding. You will need to lie still for a few hours. If your groin was used, you will need to keep your leg straight for a few hours after the catheter is removed. This information is not intended to replace advice given to you by your health care provider. Make sure you discuss any questions you have with your health care provider. Document Revised: 12/08/2021 Document Reviewed: 12/08/2021 Elsevier Patient Education  2024 [employer].

## 2023-10-24 NOTE — Progress Notes (Signed)
 Electrophysiology Office Note:    Date:  10/24/2023   ID:  Colleen Reynolds, DOB 1946/01/14, MRN [id number]  PCP:  Alto Atta, NP   [facility] Providers Cardiologist:  Luana Rumple, MD     Referring MD: Luana Rumple, MD   History of Present Illness:    Colleen Reynolds is a 78 y.o. female with a medical history significant for paroxysmal atrial fibrillation, hypertension, hyperlipidemia, referred for arrhythmia management.     Discussed the use of AI scribe software for clinical note transcription with the patient, who gave verbal consent to proceed.  History of Present Illness Colleen Reynolds is a 78 year old female with atrial fibrillation who presents for evaluation of her condition.  She was first diagnosed with atrial fibrillation approximately 40 years ago, as she recalls, by her PCP. The condition was not addressed again until November or December of 2016 when she became very ill, leading to a hospitalization where atrial fibrillation was identified as a significant issue. She was in the ICU for six days and remained in atrial fibrillation throughout her stay.  She has difficulty determining when she is in normal rhythm but uses her Apple Watch to monitor her heart rhythm. Recently, she noted two instances in the past week where her watch indicated a normal rhythm, with a 44% normal rhythm reading last week, which is an improvement from a previous 66% atrial fibrillation reading. She also performed an EKG yesterday that showed normal sinus rhythm.  She has paroxysmal atrial fibrillation and is currently on blood thinners. She has not undergone any ablation procedures yet.         Today, She reports that she feels fine. She does not have any palpitations or lightheadedness.  EKGs/Labs/Other Studies Reviewed Today:     Echocardiogram:  TTE January 2023 LVEF 55 to 60%.  Normal RV systolic function.     EKG:   EKG  Interpretation Date/Time:  Monday October 24 2023 10:17:16 EDT Ventricular Rate:  92 PR Interval:    QRS Duration:  90 QT Interval:  378 QTC Calculation: 467 R Axis:   -51  Text Interpretation: Atrial fibrillation Left anterior fascicular block Moderate voltage criteria for LVH, may be normal variant ( R in aVL , Cornell product ) When compared with ECG of 22-Sep-2023 14:23, No significant change was found Confirmed by Marlane Silver [phone]) on 10/24/2023 10:25:18 AM     Physical Exam:    VS:  BP 116/60 (BP Location: Left Arm, Patient Position: Sitting, Cuff Size: Large)   Pulse 93   Ht 5\' 6"  (1.676 m)   Wt 180 lb (81.6 kg)   SpO2 95%   BMI 29.05 kg/m     Wt Readings from Last 3 Encounters:  10/24/23 180 lb (81.6 kg)  09/22/23 180 lb 4.8 oz (81.8 kg)  08/03/23 184 lb (83.5 kg)     GEN: Well nourished, well developed in no acute distress CARDIAC: RRR, no murmurs, rubs, gallops RESPIRATORY:  Normal work of breathing MUSCULOSKELETAL: no edema    ASSESSMENT & PLAN:     Paroxysmal atrial fibrillation She is in AF today I reviewed tracing from her watch yesterday that showed sinus rhythm She does not have significant symptoms We discussed the rationale for maintenance of sinus rhythm including reduced risk of stroke and development of symptoms. Using a joint decision-making approach, we decided to proceed with ablation. The EF appears somewhat organized and some EKGs, so I think she will benefit from  a flutter line at the time of her ablation.  We discussed the indication, rationale, logistics, anticipated benefits, and potential risks of the ablation procedure including but not limited to -- bleed at the groin access site, chest pain, damage to nearby organs such as the diaphragm, lungs, or esophagus, need for a drainage tube, or prolonged hospitalization. I explained that the risk for stroke, heart attack, need for open chest surgery, or even death is very low but not zero.  she  expressed understanding and wishes to proceed.   Secondary hypercoagulable state CHA2DS2-VASc score is 4 Continue Eliquis  5 mg twice daily  Hypertension BP controlled today      Signed, Efraim Grange, MD  10/24/2023 10:44 AM    [facility]

## 2023-10-25 DIAGNOSIS — B0233 Zoster keratitis: Secondary | ICD-10-CM | POA: Diagnosis not present

## 2023-10-25 DIAGNOSIS — H17822 Peripheral opacity of cornea, left eye: Secondary | ICD-10-CM | POA: Diagnosis not present

## 2023-11-05 ENCOUNTER — Encounter: Payer: Self-pay | Admitting: Adult Health

## 2023-11-10 ENCOUNTER — Ambulatory Visit (INDEPENDENT_AMBULATORY_CARE_PROVIDER_SITE_OTHER): Admitting: Adult Health

## 2023-11-10 ENCOUNTER — Encounter: Payer: Self-pay | Admitting: Adult Health

## 2023-11-10 VITALS — BP 110/70 | HR 70 | Temp 98.4°F | Ht 66.0 in | Wt 183.0 lb

## 2023-11-10 DIAGNOSIS — R1030 Lower abdominal pain, unspecified: Secondary | ICD-10-CM | POA: Diagnosis not present

## 2023-11-10 DIAGNOSIS — R8290 Unspecified abnormal findings in urine: Secondary | ICD-10-CM

## 2023-11-10 LAB — POCT URINALYSIS DIPSTICK
Bilirubin, UA: NEGATIVE
Blood, UA: POSITIVE
Glucose, UA: NEGATIVE
Ketones, UA: NEGATIVE
Nitrite, UA: NEGATIVE
Protein, UA: NEGATIVE
Spec Grav, UA: 1.02 (ref 1.010–1.025)
Urobilinogen, UA: 0.2 U/dL
pH, UA: 6 (ref 5.0–8.0)

## 2023-11-10 NOTE — Progress Notes (Signed)
 Subjective:    Patient ID: Colleen Reynolds, female    DOB: Nov 02, 1945, 78 y.o.   MRN: [id number]  HPI 78 year old female who  has a past medical history of Anemia, Atrial fibrillation (HCC), Depression, Diverticulosis, GERD (gastroesophageal reflux disease), History of kidney stones, Hypertension, Kidney stones (2008), OA (osteoarthritis) of hip, osteoporosis, Shingles, and Vitamin D deficiency.  She presents to the office today for an acute issue.   She reports that for the last few weeks she has been experiencing symptoms concerning for a UTI or kidney stone.  Her symptoms include that of odorous urine, lower pelvic pressure and urinary urgency/frequency and low back pain. Her symptoms are present daily but intermittently. She has similar symptoms in 2023 which ended up being an impacted kidney stone which caused her to hospitalized.   She has not had any fevers or chills but states " I just don't feel well."   Review of Systems See HPI   Past Medical History:  Diagnosis Date   Anemia    Atrial fibrillation (HCC)    Depression    Diverticulosis    GERD (gastroesophageal reflux disease)    History of kidney stones    Hypertension    Kidney stones 2008   OA (osteoarthritis) of hip    osteoporosis    Shingles    Vitamin D deficiency     Social History   Socioeconomic History   Marital status: Married    Spouse name: Not on file   Number of children: Not on file   Years of education: Not on file   Highest education level: Not on file  Occupational History   Not on file  Tobacco Use   Smoking status: Never   Smokeless tobacco: Never  Vaping Use   Vaping status: Never Used  Substance and Sexual Activity   Alcohol  use: Yes    Alcohol /week: 1.0 standard drink of alcohol     Types: 1 Standard drinks or equivalent per week    Comment: socially   Drug use: Not Currently    Types: Marijuana    Comment: LSD stopped 1969   Sexual activity: Not on file  Other Topics  Concern   Not on file  Social History Narrative   Not on file   Social Drivers of Health   Financial Resource Strain: Low Risk  (03/16/2023)   Overall Financial Resource Strain (CARDIA)    Difficulty of Paying Living Expenses: Not hard at all  Food Insecurity: No Food Insecurity (03/16/2023)   Hunger Vital Sign    Worried About Running Out of Food in the Last Year: Never true    Ran Out of Food in the Last Year: Never true  Transportation Needs: No Transportation Needs (03/16/2023)   PRAPARE - Administrator, Civil Service (Medical): No    Lack of Transportation (Non-Medical): No  Physical Activity: Inactive (03/16/2023)   Exercise Vital Sign    Days of Exercise per Week: 0 days    Minutes of Exercise per Session: 0 min  Stress: No Stress Concern Present (03/16/2023)   [employer] of Occupational Health - Occupational Stress Questionnaire    Feeling of Stress : Not at all  Social Connections: Moderately Isolated (03/16/2023)   Social Connection and Isolation Panel [NHANES]    Frequency of Communication with Friends and Family: More than three times a week    Frequency of Social Gatherings with Friends and Family: More than three times a week  Attends Religious Services: Never    Active Member of Clubs or Organizations: No    Attends Banker Meetings: Never    Marital Status: Married  Catering manager Violence: Not At Risk (03/16/2023)   Humiliation, Afraid, Rape, and Kick questionnaire    Fear of Current or Ex-Partner: No    Emotionally Abused: No    Physically Abused: No    Sexually Abused: No    Past Surgical History:  Procedure Laterality Date   ABDOMINAL HYSTERECTOMY     brachialrsadius puritis     BREAST REDUCTION SURGERY  1985   COLONOSCOPY  2005   CYSTOSCOPY W/ URETERAL STENT PLACEMENT Right 07/11/2021   Procedure: CYSTOSCOPY WITH RETROGRADE PYELOGRAM/URETERAL STENT PLACEMENT;  Surgeon: Andrez Banker, MD;  Location: [facility];   Service: Urology;  Laterality: Right;   CYSTOSCOPY/URETEROSCOPY/HOLMIUM LASER/STENT PLACEMENT Right 07/27/2021   Procedure: CYSTOSCOPY, RETROGRADE PYELOGRAM, URETEROSCOPY, HOLMIUM LASER, STENT PLACEMENT;  Surgeon: Lahoma Pigg, MD;  Location: [facility];  Service: Urology;  Laterality: Right;  ONLY NEEDS 60 MIN   FRACTURE SURGERY Left 2020   dislocated left elbow    IR NEPHROSTOMY PLACEMENT RIGHT  07/10/2021   KNEE ARTHROSCOPY WITH EXCISION BAKER'S CYST     KNEE SURGERY     1967   LITHOTRIPSY  2008    Family History  Problem Relation Age of Onset   Stroke Mother    Stroke Father    Cancer Sister    Depression Daughter    Migraines Daughter    Colon cancer Neg Hx    Esophageal cancer Neg Hx    Rectal cancer Neg Hx    Stomach cancer Neg Hx     Allergies  Allergen Reactions   Clindamycin/Lincomycin Nausea And Vomiting   Lovastatin Other (See Comments)    Fatigue, muscle aches   Penicillins Rash    Tolerated Rocephin  07/10/21    Current Outpatient Medications on File Prior to Visit  Medication Sig Dispense Refill   carvedilol  (COREG ) 12.5 MG tablet Take 1 tablet (12.5 mg total) by mouth 2 (two) times daily with a meal. 180 tablet 3   clonazePAM  (KLONOPIN ) 0.5 MG tablet Take 1 tablet (0.5 mg total) by mouth 3 (three) times daily as needed for anxiety. 90 tablet 1   ELIQUIS  5 MG TABS tablet TAKE 1 TABLET BY MOUTH TWICE A DAY 180 tablet 1   famotidine  (PEPCID ) 20 MG tablet Take 20 mg by mouth daily.      FLUoxetine  (PROZAC ) 40 MG capsule TAKE 1 CAPSULE (40 MG TOTAL) BY MOUTH DAILY. 90 capsule 1   prednisoLONE acetate (PRED FORTE) 1 % ophthalmic suspension Place 1 drop into the left eye 4 (four) times daily.     Red Yeast Rice 600 MG CAPS Take 600 mg by mouth in the morning and at bedtime.     valACYclovir  (VALTREX ) 1000 MG tablet Take 1,000 mg by mouth 3 (three) times daily.     No current facility-administered medications on file prior to visit.    BP 110/70   Pulse 70   Temp  98.4 F (36.9 C) (Oral)   Ht 5\' 6"  (1.676 m)   Wt 183 lb (83 kg)   SpO2 98%   BMI 29.54 kg/m       Objective:   Physical Exam Vitals and nursing note reviewed.  Constitutional:      Appearance: Normal appearance.  Cardiovascular:     Rate and Rhythm: Normal rate and regular rhythm.  Pulses: Normal pulses.     Heart sounds: Normal heart sounds.  Pulmonary:     Effort: Pulmonary effort is normal.     Breath sounds: Normal breath sounds.  Abdominal:     General: Abdomen is flat. Bowel sounds are normal.     Palpations: Abdomen is soft.     Tenderness: There is right CVA tenderness (?) and left CVA tenderness (?).  Skin:    General: Skin is warm and dry.  Neurological:     General: No focal deficit present.     Mental Status: She is alert and oriented to person, place, and time.  Psychiatric:        Mood and Affect: Mood normal.        Behavior: Behavior normal.        Thought Content: Thought content normal.        Judgment: Judgment normal.        Assessment & Plan:   1. Abnormal urine odor (Primary)  - POC Urinalysis Dipstick + blood and leuks.  - Will send culture and order CT renal stone study - Culture, Urine  2. Lower abdominal pain  - CT RENAL STONE STUDY; Future   Time spent with patient today was 34 minutes which consisted of chart review, discussing  UTIs and kidney stones, work up, treatment, listening, answering questions and documentation.

## 2023-11-13 LAB — URINE CULTURE
MICRO NUMBER:: 16430590
SPECIMEN QUALITY:: ADEQUATE

## 2023-11-15 ENCOUNTER — Other Ambulatory Visit: Payer: Self-pay | Admitting: Adult Health

## 2023-11-15 ENCOUNTER — Encounter: Payer: Self-pay | Admitting: Adult Health

## 2023-11-15 MED ORDER — CIPROFLOXACIN HCL 500 MG PO TABS: 500.0000 mg | ORAL_TABLET | Freq: Two times a day (BID) | ORAL | 0 refills | Status: AC

## 2023-12-01 ENCOUNTER — Encounter: Payer: Self-pay | Admitting: *Deleted

## 2023-12-09 ENCOUNTER — Encounter: Payer: Self-pay | Admitting: Emergency Medicine

## 2023-12-12 DIAGNOSIS — I480 Paroxysmal atrial fibrillation: Secondary | ICD-10-CM | POA: Diagnosis not present

## 2023-12-13 ENCOUNTER — Other Ambulatory Visit: Payer: Self-pay | Admitting: Adult Health

## 2023-12-13 DIAGNOSIS — I4891 Unspecified atrial fibrillation: Secondary | ICD-10-CM

## 2023-12-13 DIAGNOSIS — F339 Major depressive disorder, recurrent, unspecified: Secondary | ICD-10-CM

## 2023-12-13 LAB — CBC
Hematocrit: 43.9 % (ref 34.0–46.6)
Hemoglobin: 14.3 g/dL (ref 11.1–15.9)
MCH: 30.3 pg (ref 26.6–33.0)
MCHC: 32.6 g/dL (ref 31.5–35.7)
MCV: 93 fL (ref 79–97)
Platelets: 229 10*3/uL (ref 150–450)
RBC: 4.72 x10E6/uL (ref 3.77–5.28)
RDW: 12.7 % (ref 11.7–15.4)
WBC: 4.7 10*3/uL (ref 3.4–10.8)

## 2023-12-13 LAB — BASIC METABOLIC PANEL WITH GFR
BUN/Creatinine Ratio: 10 — ABNORMAL LOW (ref 12–28)
BUN: 11 mg/dL (ref 8–27)
CO2: 21 mmol/L (ref 20–29)
Calcium: 10 mg/dL (ref 8.7–10.3)
Chloride: 106 mmol/L (ref 96–106)
Creatinine, Ser: 1.06 mg/dL — ABNORMAL HIGH (ref 0.57–1.00)
Glucose: 106 mg/dL — ABNORMAL HIGH (ref 70–99)
Potassium: 4.4 mmol/L (ref 3.5–5.2)
Sodium: 143 mmol/L (ref 134–144)
eGFR: 54 mL/min/{1.73_m2} — ABNORMAL LOW (ref >59)

## 2023-12-16 ENCOUNTER — Telehealth: Payer: Self-pay

## 2023-12-16 NOTE — Telephone Encounter (Signed)
 Spoke with patient to complete pre-procedure call.     New medical conditions?  NO Recent hospitalizations or surgeries? NO Started any new medications? NO Patient made aware to contact office to inform of any new medications started. Any changes in activities of daily living? NO  Pre-procedure testing scheduled: CT on Wednesday, June 18 along with lab work after.  Confirmed patient is taking Eliquis  5mg  twice daily and will continue taking medication before procedure or it may need to be rescheduled.  Confirmed patient is scheduled for Atrial Fibrillation Ablation on Wednesday, July 9 with Dr. Marlane Silver. Instructed patient to arrive at the Main Entrance A at [facility]: [address] [place], [place] [address] and check in at Admitting at 6:30 AM  Advised of plan to go home the same day and will only stay overnight if medically necessary. You MUST have a responsible adult to drive you home and MUST be with you the first 24 hours after you arrive home or your procedure could be cancelled.  Patient verbalized understanding to information provided and is agreeable to proceed with procedure.

## 2023-12-21 ENCOUNTER — Ambulatory Visit (HOSPITAL_COMMUNITY)
Admission: RE | Admit: 2023-12-21 | Discharge: 2023-12-21 | Disposition: A | Discharge status: Home / Self Care | Source: Ambulatory Visit | Attending: Cardiovascular Disease | Admitting: Cardiovascular Disease

## 2023-12-21 DIAGNOSIS — I480 Paroxysmal atrial fibrillation: Secondary | ICD-10-CM | POA: Diagnosis not present

## 2023-12-21 DIAGNOSIS — I517 Cardiomegaly: Secondary | ICD-10-CM | POA: Insufficient documentation

## 2023-12-21 DIAGNOSIS — I700 Atherosclerosis of aorta: Secondary | ICD-10-CM | POA: Insufficient documentation

## 2023-12-21 MED ORDER — IOHEXOL 350 MG/ML SOLN
100.0000 mL | Freq: Once | INTRAVENOUS | Status: AC | PRN
  Administered 2023-12-21: 100 mL via INTRAVENOUS

## 2023-12-22 ENCOUNTER — Ambulatory Visit: Payer: Self-pay | Admitting: Cardiovascular Disease

## 2024-01-02 ENCOUNTER — Telehealth (HOSPITAL_COMMUNITY): Payer: Self-pay

## 2024-01-02 NOTE — Telephone Encounter (Addendum)
 Spoke with patient to discuss upcoming procedure.   CT: completed.  Labs: completed.   Any recent signs of acute illness or been started on antibiotics? No Any new medications started? No Any medications to hold? No Any missed doses of blood thinner? No Advised patient to continue taking ANTICOAGULANT: Eliquis  (Apixaban ) twice daily without missing any doses.  Medication instructions:  On the morning of your procedure DO NOT take any medication., including Eliquis  or the procedure may be rescheduled. Nothing to eat or drink after midnight prior to your procedure.  Confirmed patient is scheduled for Atrial Fibrillation Ablation, Atrial Flutter Ablation on Wednesday, July 9 with Dr. Eulas Furbish. Instructed patient to arrive at the Main Entrance A at [facility]: [address] [place], [place] [address] and check in at Admitting at 6:30 AM.  Advised of plan to go home the same day and will only stay overnight if medically necessary. You MUST have a responsible adult to drive you home and MUST be with you the first 24 hours after you arrive home or your procedure could be cancelled.  Patient verbalized understanding to all instructions provided and agreed to proceed with procedure.

## 2024-01-09 ENCOUNTER — Ambulatory Visit (HOSPITAL_COMMUNITY): Admitting: Physician Assistant

## 2024-01-10 NOTE — Pre-Procedure Instructions (Signed)
 Instructed patient on the following items: Arrival time 0615 Nothing to eat or drink after midnight No meds AM of procedure Responsible person to drive you home and stay with you for 24 hrs  Have you missed any doses of anti-coagulant Eliquis- takes twice a day, hasn't missed any doses.  Don't take dose morning of procedure.

## 2024-01-11 ENCOUNTER — Encounter (HOSPITAL_COMMUNITY): Payer: Self-pay | Admitting: Cardiovascular Disease

## 2024-01-11 ENCOUNTER — Other Ambulatory Visit: Payer: Self-pay

## 2024-01-11 ENCOUNTER — Encounter (HOSPITAL_COMMUNITY)
Admission: RE | Disposition: A | Discharge status: Home / Self Care | Payer: Self-pay | Source: Home / Self Care | Attending: Cardiovascular Disease

## 2024-01-11 ENCOUNTER — Ambulatory Visit (HOSPITAL_COMMUNITY)
Admission: RE | Admit: 2024-01-11 | Discharge: 2024-01-11 | Disposition: A | Discharge status: Home / Self Care | Attending: Cardiovascular Disease | Admitting: Cardiovascular Disease

## 2024-01-11 ENCOUNTER — Ambulatory Visit (HOSPITAL_COMMUNITY): Admitting: Registered Nurse

## 2024-01-11 DIAGNOSIS — I10 Essential (primary) hypertension: Secondary | ICD-10-CM | POA: Insufficient documentation

## 2024-01-11 DIAGNOSIS — I4892 Unspecified atrial flutter: Secondary | ICD-10-CM | POA: Diagnosis not present

## 2024-01-11 DIAGNOSIS — K219 Gastro-esophageal reflux disease without esophagitis: Secondary | ICD-10-CM | POA: Diagnosis not present

## 2024-01-11 DIAGNOSIS — E785 Hyperlipidemia, unspecified: Secondary | ICD-10-CM | POA: Diagnosis not present

## 2024-01-11 DIAGNOSIS — Z79899 Other long term (current) drug therapy: Secondary | ICD-10-CM | POA: Diagnosis not present

## 2024-01-11 DIAGNOSIS — I4819 Other persistent atrial fibrillation: Secondary | ICD-10-CM | POA: Diagnosis not present

## 2024-01-11 DIAGNOSIS — Z7901 Long term (current) use of anticoagulants: Secondary | ICD-10-CM | POA: Diagnosis not present

## 2024-01-11 DIAGNOSIS — I4891 Unspecified atrial fibrillation: Secondary | ICD-10-CM

## 2024-01-11 DIAGNOSIS — D6869 Other thrombophilia: Secondary | ICD-10-CM | POA: Insufficient documentation

## 2024-01-11 HISTORY — PX: A-FLUTTER ABLATION: EP1230

## 2024-01-11 HISTORY — PX: ATRIAL FIBRILLATION ABLATION: EP1191

## 2024-01-11 LAB — POCT ACTIVATED CLOTTING TIME: Activated Clotting Time: 469 s

## 2024-01-11 MED ORDER — ONDANSETRON HCL 4 MG/2ML IJ SOLN: 4.0000 mg | Freq: Four times a day (QID) | INTRAMUSCULAR | Status: DC | PRN

## 2024-01-11 MED ORDER — ONDANSETRON HCL 4 MG/2ML IJ SOLN
INTRAMUSCULAR | Status: DC | PRN
Start: 2024-01-11 — End: 2024-01-11
  Administered 2024-01-11: 4 mg via INTRAVENOUS

## 2024-01-11 MED ORDER — ACETAMINOPHEN 500 MG PO TABS
1000.0000 mg | ORAL_TABLET | Freq: Once | ORAL | Status: AC
  Administered 2024-01-11: 1000 mg via ORAL
  Filled 2024-01-11: qty 2

## 2024-01-11 MED ORDER — ROCURONIUM BROMIDE 10 MG/ML (PF) SYRINGE
PREFILLED_SYRINGE | INTRAVENOUS | Status: DC | PRN
  Administered 2024-01-11: 50 mg via INTRAVENOUS
  Administered 2024-01-11: 20 mg via INTRAVENOUS
  Administered 2024-01-11: 10 mg via INTRAVENOUS

## 2024-01-11 MED ORDER — ATROPINE SULFATE 1 MG/10ML IJ SOSY
PREFILLED_SYRINGE | INTRAMUSCULAR | Status: AC
  Filled 2024-01-11: qty 30

## 2024-01-11 MED ORDER — LIDOCAINE 2% (20 MG/ML) 5 ML SYRINGE
INTRAMUSCULAR | Status: DC | PRN
  Administered 2024-01-11: 20 mg via INTRAVENOUS

## 2024-01-11 MED ORDER — PHENYLEPHRINE 80 MCG/ML (10ML) SYRINGE FOR IV PUSH (FOR BLOOD PRESSURE SUPPORT)
PREFILLED_SYRINGE | INTRAVENOUS | Status: DC | PRN
  Administered 2024-01-11: 160 ug via INTRAVENOUS
  Administered 2024-01-11: 200 ug via INTRAVENOUS
  Administered 2024-01-11 (×2): 160 ug via INTRAVENOUS

## 2024-01-11 MED ORDER — HEPARIN (PORCINE) IN NACL 1000-0.9 UT/500ML-% IV SOLN
INTRAVENOUS | Status: DC | PRN
  Administered 2024-01-11 (×3): 500 mL

## 2024-01-11 MED ORDER — ATROPINE SULFATE 1 MG/10ML IJ SOSY
PREFILLED_SYRINGE | INTRAMUSCULAR | Status: DC | PRN
  Administered 2024-01-11: 1 mg via INTRAVENOUS

## 2024-01-11 MED ORDER — DEXAMETHASONE SODIUM PHOSPHATE 10 MG/ML IJ SOLN
INTRAMUSCULAR | Status: DC | PRN
  Administered 2024-01-11: 10 mg via INTRAVENOUS

## 2024-01-11 MED ORDER — HEPARIN SODIUM (PORCINE) 1000 UNIT/ML IJ SOLN
INTRAMUSCULAR | Status: DC | PRN
  Administered 2024-01-11: 14000 [IU] via INTRAVENOUS

## 2024-01-11 MED ORDER — NITROGLYCERIN 1 MG/10 ML FOR IR/CATH LAB
INTRA_ARTERIAL | Status: DC | PRN
  Administered 2024-01-11: 20 mL

## 2024-01-11 MED ORDER — ACETAMINOPHEN 325 MG PO TABS
650.0000 mg | ORAL_TABLET | ORAL | Status: DC | PRN
Start: 2024-01-11 — End: 2024-01-11

## 2024-01-11 MED ORDER — FENTANYL CITRATE (PF) 100 MCG/2ML IJ SOLN
INTRAMUSCULAR | Status: AC
Start: 2024-01-11 — End: 2024-01-11
  Filled 2024-01-11: qty 2

## 2024-01-11 MED ORDER — PROTAMINE SULFATE 10 MG/ML IV SOLN
INTRAVENOUS | Status: DC | PRN
  Administered 2024-01-11: 50 mg via INTRAVENOUS

## 2024-01-11 MED ORDER — PROPOFOL 10 MG/ML IV BOLUS
INTRAVENOUS | Status: DC | PRN
  Administered 2024-01-11: 30 mg via INTRAVENOUS
  Administered 2024-01-11: 150 mg via INTRAVENOUS

## 2024-01-11 MED ORDER — HEPARIN SODIUM (PORCINE) 1000 UNIT/ML IJ SOLN
INTRAMUSCULAR | Status: AC
  Filled 2024-01-11: qty 20

## 2024-01-11 MED ORDER — SODIUM CHLORIDE 0.9% FLUSH: 3.0000 mL | INTRAVENOUS | Status: DC | PRN

## 2024-01-11 MED ORDER — PHENYLEPHRINE HCL-NACL 20-0.9 MG/250ML-% IV SOLN
INTRAVENOUS | Status: DC | PRN
  Administered 2024-01-11: 40 ug/min via INTRAVENOUS

## 2024-01-11 MED ORDER — NITROGLYCERIN 1 MG/10 ML FOR IR/CATH LAB
INTRA_ARTERIAL | Status: AC
  Filled 2024-01-11: qty 20

## 2024-01-11 MED ORDER — SODIUM CHLORIDE 0.9 % IV SOLN
INTRAVENOUS | Status: DC
Start: 2024-01-11 — End: 2024-01-11

## 2024-01-11 MED ORDER — SODIUM CHLORIDE 0.9 % IV SOLN: 250.0000 mL | INTRAVENOUS | Status: DC | PRN

## 2024-01-11 MED ORDER — FENTANYL CITRATE (PF) 250 MCG/5ML IJ SOLN
INTRAMUSCULAR | Status: DC | PRN
  Administered 2024-01-11 (×2): 50 ug via INTRAVENOUS

## 2024-01-11 MED ORDER — SUGAMMADEX SODIUM 200 MG/2ML IV SOLN
INTRAVENOUS | Status: DC | PRN
  Administered 2024-01-11: 400 mg via INTRAVENOUS

## 2024-01-11 MED ORDER — SODIUM CHLORIDE 0.9% FLUSH: 3.0000 mL | Freq: Two times a day (BID) | INTRAVENOUS | Status: DC

## 2024-01-11 NOTE — H&P (Signed)
 Electrophysiology Office Note:    Date:  01/11/2024   ID:  Colleen Reynolds, DOB June 26, 1946, MRN [id number]  PCP:  Merna Huxley, NP   [facility] Providers Cardiologist:  Jerel Balding, MD Electrophysiologist:  Eulas FORBES Furbish, MD     Referring MD: No ref. provider found   History of Present Illness:    Colleen Reynolds is a 78 y.o. female with a medical history significant for paroxysmal atrial fibrillation, hypertension, hyperlipidemia, referred for arrhythmia management.     Discussed the use of AI scribe software for clinical note transcription with the patient, who gave verbal consent to proceed.  History of Present Illness Colleen Reynolds is a 78 year old female with atrial fibrillation who presents for evaluation of her condition.  She was first diagnosed with atrial fibrillation approximately 40 years ago, as she recalls, by her PCP. The condition was not addressed again until November or December of 2016 when she became very ill, leading to a hospitalization where atrial fibrillation was identified as a significant issue. She was in the ICU for six days and remained in atrial fibrillation throughout her stay.  She has difficulty determining when she is in normal rhythm but uses her Apple Watch to monitor her heart rhythm. Recently, she noted two instances in the past week where her watch indicated a normal rhythm, with a 44% normal rhythm reading last week, which is an improvement from a previous 66% atrial fibrillation reading. She also performed an EKG yesterday that showed normal sinus rhythm.  She has paroxysmal atrial fibrillation and is currently on blood thinners. She has not undergone any ablation procedures yet.         Today, She reports that she feels fine. She does not have any palpitations or lightheadedness. I reviewed the patient's CT and labs. There was no LAA thrombus. she  has not missed any doses of anticoagulation, and she took her  dose last night. There have been no changes in the patient's diagnoses, medications, or condition since our recent clinic visit.   EKGs/Labs/Other Studies Reviewed Today:     Echocardiogram:  TTE January 2023 LVEF 55 to 60%.  Normal RV systolic function.     EKG:         Physical Exam:    VS:  BP 130/74   Pulse 72   Temp 98 F (36.7 C) (Oral)   Resp 17   Ht 5' 6 (1.676 m)   Wt 79.4 kg   SpO2 95%   BMI 28.25 kg/m     Wt Readings from Last 3 Encounters:  01/11/24 79.4 kg  11/10/23 83 kg  10/24/23 81.6 kg     GEN: Well nourished, well developed in no acute distress CARDIAC: RRR, no murmurs, rubs, gallops RESPIRATORY:  Normal work of breathing MUSCULOSKELETAL: no edema    ASSESSMENT & PLAN:     Paroxysmal atrial fibrillation She is in AF today I reviewed tracing from her watch yesterday that showed sinus rhythm She does not have significant symptoms We discussed the rationale for maintenance of sinus rhythm including reduced risk of stroke and development of symptoms. Using a joint decision-making approach, we decided to proceed with ablation. The EF appears somewhat organized and some EKGs, so I think she will benefit from a flutter line at the time of her ablation.  We discussed the indication, rationale, logistics, anticipated benefits, and potential risks of the ablation procedure including but not limited to -- bleed at the groin  access site, chest pain, damage to nearby organs such as the diaphragm, lungs, or esophagus, need for a drainage tube, or prolonged hospitalization. I explained that the risk for stroke, heart attack, need for open chest surgery, or even death is very low but not zero. she  expressed understanding and wishes to proceed.   Secondary hypercoagulable state CHA2DS2-VASc score is 4 Continue Eliquis  5 mg twice daily  Hypertension BP controlled today      Signed, Eulas FORBES Furbish, MD  01/11/2024 7:27 AM    [facility]

## 2024-01-11 NOTE — Discharge Instructions (Signed)

## 2024-01-11 NOTE — Anesthesia Procedure Notes (Signed)
 Procedure Name: Intubation Date/Time: 01/11/2024 8:41 AM  Performed by: Christopher Comings, CRNAPre-anesthesia Checklist: Patient identified, Emergency Drugs available, Suction available and Patient being monitored Patient Re-evaluated:Patient Re-evaluated prior to induction Oxygen Delivery Method: Circle system utilized Preoxygenation: Pre-oxygenation with 100% oxygen Induction Type: IV induction Ventilation: Mask ventilation without difficulty Laryngoscope Size: Mac and 4 Grade View: Grade II Tube type: Oral Tube size: 7.0 mm Number of attempts: 1 Airway Equipment and Method: Stylet and Oral airway Placement Confirmation: ETT inserted through vocal cords under direct vision, positive ETCO2 and breath sounds checked- equal and bilateral Secured at: 21 cm Tube secured with: Tape Dental Injury: Teeth and Oropharynx as per pre-operative assessment

## 2024-01-11 NOTE — Transfer of Care (Signed)
 Immediate Anesthesia Transfer of Care Note  Patient: Colleen Reynolds  Procedure(s) Performed: ATRIAL FIBRILLATION ABLATION A-FLUTTER ABLATION  Patient Location: PACU  Anesthesia Type:General  Level of Consciousness: awake, alert , and oriented  Airway & Oxygen Therapy: Patient Spontanous Breathing  Post-op Assessment: Report given to RN and Post -op Vital signs reviewed and stable  Post vital signs: Reviewed and stable  Last Vitals:  Vitals Value Taken Time  BP 136/82 1020  Temp    Pulse 95 01/11/24 10:20  Resp 20 01/11/24 10:19  SpO2 95 % 01/11/24 10:20  Vitals shown include unfiled device data.  Last Pain:  Vitals:   01/11/24 0711  TempSrc:   PainSc: 0-No pain         Complications: There were no known notable events for this encounter.

## 2024-01-11 NOTE — Anesthesia Preprocedure Evaluation (Signed)
 Anesthesia Evaluation  Patient identified by MRN, date of birth, ID band Patient awake    Reviewed: Allergy & Precautions, NPO status , Patient's Chart, lab work & pertinent test results  Airway Mallampati: III  TM Distance: >3 FB Neck ROM: Full    Dental  (+) Dental Advisory Given   Pulmonary neg pulmonary ROS   breath sounds clear to auscultation       Cardiovascular hypertension, Pt. on medications and Pt. on home beta blockers + dysrhythmias Atrial Fibrillation  Rhythm:Regular Rate:Normal     Neuro/Psych negative neurological ROS     GI/Hepatic Neg liver ROS,GERD  ,,  Endo/Other  negative endocrine ROS    Renal/GU Renal disease     Musculoskeletal  (+) Arthritis ,    Abdominal   Peds  Hematology  (+) Blood dyscrasia   Anesthesia Other Findings   Reproductive/Obstetrics                              Anesthesia Physical Anesthesia Plan  ASA: 2  Anesthesia Plan: General   Post-op Pain Management: Tylenol  PO (pre-op)*   Induction: Intravenous  PONV Risk Score and Plan: 3 and Dexamethasone , Ondansetron  and Treatment may vary due to age or medical condition  Airway Management Planned: Oral ETT  Additional Equipment:   Intra-op Plan:   Post-operative Plan: Extubation in OR  Informed Consent: I have reviewed the patients History and Physical, chart, labs and discussed the procedure including the risks, benefits and alternatives for the proposed anesthesia with the patient or authorized representative who has indicated his/her understanding and acceptance.     Dental advisory given  Plan Discussed with: CRNA  Anesthesia Plan Comments:         Anesthesia Quick Evaluation

## 2024-01-11 NOTE — Anesthesia Postprocedure Evaluation (Signed)
 Anesthesia Post Note  Patient: Colleen Reynolds  Procedure(s) Performed: ATRIAL FIBRILLATION ABLATION A-FLUTTER ABLATION     Patient location during evaluation: PACU Anesthesia Type: General Level of consciousness: awake and alert Pain management: pain level controlled Vital Signs Assessment: post-procedure vital signs reviewed and stable Respiratory status: spontaneous breathing, nonlabored ventilation, respiratory function stable and patient connected to nasal cannula oxygen Cardiovascular status: blood pressure returned to baseline and stable Postop Assessment: no apparent nausea or vomiting Anesthetic complications: no   There were no known notable events for this encounter.  Last Vitals:  Vitals:   01/11/24 1230 01/11/24 1300  BP: 120/72 127/80  Pulse: 71 80  Resp: 13 14  Temp:    SpO2: 97% 98%    Last Pain:  Vitals:   01/11/24 1130  TempSrc:   PainSc: 0-No pain   Pain Goal: Patients Stated Pain Goal: 0 (01/11/24 1057)                 Epifanio Lamar BRAVO

## 2024-01-12 ENCOUNTER — Telehealth (HOSPITAL_COMMUNITY): Payer: Self-pay

## 2024-01-12 NOTE — Telephone Encounter (Signed)
 Spoke with patient to complete post procedure follow up call.  Patient reports no complications with groin sites.   Instructions reviewed with patient:  Remove large bandage at puncture site after 24 hours. It is normal to have bruising, tenderness, mild swelling, and a pea or marble sized lump/knot at the groin site which can take up to three months to resolve.  Get help right away if you notice sudden swelling at the puncture site.  Check your puncture site every day for signs of infection: fever, redness, swelling, pus drainage, warmth, foul odor or excessive pain. If this occurs, please call the office at [phone], to speak with the nurse. Get help right away if your puncture site is bleeding and the bleeding does not stop after applying firm pressure to the area.  You may continue to have skipped beats/ atrial fibrillation during the first several months after your procedure.  It is very important not to miss any doses of your blood thinner Eliquis .    You will follow up with the Afib clinic on 02/13/24 and follow up with Dr.Augustus Mealor on 04/16/24.    Patient verbalized understanding to all instructions provided.

## 2024-01-13 MED FILL — Fentanyl Citrate Preservative Free (PF) Inj 100 MCG/2ML: INTRAMUSCULAR | Qty: 2 | Status: AC

## 2024-02-13 ENCOUNTER — Ambulatory Visit (HOSPITAL_COMMUNITY)
Admission: RE | Admit: 2024-02-13 | Discharge: 2024-02-13 | Disposition: A | Discharge status: Home / Self Care | Source: Ambulatory Visit | Attending: Physician Assistant | Admitting: Physician Assistant

## 2024-02-13 ENCOUNTER — Encounter (HOSPITAL_COMMUNITY): Payer: Self-pay | Admitting: Physician Assistant

## 2024-02-13 VITALS — BP 118/82 | HR 82 | Ht 66.0 in | Wt 180.6 lb

## 2024-02-13 DIAGNOSIS — D6869 Other thrombophilia: Secondary | ICD-10-CM

## 2024-02-13 DIAGNOSIS — I480 Paroxysmal atrial fibrillation: Secondary | ICD-10-CM

## 2024-02-13 NOTE — Progress Notes (Signed)
 Primary Care Physician: Colleen Huxley, NP Primary Cardiologist: Colleen Balding, MD Electrophysiologist: Colleen Colleen Reynolds Furbish, MD  Referring Physician: Dr Reynolds Reynolds is a 78 y.o. female with a history of HTN, HLD, atrial fibrillation who presents for follow up in the [facility]. She was first diagnosed with atrial fibrillation approximately 40 years ago, as she recalls, by her PCP. The condition was not addressed again until November or December of 2016 when she became very ill, leading to a hospitalization where atrial fibrillation was identified as a significant issue. She was in the ICU for six days and remained in atrial fibrillation throughout her stay. She was seen by Reynolds Reynolds with an increased afib burden on her smart watch. She underwent afib ablation on 01/11/24. Patient is on Eliquis  for stroke prevention.    Patient presents today for follow up for atrial fibrillation. She remains in SR and is doing well post ablation. She denies chest pain or groin issues. No bleeding issues on anticoagulation. Her smart watch has shown only SR.   Today, she denies symptoms of palpitations, chest pain, shortness of breath, orthopnea, PND, lower extremity edema, dizziness, presyncope, syncope, snoring, daytime somnolence, bleeding, or neurologic sequela. The patient is tolerating medications without difficulties and is otherwise without complaint today.    Atrial Fibrillation Risk Factors:  she does not have symptoms or diagnosis of sleep apnea. she does not have a history of rheumatic fever. she does not have a history of alcohol  use.   Atrial Fibrillation Management history:  Previous antiarrhythmic drugs: none Previous cardioversions: none Previous ablations: 01/11/24 Anticoagulation history: Eliquis   ROS- All systems are reviewed and negative except as per the HPI above.  Past Medical History:  Diagnosis Date   Anemia    Atrial fibrillation  (HCC)    Depression    Diverticulosis    GERD (gastroesophageal reflux disease)    History of kidney stones    Hypertension    Kidney stones 2008   OA (osteoarthritis) of hip    osteoporosis    Shingles    Vitamin D deficiency     Current Outpatient Medications  Medication Sig Dispense Refill   carvedilol  (COREG ) 6.25 MG tablet TAKE 1 TABLET BY MOUTH TWICE A DAY WITH FOOD 180 tablet 1   clonazePAM  (KLONOPIN ) 0.5 MG tablet Take 1 tablet (0.5 mg total) by mouth 3 (three) times daily as needed for anxiety. (Patient taking differently: Take 0.25 mg by mouth at bedtime. Take take a 0.25 mg dose during the day as needed for anxiety) 90 tablet 1   ELIQUIS  5 MG TABS tablet TAKE 1 TABLET BY MOUTH TWICE A DAY 180 tablet 1   famotidine  (PEPCID ) 20 MG tablet Take 20 mg by mouth daily.      FLUoxetine  (PROZAC ) 40 MG capsule TAKE 1 CAPSULE (40 MG TOTAL) BY MOUTH DAILY. 90 capsule 1   Red Yeast Rice 600 MG CAPS Take 600 mg by mouth 2 (two) times daily.     No current facility-administered medications for this encounter.    Physical Exam: BP 118/82   Pulse 82   Ht 5' 6 (1.676 m)   Wt 81.9 kg   BMI 29.15 kg/m   GEN: Well nourished, well developed in no acute distress CARDIAC: Regular rate and rhythm, no murmurs, rubs, gallops RESPIRATORY:  Clear to auscultation without rales, wheezing or rhonchi  ABDOMEN: Soft, non-tender, non-distended EXTREMITIES:  No edema; No deformity   Wt  Readings from Last 3 Encounters:  02/13/24 81.9 kg  01/11/24 79.4 kg  11/10/23 83 kg     EKG today demonstrates  SR, LAFB Vent. rate 82 BPM PR interval 172 ms QRS duration 84 ms QT/QTcB 396/462 ms  Echo 07/11/21 demonstrated   1. Left ventricular ejection fraction, by estimation, is 55 to 60%. The  left ventricle has normal function. The left ventricle has no regional  wall motion abnormalities. There is mild left ventricular hypertrophy.  Left ventricular diastolic parameters were normal.   2. Right  ventricular systolic function is normal. The right ventricular  size is normal.   3. The mitral valve is normal in structure. Mild mitral valve  regurgitation.   4. The aortic valve is tricuspid. Aortic valve regurgitation is not  visualized. Aortic valve sclerosis is present, with no evidence of aortic  valve stenosis.   5. The inferior vena cava is dilated in size with <50% respiratory  variability, suggesting right atrial pressure of 15 mmHg.    CHA2DS2-VASc Score = 5  The patient's score is based upon: CHF History: 0 HTN History: 1 Diabetes History: 0 Stroke History: 0 Vascular Disease History: 1 Age Score: 2 Gender Score: 1       ASSESSMENT AND PLAN: Paroxysmal Atrial Fibrillation (ICD10:  I48.0) The patient's CHA2DS2-VASc score is 5, indicating a 7.2% annual risk of stroke.   S/p afib ablation 01/11/24 Patient appears to be maintaining SR Continue Eliquis  5 mg BID with no missed doses for 3 months post ablation.  Continue carvedilol  6.25 mg BID Apple Watch for home monitoring.   Secondary Hypercoagulable State (ICD10:  D68.69) The patient is at significant risk for stroke/thromboembolism based upon her CHA2DS2-VASc Score of 5.  Continue Apixaban  (Eliquis ). No bleeding issues.   CAD CAC scoare 522 No anginal symptoms Followed by Colleen Reynolds Reynolds   HTN Stable on current regimen   Follow up with Colleen Colleen Reynolds as scheduled.        [facility] [address] [place], [place] [address] [phone]

## 2024-03-16 ENCOUNTER — Encounter: Payer: Self-pay | Admitting: Adult Health

## 2024-03-23 ENCOUNTER — Ambulatory Visit: Admitting: Cardiovascular Disease

## 2024-04-16 ENCOUNTER — Ambulatory Visit: Attending: Cardiovascular Disease | Admitting: Cardiovascular Disease

## 2024-04-16 ENCOUNTER — Encounter: Payer: Self-pay | Admitting: Cardiovascular Disease

## 2024-04-16 VITALS — BP 112/77 | HR 88 | Ht 66.0 in | Wt 179.0 lb

## 2024-04-16 DIAGNOSIS — I482 Chronic atrial fibrillation, unspecified: Secondary | ICD-10-CM | POA: Diagnosis not present

## 2024-04-16 DIAGNOSIS — I480 Paroxysmal atrial fibrillation: Secondary | ICD-10-CM | POA: Diagnosis not present

## 2024-04-16 NOTE — Patient Instructions (Signed)
 Medication Instructions:  Your physician recommends that you continue on your current medications as directed. Please refer to the Current Medication list given to you today.  *If you need a refill on your cardiac medications before your next appointment, please call your pharmacy*  Lab Work: None ordered.  If you have labs (blood work) drawn today and your tests are completely normal, you will receive your results only by: MyChart Message (if you have MyChart) OR A paper copy in the mail If you have any lab test that is abnormal or we need to change your treatment, we will call you to review the results.  Testing/Procedures: None ordered.   Follow-Up: At [facility], you and your health needs are our priority.  As part of our continuing mission to provide you with exceptional heart care, our providers are all part of one team.  This team includes your primary Cardiologist (physician) and Advanced Practice Providers or APPs (Physician Assistants and Nurse Practitioners) who all work together to provide you with the care you need, when you need it.  Your next appointment:   6 months with Dr Mealor

## 2024-04-16 NOTE — Progress Notes (Signed)
  Electrophysiology Office Note:    Date:  04/16/2024   ID:  Colleen Reynolds, DOB 02-Dec-1945, MRN [id number]  PCP:  Merna Huxley, NP   [facility] Providers Cardiologist:  Jerel Balding, MD Electrophysiologist:  Eulas FORBES Furbish, MD     Referring MD: Merna Huxley, NP   History of Present Illness:    Colleen Reynolds is a 78 y.o. female with a medical history significant for paroxysmal atrial fibrillation, hypertension, hyperlipidemia, referred for arrhythmia management.     Discussed the use of AI scribe software for clinical note transcription with the patient, who gave verbal consent to proceed.  History of Present Illness Colleen Reynolds is a 78 year old female with atrial fibrillation who presents for evaluation of her condition.  She was first diagnosed with atrial fibrillation approximately 40 years ago, as she recalls, by her PCP. The condition was not addressed again until November or December of 2016 when she became very ill, leading to a hospitalization where atrial fibrillation was identified as a significant issue. She was in the ICU for six days and remained in atrial fibrillation throughout her stay.  She has difficulty determining when she is in normal rhythm but uses her Apple Watch to monitor her heart rhythm. Recently, she noted two instances in the past week where her watch indicated a normal rhythm, with a 44% normal rhythm reading last week, which is an improvement from a previous 66% atrial fibrillation reading. She also performed an EKG yesterday that showed normal sinus rhythm.  She underwent atrial fibrillation ablation on January 11, 2024.  The procedure was uncomplicated.  She has been monitoring her rhythm with an Apple watch and has not detected any atrial fibrillation.  She has felt great and not had any symptoms of recurrence.         Today, She reports that she feels fine. She does not have any palpitations or  lightheadedness.  EKGs/Labs/Other Studies Reviewed Today:     Echocardiogram:  TTE January 2023 LVEF 55 to 60%.  Normal RV systolic function.     EKG:         Physical Exam:    VS:  BP 112/77 (BP Location: Right Arm, Patient Position: Sitting, Cuff Size: Large)   Pulse 88   Ht 5' 6 (1.676 m)   Wt 179 lb (81.2 kg)   SpO2 97%   BMI 28.89 kg/m     Wt Readings from Last 3 Encounters:  04/16/24 179 lb (81.2 kg)  02/13/24 180 lb 9.6 oz (81.9 kg)  01/11/24 175 lb (79.4 kg)     GEN: Well nourished, well developed in no acute distress CARDIAC: RRR, no murmurs, rubs, gallops RESPIRATORY:  Normal work of breathing MUSCULOSKELETAL: no edema    ASSESSMENT & PLAN:     Paroxysmal atrial fibrillation Status post ablation for atrial fibrillation and possible atrial flutter January 11, 2024 No symptoms of recurrence since ablation, no Apple watch detections of atrial fibrillation  Continue monitoring with Apple watch  Secondary hypercoagulable state CHA2DS2-VASc score is 4 Continue Eliquis  5 mg twice daily She would like to discontinue blood thinner; I am reluctant to do so this early on with a CHA2DS2-VASc score of 4 We will readdress in follow-up in 6 months  Hypertension BP controlled today      Signed, Eulas FORBES Furbish, MD  04/16/2024 12:32 PM    [facility]

## 2024-04-19 ENCOUNTER — Other Ambulatory Visit: Payer: Self-pay | Admitting: Adult Health

## 2024-04-19 DIAGNOSIS — I4891 Unspecified atrial fibrillation: Secondary | ICD-10-CM

## 2024-05-01 ENCOUNTER — Ambulatory Visit (INDEPENDENT_AMBULATORY_CARE_PROVIDER_SITE_OTHER): Admitting: Family Medicine

## 2024-05-01 VITALS — Wt 175.0 lb

## 2024-05-01 DIAGNOSIS — Z0000 Encounter for general adult medical examination without abnormal findings: Secondary | ICD-10-CM | POA: Diagnosis not present

## 2024-05-01 NOTE — Patient Instructions (Addendum)
 I really enjoyed getting to talk with you today! I am available on Tuesdays and Thursdays for virtual visits if you have any questions or concerns, or if I can be of any further assistance.   CHECKLIST FROM ANNUAL WELLNESS VISIT:  -Follow up (please call to schedule if not scheduled after visit):   -yearly for annual wellness visit with primary care office  Here is a list of your preventive care/health maintenance measures and the plan for each if any are due:  PLAN For any measures below that may be due:   Health Maintenance  Topic Date Due   COVID-19 Vaccine (8 - Pfizer risk 2025-26 season) 10/12/2024   Medicare Annual Wellness (AWV)  05/01/2025   DTaP/Tdap/Td (4 - Td or Tdap) 10/19/2028   Pneumococcal Vaccine: 50+ Years  Completed   Influenza Vaccine  Completed   DEXA SCAN  Completed   Hepatitis C Screening  Completed   Zoster Vaccines- Shingrix  Completed   Meningococcal B Vaccine  Aged Out   Mammogram  Discontinued   Colonoscopy  Discontinued    -See a dentist at least yearly  -Get your eyes checked and then per your eye specialist's recommendation   -I have included below further information regarding a healthy whole foods based diet, physical activity guidelines for adults, stress management and opportunities for social connections. I hope you find this information useful.   -----------------------------------------------------------------------------------------------------------------------------------------------------------------------------------------------------------------------------------------------------------    NUTRITION: -eat real food: lots of colorful vegetables (half the plate) and fruits -5-7 servings of vegetables and fruits per day (fresh or steamed is best), exp. 2 servings of vegetables with lunch and dinner and 2 servings of fruit per day. Berries and greens such as kale and collards are great choices.  -consume on a regular basis:  fresh  fruits, fresh veggies, fish, nuts, seeds, healthy oils (such as olive oil, avocado oil), whole grains (make sure for bread/pasta/crackers/etc., that the first ingredient on label contains the word whole), legumes. -can eat small amounts of dairy and lean meat (no larger than the palm of your hand), but avoid processed meats such as ham, bacon, lunch meat, etc. -drink water -try to avoid fast food and pre-packaged foods, processed meat, ultra processed foods/beverages (donuts, candy, etc.) -most experts advise limiting sodium to < 2300mg  per day, should limit further is any chronic conditions such as high blood pressure, heart disease, diabetes, etc. The American Heart Association advised that < 1500mg  is is ideal -try to avoid foods/beverages that contain any ingredients with names you do not recognize  -try to avoid foods/beverages  with added sugar or sweeteners/sweets  -try to avoid sweet drinks (including diet drinks): soda, juice, Gatorade, sweet tea, power drinks, diet drinks -try to avoid white rice, white bread, pasta (unless whole grain)  EXERCISE GUIDELINES FOR ADULTS: -if you wish to increase your physical activity, do so gradually and with the approval of your doctor -STOP and seek medical care immediately if you have any chest pain, chest discomfort or trouble breathing when starting or increasing exercise  -move and stretch your body, legs, feet and arms when sitting for long periods -Physical activity guidelines for optimal health in adults: -get at least 150 minutes per week of moderate exercise (can talk, but not sing); this is about 20-30 minutes of sustained activity 5-7 days per week or two 10-15 minute episodes of sustained activity 5-7 days per week -do some muscle building/resistance training/strength training at least 2 days per week  -balance exercises 3+ days per week:  Stand somewhere where you have something sturdy to hold onto if you lose balance    1) lift up on  toes, then back down, start with 5x per day and work up to 20x   2) stand and lift one leg straight out to the side so that foot is a few inches of the floor, start with 5x each side and work up to 20x each side   3) stand on one foot, start with 5 seconds each side and work up to 20 seconds on each side  If you need ideas or help with getting more active:  -Silver sneakers https://tools.silversneakers.com  -Walk with a Doc: Http://www.duncan-williams.com/  -try to include resistance (weight lifting/strength building) and balance exercises twice per week: or the following link for ideas: http://castillo-powell.com/  buyducts.dk  STRESS MANAGEMENT: -can try meditating, or just sitting quietly with deep breathing while intentionally relaxing all parts of your body for 5 minutes daily -if you need further help with stress, anxiety or depression please follow up with your primary doctor or contact the wonderful folks at [facility]: [phone]  SOCIAL CONNECTIONS: -options in [place] if you wish to engage in more social and exercise related activities:  -Silver sneakers https://tools.silversneakers.com  -Walk with a Doc: Http://www.duncan-williams.com/  -Check out the [facility] Active Adults 50+ section on the [employer] of [employer] (hiking clubs, book clubs, cards and games, chess, exercise classes, aquatic classes and much more) - see the website for details: https://www.[place].gov/departments/parks-recreation/active-adults50  -YouTube has lots of exercise videos for different ages and abilities as well  -[facility] (a variety of indoor and outdoor inperson activities for adults). [phone]. [address]  -Virtual Online Classes (a variety of topics): see seniorplanet.org or call [phone]  -consider volunteering at a school, hospice center,  church, senior center or elsewhere

## 2024-05-01 NOTE — Progress Notes (Addendum)
 PATIENT CHECK-IN and HEALTH RISK ASSESSMENT QUESTIONNAIRE:  -completed by phone/video for upcoming Medicare Preventive Visit  Pre-Visit Check-in: 1)Vitals (height, wt, BP, etc) - record in vitals section for visit on day of visit Request home vitals (wt, BP, etc.) and enter into vitals, THEN update Vital Signs SmartPhrase below at the top of the HPI. See below.  2)Review and Update Medications, Allergies PMH, Surgeries, Social history in Epic 3)Hospitalizations in the last year with date/reason? no  4)Review and Update Care Team (patient's specialists) in Epic 5) Complete PHQ9 in Epic  6) Complete Fall Screening in Epic 7)Review all Health Maintenance Due and order if not done.  Medicare Wellness Patient Questionnaire:  Answer theses question about your habits: How often do you have a drink containing alcohol ?very occasionally, may 1 time per week How many drinks containing alcohol  do you have on a typical day when you are drinking? 1 drink, wine How often do you have six or more drinks on one occasion?n Have you ever smoked?n Quit date if applicable? na  How many packs a day do/did you smoke? na Do you use smokeless tobacco?n Do you use an illicit drugs?n On average, how many days per week do you engage in moderate to strenuous exercise (like a brisk walk)?1 On average, how many minutes do you engage in exercise at this level?30 Are you sexually active? No concerns for STIs Typical breakfast: usually skips breakfast, sometimes takes milk with vitamins Typical lunch: sometimes has a protein shake - carnation breakfast essentials Typical dinner: protein, veggies, starch Typical snacks: corn chips with salsa  Beverages: water, coke zero  Answer theses question about your everyday activities: Can you perform most household chores?y Are you deaf or have significant trouble hearing?n Do you feel that you have a problem with memory?n Do you feel safe at home?y Last dentist visit? Goes  twice a year 8. Do you have any difficulty performing your everyday activities?n Are you having any difficulty walking, taking medications on your own, and or difficulty managing daily home needs?n Do you have difficulty walking or climbing stairs?n Do you have difficulty dressing or bathing?n Do you have difficulty doing errands alone such as visiting a doctor's office or shopping?n Do you currently have any difficulty preparing food and eating?n Do you currently have any difficulty using the toilet?n Do you have any difficulty managing your finances?n Do you have any difficulties with housekeeping of managing your housekeeping?n   Do you have Advanced Directives in place (Living Will, Healthcare Power or Attorney)? yes   Last eye Exam and location? [place], [place], once a year   Do you currently use prescribed or non-prescribed narcotic or opioid pain medications?n  Do you have a history or close family history of breast, ovarian, tubal or peritoneal cancer or a family member with BRCA (breast cancer susceptibility 1 and 2) gene mutations? See pmh/fh    ----------------------------------------------------------------------------------------------------------------------------------------------------------------------------------------------------------------------  Because this visit was a virtual/telehealth visit, some criteria may be missing or patient reported. Any vitals not documented were not able to be obtained and vitals that have been documented are patient reported.    MEDICARE ANNUAL PREVENTIVE VISIT WITH PROVIDER: (Welcome to Medicare, initial annual wellness or annual wellness exam)  Virtual Visit via Video Note  I connected with Colleen Reynolds on 05/01/24 by a video enabled telemedicine application and verified that I am speaking with the correct person using two identifiers.  Location patient: home Location provider:work or home office Persons participating in  the  virtual visit: patient, provider  Concerns and/or follow up today: stable - reports no concerns today   See HM section in Epic for other details of completed HM.    ROS: negative for report of fevers, unintentional weight loss, vision changes, vision loss, hearing loss or change, chest pain, sob, hemoptysis, melena, hematochezia, hematuria, falls, bleeding or bruising, thoughts of suicide or self harm, memory loss  Patient-completed extensive health risk assessment - reviewed and discussed with the patient: See Health Risk Assessment completed with patient prior to the visit either above or in recent phone note. This was reviewed in detailed with the patient today and appropriate recommendations, orders and referrals were placed as needed per Summary below and patient instructions.   Review of Medical History: -PMH, PSH, Family History and current specialty and care providers reviewed and updated and listed below   Patient Care Team: Merna Huxley, NP as PCP - General (Family Medicine) Croitoru, Jerel, MD as PCP - Cardiology (Cardiology) Mealor, Eulas BRAVO, MD as PCP - Electrophysiology (Cardiology)   Past Medical History:  Diagnosis Date   Anemia    Atrial fibrillation [facility])    Depression    Diverticulosis    GERD (gastroesophageal reflux disease)    History of kidney stones    Hypertension    Kidney stones 2008   OA (osteoarthritis) of hip    osteoporosis    Shingles    Vitamin D deficiency     Past Surgical History:  Procedure Laterality Date   A-FLUTTER ABLATION N/A 01/11/2024   Procedure: A-FLUTTER ABLATION;  Surgeon: Nancey Eulas BRAVO, MD;  Location: [facility];  Service: Cardiovascular;  Laterality: N/A;   ABDOMINAL HYSTERECTOMY     ATRIAL FIBRILLATION ABLATION N/A 01/11/2024   Procedure: ATRIAL FIBRILLATION ABLATION;  Surgeon: Nancey Eulas BRAVO, MD;  Location: [facility];  Service: Cardiovascular;  Laterality: N/A;   brachialrsadius puritis      BREAST REDUCTION SURGERY  1985   COLONOSCOPY  2005   CYSTOSCOPY W/ URETERAL STENT PLACEMENT Right 07/11/2021   Procedure: CYSTOSCOPY WITH RETROGRADE PYELOGRAM/URETERAL STENT PLACEMENT;  Surgeon: Cam Morene ORN, MD;  Location: [facility];  Service: Urology;  Laterality: Right;   CYSTOSCOPY/URETEROSCOPY/HOLMIUM LASER/STENT PLACEMENT Right 07/27/2021   Procedure: CYSTOSCOPY, RETROGRADE PYELOGRAM, URETEROSCOPY, HOLMIUM LASER, STENT PLACEMENT;  Surgeon: Selma Donnice SAUNDERS, MD;  Location: [facility];  Service: Urology;  Laterality: Right;  ONLY NEEDS 60 MIN   FRACTURE SURGERY Left 2020   dislocated left elbow    IR NEPHROSTOMY PLACEMENT RIGHT  07/10/2021   KNEE ARTHROSCOPY WITH EXCISION BAKER'S CYST     KNEE SURGERY     1967   LITHOTRIPSY  2008    Social History   Socioeconomic History   Marital status: Married    Spouse name: Not on file   Number of children: Not on file   Years of education: Not on file   Highest education level: 12th grade  Occupational History   Not on file  Tobacco Use   Smoking status: Never   Smokeless tobacco: Never   Tobacco comments:    Never smoked 02/13/24  Vaping Use   Vaping status: Never Used  Substance and Sexual Activity   Alcohol  use: Yes    Alcohol /week: 1.0 standard drink of alcohol     Types: 1 Standard drinks or equivalent per week    Comment: socially   Drug use: Not Currently    Types: Marijuana    Comment: LSD stopped 1969   Sexual  activity: Not on file  Other Topics Concern   Not on file  Social History Narrative   Not on file   Social Drivers of Health   Financial Resource Strain: Low Risk  (05/01/2024)   Overall Financial Resource Strain (CARDIA)    Difficulty of Paying Living Expenses: Not very hard  Food Insecurity: No Food Insecurity (05/01/2024)   Hunger Vital Sign    Worried About Running Out of Food in the Last Year: Never true    Ran Out of Food in the Last Year: Never true  Transportation Needs: No Transportation Needs  (05/01/2024)   PRAPARE - Administrator, Civil Service (Medical): No    Lack of Transportation (Non-Medical): No  Physical Activity: Insufficiently Active (05/01/2024)   Exercise Vital Sign    Days of Exercise per Week: 1 day    Minutes of Exercise per Session: 10 min  Stress: Stress Concern Present (05/01/2024)   [employer] of Occupational Health - Occupational Stress Questionnaire    Feeling of Stress: To some extent  Social Connections: Moderately Isolated (05/01/2024)   Social Connection and Isolation Panel    Frequency of Communication with Friends and Family: Three times a week    Frequency of Social Gatherings with Friends and Family: Once a week    Attends Religious Services: Never    Database Administrator or Organizations: No    Attends Engineer, Structural: Not on file    Marital Status: Married  Catering Manager Violence: Not At Risk (03/16/2023)   Humiliation, Afraid, Rape, and Kick questionnaire    Fear of Current or Ex-Partner: No    Emotionally Abused: No    Physically Abused: No    Sexually Abused: No    Family History  Problem Relation Age of Onset   Stroke Mother    Stroke Father    Cancer Sister    Depression Daughter    Migraines Daughter    Colon cancer Neg Hx    Esophageal cancer Neg Hx    Rectal cancer Neg Hx    Stomach cancer Neg Hx     Current Outpatient Medications on File Prior to Visit  Medication Sig Dispense Refill   carvedilol  (COREG ) 6.25 MG tablet TAKE 1 TABLET BY MOUTH TWICE A DAY WITH FOOD 180 tablet 1   clonazePAM  (KLONOPIN ) 0.5 MG tablet Take 1 tablet (0.5 mg total) by mouth 3 (three) times daily as needed for anxiety. (Patient taking differently: Take 0.25 mg by mouth at bedtime. Take take a 0.25 mg dose during the day as needed for anxiety) 90 tablet 1   ELIQUIS  5 MG TABS tablet TAKE 1 TABLET BY MOUTH TWICE A DAY 180 tablet 1   famotidine  (PEPCID ) 20 MG tablet Take 20 mg by mouth daily.      FLUoxetine   (PROZAC ) 40 MG capsule TAKE 1 CAPSULE (40 MG TOTAL) BY MOUTH DAILY. 90 capsule 1   Red Yeast Rice 600 MG CAPS Take 600 mg by mouth 2 (two) times daily.     No current facility-administered medications on file prior to visit.    Allergies  Allergen Reactions   Clindamycin/Lincomycin Nausea And Vomiting   Lovastatin Other (See Comments)    Fatigue, muscle aches   Penicillins Rash    Tolerated Rocephin  07/10/21       Physical Exam Vitals requested from patient and listed below if patient had equipment and was able to obtain at home for this virtual visit: There were no  vitals filed for this visit. Estimated body mass index is 28.25 kg/m as calculated from the following:   Height as of 04/16/24: 5' 6 (1.676 m).   Weight as of this encounter: 175 lb (79.4 kg).  EKG (optional): deferred due to virtual visit  GENERAL: alert, oriented, no acute distress detected, full vision exam deferred due to pandemic and/or virtual encounter   HEENT: atraumatic, conjunttiva clear, no obvious abnormalities on inspection of external nose and ears  NECK: normal movements of the head and neck  LUNGS: on inspection no signs of respiratory distress, breathing rate appears normal, no obvious gross SOB, gasping or wheezing  CV: no obvious cyanosis  MS: moves all visible extremities without noticeable abnormality  PSYCH/NEURO: pleasant and cooperative, no obvious depression or anxiety, speech and thought processing grossly intact, Cognitive function grossly intact  Flowsheet Row Office Visit from 08/03/2023 in [facility] [place]  PHQ-9 Total Score 12        05/01/2024   10:05 AM 08/03/2023    7:47 AM 03/16/2023    8:41 AM 01/13/2023   10:03 AM 05/14/2021   10:20 AM  Depression screen PHQ 2/9  Decreased Interest 0 1 0 0 0  Down, Depressed, Hopeless 0 1 0 0 0  PHQ - 2 Score 0 2 0 0 0  Altered sleeping  3  2 2   Tired, decreased energy  2  1 1   Change in appetite  3  0 1   Feeling bad or failure about yourself   0  0 0  Trouble concentrating  2  0 0  Moving slowly or fidgety/restless  0  0 0  Suicidal thoughts  0   0  PHQ-9 Score  12  3 4   Difficult doing work/chores  Somewhat difficult  Not difficult at all Not difficult at all  Admits to some mild depression, on medication, stable and not severe symptoms - more upset by news and has sick spouse she is caring for.     07/27/2021   11:14 AM 01/13/2023   10:02 AM 03/16/2023    8:42 AM 05/01/2024    9:37 AM 05/01/2024   10:05 AM  Fall Risk  Falls in the past year?  0 1 0 0  Was there an injury with Fall?  0 1  0  Was there an injury with Fall? - Comments   Rt ankle injury. Followed by medical attention    Fall Risk Category Calculator  0 2  0  (RETIRED) Patient Fall Risk Level Moderate fall risk       Patient at Risk for Falls Due to  No Fall Risks No Fall Risks    Fall risk Follow up  Falls evaluation completed Falls prevention discussed  Falls evaluation completed;Education provided;Falls prevention discussed     Data saved with a previous flowsheet row definition     SUMMARY AND PLAN:  Encounter for Medicare annual wellness exam   Discussed applicable health maintenance/preventive health measures and advised and referred or ordered per patient preferences: -declines bone density test, reports was on treatment for 5 years and then stopped and doe snot wish to do further -had covid vaccine - update in chart -discussed metabolic labs and she plans to do at upcoming physical in office Health Maintenance  Topic Date Due   COVID-19 Vaccine (8 - Pfizer risk 2025-26 season) 10/12/2024   Medicare Annual Wellness (AWV)  05/01/2025   DTaP/Tdap/Td (4 - Td or Tdap) 10/19/2028  Pneumococcal Vaccine: 50+ Years  Completed   Influenza Vaccine  Completed   DEXA SCAN  Completed   Hepatitis C Screening  Completed   Zoster Vaccines- Shingrix  Completed   Meningococcal B Vaccine  Aged Out   Mammogram   Discontinued   Colonoscopy  Discontinued      Education and counseling on the following was provided based on the above review of health and a plan/checklist for the patient, along with additional information discussed, was provided for the patient in the patient instructions :  -Provided counseling and plan for increased risk of falling if applicable per above screening - she admits to balance issues but no fall hx. Reviewed and demonstrated safe balance exercises that can be done at home to improve balance and discussed exercise guidelines for adults with include balance exercises at least 3 days per week.  -Advised and counseled on a healthy lifestyle - including the importance of a healthy diet, regular physical activity -discussed mild depression and lifestyle changes that could help to improve mood , counseling info provided - see pt instructions -Reviewed patient's current diet. Advised and counseled on a whole foods based healthy diet. A summary of a healthy diet was provided in the Patient Instructions.  -reviewed patient's current physical activity level and discussed exercise guidelines for adults. Discussed community resources and ideas for safe exercise at home to assist in meeting exercise guideline recommendations in a safe and healthy way.  -Advise yearly dental visits at minimum and regular eye exams -Advised and counseled on alcohol  safe limits  Follow up: see patient instructions     Patient Instructions  I really enjoyed getting to talk with you today! I am available on Tuesdays and Thursdays for virtual visits if you have any questions or concerns, or if I can be of any further assistance.   CHECKLIST FROM ANNUAL WELLNESS VISIT:  -Follow up (please call to schedule if not scheduled after visit):   -yearly for annual wellness visit with primary care office  Here is a list of your preventive care/health maintenance measures and the plan for each if any are due:  PLAN  For any measures below that may be due:   Health Maintenance  Topic Date Due   COVID-19 Vaccine (8 - Pfizer risk 2025-26 season) 10/12/2024   Medicare Annual Wellness (AWV)  05/01/2025   DTaP/Tdap/Td (4 - Td or Tdap) 10/19/2028   Pneumococcal Vaccine: 50+ Years  Completed   Influenza Vaccine  Completed   DEXA SCAN  Completed   Hepatitis C Screening  Completed   Zoster Vaccines- Shingrix  Completed   Meningococcal B Vaccine  Aged Out   Mammogram  Discontinued   Colonoscopy  Discontinued    -See a dentist at least yearly  -Get your eyes checked and then per your eye specialist's recommendation   -I have included below further information regarding a healthy whole foods based diet, physical activity guidelines for adults, stress management and opportunities for social connections. I hope you find this information useful.   -----------------------------------------------------------------------------------------------------------------------------------------------------------------------------------------------------------------------------------------------------------    NUTRITION: -eat real food: lots of colorful vegetables (half the plate) and fruits -5-7 servings of vegetables and fruits per day (fresh or steamed is best), exp. 2 servings of vegetables with lunch and dinner and 2 servings of fruit per day. Berries and greens such as kale and collards are great choices.  -consume on a regular basis:  fresh fruits, fresh veggies, fish, nuts, seeds, healthy oils (such as olive oil, avocado oil),  whole grains (make sure for bread/pasta/crackers/etc., that the first ingredient on label contains the word whole), legumes. -can eat small amounts of dairy and lean meat (no larger than the palm of your hand), but avoid processed meats such as ham, bacon, lunch meat, etc. -drink water -try to avoid fast food and pre-packaged foods, processed meat, ultra processed foods/beverages (donuts,  candy, etc.) -most experts advise limiting sodium to < 2300mg  per day, should limit further is any chronic conditions such as high blood pressure, heart disease, diabetes, etc. The American Heart Association advised that < 1500mg  is is ideal -try to avoid foods/beverages that contain any ingredients with names you do not recognize  -try to avoid foods/beverages  with added sugar or sweeteners/sweets  -try to avoid sweet drinks (including diet drinks): soda, juice, Gatorade, sweet tea, power drinks, diet drinks -try to avoid white rice, white bread, pasta (unless whole grain)  EXERCISE GUIDELINES FOR ADULTS: -if you wish to increase your physical activity, do so gradually and with the approval of your doctor -STOP and seek medical care immediately if you have any chest pain, chest discomfort or trouble breathing when starting or increasing exercise  -move and stretch your body, legs, feet and arms when sitting for long periods -Physical activity guidelines for optimal health in adults: -get at least 150 minutes per week of moderate exercise (can talk, but not sing); this is about 20-30 minutes of sustained activity 5-7 days per week or two 10-15 minute episodes of sustained activity 5-7 days per week -do some muscle building/resistance training/strength training at least 2 days per week  -balance exercises 3+ days per week:   Stand somewhere where you have something sturdy to hold onto if you lose balance    1) lift up on toes, then back down, start with 5x per day and work up to 20x   2) stand and lift one leg straight out to the side so that foot is a few inches of the floor, start with 5x each side and work up to 20x each side   3) stand on one foot, start with 5 seconds each side and work up to 20 seconds on each side  If you need ideas or help with getting more active:  -Silver sneakers https://tools.silversneakers.com  -Walk with a Doc: Http://www.duncan-williams.com/  -try to include  resistance (weight lifting/strength building) and balance exercises twice per week: or the following link for ideas: http://castillo-powell.com/  buyducts.dk  STRESS MANAGEMENT: -can try meditating, or just sitting quietly with deep breathing while intentionally relaxing all parts of your body for 5 minutes daily -if you need further help with stress, anxiety or depression please follow up with your primary doctor or contact the wonderful folks at [facility]: [phone]  SOCIAL CONNECTIONS: -options in [place] if you wish to engage in more social and exercise related activities:  -Silver sneakers https://tools.silversneakers.com  -Walk with a Doc: Http://www.duncan-williams.com/  -Check out the [facility] Active Adults 50+ section on the [place] of [employer] (hiking clubs, book clubs, cards and games, chess, exercise classes, aquatic classes and much more) - see the website for details: https://www.[place].gov/departments/parks-recreation/active-adults50  -YouTube has lots of exercise videos for different ages and abilities as well  -[facility] (a variety of indoor and outdoor inperson activities for adults). [phone]. [address].  -Virtual Online Classes (a variety of topics): see seniorplanet.org or call [phone]  -consider volunteering at a school, hospice center, church, senior center or elsewhere  Chiquita JONELLE Cramp, DO

## 2024-06-05 ENCOUNTER — Other Ambulatory Visit: Payer: Self-pay | Admitting: Adult Health

## 2024-06-05 DIAGNOSIS — F339 Major depressive disorder, recurrent, unspecified: Secondary | ICD-10-CM

## 2024-06-05 DIAGNOSIS — I4891 Unspecified atrial fibrillation: Secondary | ICD-10-CM

## 2024-07-25 ENCOUNTER — Encounter: Payer: Self-pay | Admitting: Adult Health

## 2024-07-25 ENCOUNTER — Ambulatory Visit: Admitting: Adult Health

## 2024-07-25 ENCOUNTER — Ambulatory Visit: Payer: Self-pay | Admitting: Adult Health

## 2024-07-25 VITALS — BP 120/80 | HR 60 | Temp 98.1°F | Wt 183.0 lb

## 2024-07-25 DIAGNOSIS — I482 Chronic atrial fibrillation, unspecified: Secondary | ICD-10-CM

## 2024-07-25 DIAGNOSIS — Z0000 Encounter for general adult medical examination without abnormal findings: Secondary | ICD-10-CM | POA: Diagnosis not present

## 2024-07-25 DIAGNOSIS — M810 Age-related osteoporosis without current pathological fracture: Secondary | ICD-10-CM | POA: Diagnosis not present

## 2024-07-25 DIAGNOSIS — F32A Depression, unspecified: Secondary | ICD-10-CM | POA: Diagnosis not present

## 2024-07-25 DIAGNOSIS — I10 Essential (primary) hypertension: Secondary | ICD-10-CM

## 2024-07-25 DIAGNOSIS — E782 Mixed hyperlipidemia: Secondary | ICD-10-CM | POA: Diagnosis not present

## 2024-07-25 DIAGNOSIS — F419 Anxiety disorder, unspecified: Secondary | ICD-10-CM

## 2024-07-25 LAB — COMPREHENSIVE METABOLIC PANEL WITH GFR
ALT: 13 U/L (ref 3–35)
AST: 16 U/L (ref 5–37)
Albumin: 4.3 g/dL (ref 3.5–5.2)
Alkaline Phosphatase: 77 U/L (ref 39–117)
BUN: 17 mg/dL (ref 6–23)
CO2: 27 meq/L (ref 19–32)
Calcium: 10.1 mg/dL (ref 8.4–10.5)
Chloride: 104 meq/L (ref 96–112)
Creatinine, Ser: 0.89 mg/dL (ref 0.40–1.20)
GFR: 62.83 mL/min
Glucose, Bld: 106 mg/dL — ABNORMAL HIGH (ref 70–99)
Potassium: 4.1 meq/L (ref 3.5–5.1)
Sodium: 137 meq/L (ref 135–145)
Total Bilirubin: 1 mg/dL (ref 0.2–1.2)
Total Protein: 7.5 g/dL (ref 6.0–8.3)

## 2024-07-25 LAB — VITAMIN D 25 HYDROXY (VIT D DEFICIENCY, FRACTURES): VITD: 23.39 ng/mL — ABNORMAL LOW (ref 30.00–100.00)

## 2024-07-25 LAB — CBC WITH DIFFERENTIAL/PLATELET
Basophils Absolute: 0.1 K/uL (ref 0.0–0.1)
Basophils Relative: 1.2 % (ref 0.0–3.0)
Eosinophils Absolute: 0.2 K/uL (ref 0.0–0.7)
Eosinophils Relative: 3.1 % (ref 0.0–5.0)
HCT: 44.7 % (ref 36.0–46.0)
Hemoglobin: 14.9 g/dL (ref 12.0–15.0)
Lymphocytes Relative: 30.3 % (ref 12.0–46.0)
Lymphs Abs: 1.5 K/uL (ref 0.7–4.0)
MCHC: 33.4 g/dL (ref 30.0–36.0)
MCV: 89.3 fl (ref 78.0–100.0)
Monocytes Absolute: 0.5 K/uL (ref 0.1–1.0)
Monocytes Relative: 9.8 % (ref 3.0–12.0)
Neutro Abs: 2.8 K/uL (ref 1.4–7.7)
Neutrophils Relative %: 55.6 % (ref 43.0–77.0)
Platelets: 280 K/uL (ref 150.0–400.0)
RBC: 5.01 Mil/uL (ref 3.87–5.11)
RDW: 13.6 % (ref 11.5–15.5)
WBC: 5 K/uL (ref 4.0–10.5)

## 2024-07-25 LAB — LIPID PANEL
Cholesterol: 253 mg/dL — ABNORMAL HIGH (ref 28–200)
HDL: 76 mg/dL
LDL Cholesterol: 159 mg/dL — ABNORMAL HIGH (ref 10–99)
NonHDL: 177.25
Total CHOL/HDL Ratio: 3
Triglycerides: 89 mg/dL (ref 10.0–149.0)
VLDL: 17.8 mg/dL (ref 0.0–40.0)

## 2024-07-25 LAB — TSH: TSH: 1.27 u[IU]/mL (ref 0.35–5.50)

## 2024-07-25 NOTE — Progress Notes (Signed)
 "  Subjective:    Patient ID: Colleen Reynolds, female    DOB: Aug 07, 1945, 79 y.o.   MRN: [id number]  HPI Patient presents for yearly preventative medicine examination. She is a pleasant 79 year old female who  has a past medical history of Anemia, Atrial fibrillation (HCC), Depression, Diverticulosis, GERD (gastroesophageal reflux disease), History of kidney stones, Hypertension, Kidney stones (2008), OA (osteoarthritis) of hip, osteoporosis, Shingles, and Vitamin D  deficiency.  Atrial Fibrillation -  managed by Cardiology. She is prescribed Coreg  6.25 mg BID for rate control and eliquis  5 mg BID. She did have an ablation done in July 2025 and has not have an reoccurance since. She was advised to continue Eliquis  5 mg for a CHA2DS2=VASc score of 4. She has decreased her dose to 2.5 mg daily as she does not want to be on a blood thinner.   Hypertension - well controlled with Coreg  6.25 mg BID. She has boarderline LVH on echo and a mildly dilated left atrium.  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 Her last lipid panel was in 2019.  Lab Results  Component Value Date   CHOL 242 (H) 03/23/2018   HDL 63.80 03/23/2018   LDLCALC 159 (H) 03/23/2018   TRIG 96.0 03/23/2018   CHOLHDL 4 03/23/2018    Osteoporosis - Was on Fosamax but stopped after five years. She has not been on anything since. Her last bone density was in 2016  Anxiety and Depression - takes Prozac  40 mg daily and klonopin  0.25 mg PRN. She does feel well controlled.     All immunizations and health maintenance protocols were reviewed with the patient and needed orders were placed.  Appropriate screening laboratory values were ordered for the patient including screening of hyperlipidemia, renal function and hepatic function.  Medication reconciliation,  past medical history, social history, problem list  and allergies were reviewed in detail with the patient  Goals were established with regard to weight loss, exercise, and  diet in compliance with medications Wt Readings from Last 3 Encounters:  07/25/24 183 lb (83 kg)  05/01/24 175 lb (79.4 kg)  04/16/24 179 lb (81.2 kg)   Review of Systems  Constitutional: Negative.   HENT: Negative.    Eyes: Negative.   Respiratory: Negative.    Cardiovascular: Negative.   Gastrointestinal: Negative.   Endocrine: Negative.   Genitourinary: Negative.   Musculoskeletal: Negative.   Skin: Negative.   Allergic/Immunologic: Negative.   Neurological: Negative.   Hematological: Negative.   Psychiatric/Behavioral: Negative.     Past Medical History:  Diagnosis Date   Anemia    Atrial fibrillation (HCC)    Depression    Diverticulosis    GERD (gastroesophageal reflux disease)    History of kidney stones    Hypertension    Kidney stones 2008   OA (osteoarthritis) of hip    osteoporosis    Shingles    Vitamin D  deficiency     Social History   Socioeconomic History   Marital status: Married    Spouse name: Not on file   Number of children: Not on file   Years of education: Not on file   Highest education level: 12th grade  Occupational History   Not on file  Tobacco Use   Smoking status: Never   Smokeless tobacco: Never   Tobacco comments:    Never smoked 02/13/24  Vaping Use   Vaping status: Never Used  Substance and Sexual Activity   Alcohol  use: Yes    Alcohol /week: 1.0 standard drink of alcohol     Types: 1 Standard drinks or equivalent per week    Comment: socially   Drug use: Not Currently    Types: Marijuana    Comment: LSD stopped 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   Sexual activity: Not on file  Other Topics Concern   Not on file  Social History Narrative   Not on file   Social Drivers of Health   Tobacco Use: Low Risk (07/25/2024)   Patient History    Smoking Tobacco Use: Never    Smokeless Tobacco Use: Never    Passive Exposure: Not on  file  Financial Resource Strain: Low Risk (05/01/2024)   Overall Financial Resource Strain (CARDIA)    Difficulty of Paying Living Expenses: Not very hard  Food Insecurity: No Food Insecurity (05/01/2024)   Epic    Worried About Programme Researcher, Broadcasting/film/video in the Last Year: Never true    Ran Out of Food in the Last Year: Never true  Transportation Needs: No Transportation Needs (05/01/2024)   Epic    Lack of Transportation (Medical): No    Lack of Transportation (Non-Medical): No  Physical Activity: Insufficiently Active (05/01/2024)   Exercise Vital Sign    Days of Exercise per Week: 1 day    Minutes of Exercise per Session: 10 min  Stress: Stress Concern Present (05/01/2024)   [employer] of Occupational Health - Occupational Stress Questionnaire    Feeling of Stress: To some extent  Social Connections: Moderately Isolated (05/01/2024)   Social Connection and Isolation Panel    Frequency of Communication with Friends and Family: Three times a week    Frequency of Social Gatherings with Friends and Family: Once a week    Attends Religious Services: Never    Database Administrator or Organizations: No    Attends Engineer, Structural: Not on file    Marital Status: Married  Catering Manager Violence: Not At Risk (03/16/2023)   Humiliation, Afraid, Rape, and Kick questionnaire    Fear of Current or Ex-Partner: No    Emotionally Abused: No    Physically Abused: No    Sexually Abused: No  Depression (PHQ2-9): Low Risk (07/25/2024)   Depression (PHQ2-9)    PHQ-2 Score: 1  Alcohol  Screen: Low Risk (05/01/2024)   Alcohol  Screen    Last Alcohol  Screening Score (AUDIT): 2  Housing: Low Risk (05/01/2024)   Epic    Unable to Pay for Housing in the Last Year: No    Number of Times Moved in the Last Year: 0    Homeless in the Last Year: No  Utilities: Not At Risk (03/16/2023)   [employer] Utilities    Threatened with loss of utilities: No  Health Literacy: Adequate Health Literacy  (03/16/2023)   B1300 Health Literacy    Frequency of need for help with medical instructions: Never    Past Surgical History:  Procedure Laterality Date   A-FLUTTER ABLATION N/A 01/11/2024   Procedure: A-FLUTTER ABLATION;  Surgeon: Mealor, Eulas BRAVO, MD;  Location: [facility];  Service: Cardiovascular;  Laterality: N/A;   ABDOMINAL HYSTERECTOMY     ATRIAL FIBRILLATION ABLATION N/A 01/11/2024   Procedure: ATRIAL FIBRILLATION ABLATION;  Surgeon: Nancey Eulas BRAVO, MD;  Location: [facility];  Service: Cardiovascular;  Laterality: N/A;   brachialrsadius puritis     BREAST REDUCTION SURGERY  1985  COLONOSCOPY  2005   CYSTOSCOPY W/ URETERAL STENT PLACEMENT Right 07/11/2021   Procedure: CYSTOSCOPY WITH RETROGRADE PYELOGRAM/URETERAL STENT PLACEMENT;  Surgeon: Cam Morene ORN, MD;  Location: [facility];  Service: Urology;  Laterality: Right;   CYSTOSCOPY/URETEROSCOPY/HOLMIUM LASER/STENT PLACEMENT Right 07/27/2021   Procedure: CYSTOSCOPY, RETROGRADE PYELOGRAM, URETEROSCOPY, HOLMIUM LASER, STENT PLACEMENT;  Surgeon: Selma Donnice SAUNDERS, MD;  Location: [facility];  Service: Urology;  Laterality: Right;  ONLY NEEDS 60 MIN   FRACTURE SURGERY Left 2020   dislocated left elbow    IR NEPHROSTOMY PLACEMENT RIGHT  07/10/2021   KNEE ARTHROSCOPY WITH EXCISION BAKER'S CYST     KNEE SURGERY     1967   LITHOTRIPSY  2008    Family History  Problem Relation Age of Onset   Stroke Mother    Stroke Father    Cancer Sister    Depression Daughter    Migraines Daughter    Colon cancer Neg Hx    Esophageal cancer Neg Hx    Rectal cancer Neg Hx    Stomach cancer Neg Hx     Allergies[1]  Medications Ordered Prior to Encounter[2]  BP 120/80   Pulse 60   Temp 98.1 F (36.7 C)   Wt 183 lb (83 kg)   SpO2 98%   BMI 29.54 kg/m       Objective:   Physical Exam Vitals and nursing note reviewed.  Constitutional:      General: She is not in acute distress.    Appearance: Normal appearance. She  is obese. She is not ill-appearing.  HENT:     Head: Normocephalic and atraumatic.     Right Ear: Tympanic membrane, ear canal and external ear normal. There is no impacted cerumen.     Left Ear: Tympanic membrane, ear canal and external ear normal. There is no impacted cerumen.     Nose: Nose normal. No congestion or rhinorrhea.     Mouth/Throat:     Mouth: Mucous membranes are moist.     Pharynx: Oropharynx is clear.  Eyes:     Extraocular Movements: Extraocular movements intact.     Conjunctiva/sclera: Conjunctivae normal.     Pupils: Pupils are equal, round, and reactive to light.  Neck:     Vascular: No carotid bruit.  Cardiovascular:     Rate and Rhythm: Normal rate and regular rhythm.     Pulses: Normal pulses.     Heart sounds: No murmur heard.    No friction rub. No gallop.  Pulmonary:     Effort: Pulmonary effort is normal.     Breath sounds: Normal breath sounds.  Abdominal:     General: Abdomen is flat. Bowel sounds are normal. There is no distension.     Palpations: Abdomen is soft. There is no mass.     Tenderness: There is no abdominal tenderness. There is no guarding or rebound.     Hernia: No hernia is present.  Musculoskeletal:        General: Normal range of motion.     Cervical back: Normal range of motion and neck supple.  Lymphadenopathy:     Cervical: No cervical adenopathy.  Skin:    General: Skin is warm and dry.     Capillary Refill: Capillary refill takes less than 2 seconds.  Neurological:     General: No focal deficit present.     Mental Status: She is alert and oriented to person, place, and time.  Psychiatric:  Mood and Affect: Mood normal.        Behavior: Behavior normal.        Thought Content: Thought content normal.        Judgment: Judgment normal.           Assessment & Plan:  1. Routine general medical examination at a health care facility (Primary) Today patient counseled on age appropriate routine health concerns for  screening and prevention, each reviewed and up to date or declined. Immunizations reviewed and up to date or declined. Labs ordered and reviewed. Risk factors for depression reviewed and negative. Hearing function and visual acuity are intact. ADLs screened and addressed as needed. Functional ability and level of safety reviewed and appropriate. Education, counseling and referrals performed based on assessed risks today. Patient provided with a copy of personalized plan for preventive services.   2. Atrial fibrillation, chronic (HCC) - Advised to take Eliquis  5 mg BID until seen by Cardiology  - CBC with Differential/Platelet; Future - Comprehensive metabolic panel with GFR; Future - Lipid panel; Future - TSH; Future - TSH - Lipid panel - Comprehensive metabolic panel with GFR - CBC with Differential/Platelet  3. Primary hypertension - Well controlled. No change in therapy  - CBC with Differential/Platelet; Future - Comprehensive metabolic panel with GFR; Future - Lipid panel; Future - TSH; Future - TSH - Lipid panel - Comprehensive metabolic panel with GFR - CBC with Differential/Platelet  4. Mixed hyperlipidemia - Consider statin  - CBC with Differential/Platelet; Future - Comprehensive metabolic panel with GFR; Future - Lipid panel; Future - TSH; Future - TSH - Lipid panel - Comprehensive metabolic panel with GFR - CBC with Differential/Platelet  5. Osteoporosis without current pathological fracture, unspecified osteoporosis type - Will order bone density screen and likely need referral to osteoporosis clinic  - CBC with Differential/Platelet; Future - Comprehensive metabolic panel with GFR; Future - Lipid panel; Future - TSH; Future - VITAMIN D  25 Hydroxy (Vit-D Deficiency, Fractures); Future - DG Bone Density; Future - VITAMIN D  25 Hydroxy (Vit-D Deficiency, Fractures) - TSH - Lipid panel - Comprehensive metabolic panel with GFR - CBC with  Differential/Platelet  6. Anxiety and depression - Well controlled. No change in medication  - CBC with Differential/Platelet; Future - Comprehensive metabolic panel with GFR; Future - Lipid panel; Future - TSH; Future - TSH - Lipid panel - Comprehensive metabolic panel with GFR - CBC with Differential/Platelet  Darleene Shape, NP     [1]  Allergies Allergen Reactions   Clindamycin/Lincomycin Nausea And Vomiting   Lovastatin Other (See Comments)    Fatigue, muscle aches   Penicillins Rash    Tolerated Rocephin  07/10/21  [2]  Current Outpatient Medications on File Prior to Visit  Medication Sig Dispense Refill   carvedilol  (COREG ) 6.25 MG tablet TAKE 1 TABLET BY MOUTH TWICE A DAY WITH FOOD 180 tablet 1   clonazePAM  (KLONOPIN ) 0.5 MG tablet Take 1 tablet (0.5 mg total) by mouth 3 (three) times daily as needed for anxiety. (Patient taking differently: Take 0.25 mg by mouth at bedtime. Take take a 0.25 mg dose during the day as needed for anxiety) 90 tablet 1   ELIQUIS  5 MG TABS tablet TAKE 1 TABLET BY MOUTH TWICE A DAY (Patient taking differently: Take 5 mg by mouth 2 (two) times daily. Pt taking 2.5mg  a day) 180 tablet 1   famotidine  (PEPCID ) 20 MG tablet Take 20 mg by mouth daily.      FLUoxetine  (PROZAC ) 40 MG capsule  TAKE 1 CAPSULE (40 MG TOTAL) BY MOUTH DAILY. 90 capsule 1   Red Yeast Rice 600 MG CAPS Take 600 mg by mouth 2 (two) times daily.     No current facility-administered medications on file prior to visit.   "

## 2024-07-25 NOTE — Patient Instructions (Addendum)
It was great seeing you today   We will follow up with you regarding your lab work   Please let me know if you need anything   Schedule your bone density test at check out desk. You may also call directly to X-ray at [phone] to schedule an appointment that is convenient for you.  - located [address] across the street from  - in the basement - you do need an appointment for the bone density tests.  

## 2024-07-26 MED ORDER — EZETIMIBE 10 MG PO TABS: 10.0000 mg | ORAL_TABLET | Freq: Every day | ORAL | 1 refills | Status: AC
